# Patient Record
Sex: Female | Born: 1935 | Race: White | Hispanic: No | Smoking: Never smoker
Health system: Southern US, Community
[De-identification: ages and names within clinical notes are randomized; demographics above are authoritative.]

## PROBLEM LIST (undated history)

## (undated) DIAGNOSIS — L570 Actinic keratosis: Secondary | ICD-10-CM

## (undated) DIAGNOSIS — K219 Gastro-esophageal reflux disease without esophagitis: Secondary | ICD-10-CM

## (undated) DIAGNOSIS — N8110 Cystocele, unspecified: Secondary | ICD-10-CM

## (undated) DIAGNOSIS — M1990 Unspecified osteoarthritis, unspecified site: Secondary | ICD-10-CM

## (undated) DIAGNOSIS — I10 Essential (primary) hypertension: Secondary | ICD-10-CM

## (undated) HISTORY — PX: INCISION AND DRAINAGE / EXCISION THYROGLOSSAL CYST: SUR667

## (undated) HISTORY — PX: ABDOMINAL ADHESION SURGERY: SHX90

## (undated) HISTORY — PX: BREAST CYST ASPIRATION: SHX578

## (undated) HISTORY — PX: COLONOSCOPY: SHX174

## (undated) HISTORY — PX: HYSTEROSCOPY: SHX211

## (undated) HISTORY — PX: CYST EXCISION: SHX5701

## (undated) HISTORY — PX: UPPER GASTROINTESTINAL ENDOSCOPY: SHX188

## (undated) HISTORY — DX: Actinic keratosis: L57.0

## (undated) HISTORY — PX: CARPAL TUNNEL RELEASE: SHX101

---

## 2005-06-22 ENCOUNTER — Ambulatory Visit: Payer: Self-pay | Admitting: Unknown Physician Specialty

## 2005-12-01 ENCOUNTER — Inpatient Hospital Stay: Payer: Self-pay | Admitting: Unknown Physician Specialty

## 2005-12-10 ENCOUNTER — Ambulatory Visit: Payer: Self-pay | Admitting: Otolaryngology

## 2006-01-04 ENCOUNTER — Ambulatory Visit: Payer: Self-pay | Admitting: Unknown Physician Specialty

## 2006-07-02 ENCOUNTER — Ambulatory Visit: Payer: Self-pay | Admitting: Unknown Physician Specialty

## 2007-03-18 ENCOUNTER — Emergency Department: Payer: Self-pay | Admitting: Emergency Medicine

## 2007-04-23 ENCOUNTER — Ambulatory Visit: Payer: Self-pay | Admitting: Unknown Physician Specialty

## 2007-04-23 LAB — HM COLONOSCOPY

## 2007-04-28 ENCOUNTER — Ambulatory Visit: Payer: Self-pay | Admitting: Unknown Physician Specialty

## 2007-07-31 ENCOUNTER — Ambulatory Visit: Payer: Self-pay | Admitting: Unknown Physician Specialty

## 2008-08-24 ENCOUNTER — Ambulatory Visit: Payer: Self-pay | Admitting: Unknown Physician Specialty

## 2008-08-31 DIAGNOSIS — D229 Melanocytic nevi, unspecified: Secondary | ICD-10-CM

## 2008-08-31 HISTORY — DX: Melanocytic nevi, unspecified: D22.9

## 2009-01-11 ENCOUNTER — Ambulatory Visit: Payer: Self-pay | Admitting: Specialist

## 2009-08-25 ENCOUNTER — Ambulatory Visit: Payer: Self-pay | Admitting: Unknown Physician Specialty

## 2010-08-31 ENCOUNTER — Ambulatory Visit: Payer: Self-pay | Admitting: Unknown Physician Specialty

## 2011-03-12 LAB — LIPID PANEL
Cholesterol: 182 mg/dL (ref 0–200)
HDL: 100 mg/dL — AB (ref 35–70)
LDL CALC: 72 mg/dL
LDl/HDL Ratio: 0.7
Triglycerides: 49 mg/dL (ref 40–160)

## 2011-09-24 ENCOUNTER — Ambulatory Visit: Payer: Self-pay | Admitting: Unknown Physician Specialty

## 2012-02-13 DIAGNOSIS — H903 Sensorineural hearing loss, bilateral: Secondary | ICD-10-CM | POA: Diagnosis not present

## 2012-02-26 DIAGNOSIS — H903 Sensorineural hearing loss, bilateral: Secondary | ICD-10-CM | POA: Diagnosis not present

## 2012-03-29 ENCOUNTER — Inpatient Hospital Stay: Payer: Self-pay | Admitting: Surgery

## 2012-03-29 DIAGNOSIS — E860 Dehydration: Secondary | ICD-10-CM | POA: Diagnosis not present

## 2012-03-29 DIAGNOSIS — Z888 Allergy status to other drugs, medicaments and biological substances status: Secondary | ICD-10-CM | POA: Diagnosis not present

## 2012-03-29 DIAGNOSIS — K219 Gastro-esophageal reflux disease without esophagitis: Secondary | ICD-10-CM | POA: Diagnosis not present

## 2012-03-29 DIAGNOSIS — R109 Unspecified abdominal pain: Secondary | ICD-10-CM | POA: Diagnosis not present

## 2012-03-29 DIAGNOSIS — Z881 Allergy status to other antibiotic agents status: Secondary | ICD-10-CM | POA: Diagnosis not present

## 2012-03-29 DIAGNOSIS — K56609 Unspecified intestinal obstruction, unspecified as to partial versus complete obstruction: Secondary | ICD-10-CM | POA: Diagnosis not present

## 2012-03-29 DIAGNOSIS — R112 Nausea with vomiting, unspecified: Secondary | ICD-10-CM | POA: Diagnosis not present

## 2012-03-29 DIAGNOSIS — Z8249 Family history of ischemic heart disease and other diseases of the circulatory system: Secondary | ICD-10-CM | POA: Diagnosis not present

## 2012-03-29 DIAGNOSIS — I10 Essential (primary) hypertension: Secondary | ICD-10-CM | POA: Diagnosis not present

## 2012-03-29 LAB — URINALYSIS, COMPLETE
Bacteria: NONE SEEN
Glucose,UR: NEGATIVE mg/dL (ref 0–75)
Nitrite: NEGATIVE
Ph: 6 (ref 4.5–8.0)
Protein: NEGATIVE
RBC,UR: <1 /HPF (ref 0–5)
Specific Gravity: 1.008 (ref 1.003–1.030)
Transitional Epi: <1
WBC UR: 3 /HPF (ref 0–5)

## 2012-03-29 LAB — LIPASE, BLOOD: Lipase: 140 U/L (ref 73–393)

## 2012-03-29 LAB — COMPREHENSIVE METABOLIC PANEL
Albumin: 4.4 g/dL (ref 3.4–5.0)
Anion Gap: 11 (ref 7–16)
Bilirubin,Total: 1.9 mg/dL — ABNORMAL HIGH (ref 0.2–1.0)
Calcium, Total: 10.1 mg/dL (ref 8.5–10.1)
Chloride: 96 mmol/L — ABNORMAL LOW (ref 98–107)
Creatinine: 0.89 mg/dL (ref 0.60–1.30)
EGFR (African American): >60
EGFR (Non-African Amer.): >60
Potassium: 3.7 mmol/L (ref 3.5–5.1)
SGOT(AST): 27 U/L (ref 15–37)

## 2012-03-29 LAB — CBC
HCT: 42.4 % (ref 35.0–47.0)
MCH: 31 pg (ref 26.0–34.0)
Platelet: 243 10*3/uL (ref 150–440)
RBC: 4.74 10*6/uL (ref 3.80–5.20)

## 2012-03-29 LAB — CK TOTAL AND CKMB (NOT AT ARMC): CK, Total: 52 U/L (ref 21–215)

## 2012-03-30 LAB — BASIC METABOLIC PANEL
Anion Gap: 8 (ref 7–16)
BUN: 12 mg/dL (ref 7–18)
Co2: 27 mmol/L (ref 21–32)
EGFR (Non-African Amer.): >60
Potassium: 3.3 mmol/L — ABNORMAL LOW (ref 3.5–5.1)
Sodium: 137 mmol/L (ref 136–145)

## 2012-04-03 ENCOUNTER — Ambulatory Visit: Payer: Self-pay | Admitting: Surgery

## 2012-04-03 DIAGNOSIS — R109 Unspecified abdominal pain: Secondary | ICD-10-CM | POA: Diagnosis not present

## 2012-04-03 DIAGNOSIS — R112 Nausea with vomiting, unspecified: Secondary | ICD-10-CM | POA: Diagnosis not present

## 2012-04-07 DIAGNOSIS — R1084 Generalized abdominal pain: Secondary | ICD-10-CM | POA: Diagnosis not present

## 2012-04-10 DIAGNOSIS — K56609 Unspecified intestinal obstruction, unspecified as to partial versus complete obstruction: Secondary | ICD-10-CM | POA: Diagnosis not present

## 2012-04-10 DIAGNOSIS — M129 Arthropathy, unspecified: Secondary | ICD-10-CM | POA: Diagnosis not present

## 2012-04-10 DIAGNOSIS — I10 Essential (primary) hypertension: Secondary | ICD-10-CM | POA: Diagnosis not present

## 2012-04-10 DIAGNOSIS — K589 Irritable bowel syndrome without diarrhea: Secondary | ICD-10-CM | POA: Diagnosis not present

## 2012-05-22 DIAGNOSIS — L0201 Cutaneous abscess of face: Secondary | ICD-10-CM | POA: Diagnosis not present

## 2012-05-22 DIAGNOSIS — L03211 Cellulitis of face: Secondary | ICD-10-CM | POA: Diagnosis not present

## 2012-05-27 DIAGNOSIS — L03211 Cellulitis of face: Secondary | ICD-10-CM | POA: Diagnosis not present

## 2012-05-27 DIAGNOSIS — L0201 Cutaneous abscess of face: Secondary | ICD-10-CM | POA: Diagnosis not present

## 2012-06-09 DIAGNOSIS — J309 Allergic rhinitis, unspecified: Secondary | ICD-10-CM | POA: Diagnosis not present

## 2012-06-09 DIAGNOSIS — I10 Essential (primary) hypertension: Secondary | ICD-10-CM | POA: Diagnosis not present

## 2012-06-09 DIAGNOSIS — K589 Irritable bowel syndrome without diarrhea: Secondary | ICD-10-CM | POA: Diagnosis not present

## 2012-06-09 DIAGNOSIS — M1990 Unspecified osteoarthritis, unspecified site: Secondary | ICD-10-CM | POA: Diagnosis not present

## 2012-06-10 DIAGNOSIS — R1084 Generalized abdominal pain: Secondary | ICD-10-CM | POA: Diagnosis not present

## 2012-06-18 DIAGNOSIS — R1084 Generalized abdominal pain: Secondary | ICD-10-CM | POA: Diagnosis not present

## 2012-08-05 DIAGNOSIS — Z124 Encounter for screening for malignant neoplasm of cervix: Secondary | ICD-10-CM | POA: Diagnosis not present

## 2012-08-05 DIAGNOSIS — Z01419 Encounter for gynecological examination (general) (routine) without abnormal findings: Secondary | ICD-10-CM | POA: Diagnosis not present

## 2012-09-30 ENCOUNTER — Ambulatory Visit: Payer: Self-pay | Admitting: Family Medicine

## 2012-09-30 DIAGNOSIS — Z1231 Encounter for screening mammogram for malignant neoplasm of breast: Secondary | ICD-10-CM | POA: Diagnosis not present

## 2012-11-04 DIAGNOSIS — Z23 Encounter for immunization: Secondary | ICD-10-CM | POA: Diagnosis not present

## 2012-11-04 DIAGNOSIS — J309 Allergic rhinitis, unspecified: Secondary | ICD-10-CM | POA: Diagnosis not present

## 2012-11-04 DIAGNOSIS — K589 Irritable bowel syndrome without diarrhea: Secondary | ICD-10-CM | POA: Diagnosis not present

## 2012-11-04 DIAGNOSIS — Z0000 Encounter for general adult medical examination without abnormal findings: Secondary | ICD-10-CM | POA: Diagnosis not present

## 2012-11-04 DIAGNOSIS — M1990 Unspecified osteoarthritis, unspecified site: Secondary | ICD-10-CM | POA: Diagnosis not present

## 2012-11-19 DIAGNOSIS — H2510 Age-related nuclear cataract, unspecified eye: Secondary | ICD-10-CM | POA: Diagnosis not present

## 2012-11-28 DIAGNOSIS — K589 Irritable bowel syndrome without diarrhea: Secondary | ICD-10-CM | POA: Diagnosis not present

## 2012-11-28 DIAGNOSIS — Z23 Encounter for immunization: Secondary | ICD-10-CM | POA: Diagnosis not present

## 2012-11-28 DIAGNOSIS — R1032 Left lower quadrant pain: Secondary | ICD-10-CM | POA: Diagnosis not present

## 2013-03-17 DIAGNOSIS — L723 Sebaceous cyst: Secondary | ICD-10-CM | POA: Diagnosis not present

## 2013-03-17 DIAGNOSIS — L923 Foreign body granuloma of the skin and subcutaneous tissue: Secondary | ICD-10-CM | POA: Diagnosis not present

## 2013-04-11 IMAGING — CT CT ABD-PELV W/O CM
1 of 2 series · 15 of 32 positions shown, 19 images · non-contrast
Comparison: None

REASON FOR EXAM: (1) diffuse abdominal pain, nausea/vomiting.  History of
obstructions and cannot
COMMENTS:

PROCEDURE:     CT  - CT ABDOMEN AND PELVIS W[DATE]  [DATE]
RESULT:     Indication: Abdominal pain
TECHNIQUE: Multiple axial images from the lung bases to the symphysis pubis
were obtained without oral and without intravenous contrast.

[Series 2: 3mm soft tissue · axial · 0.68mm/px · z∈[-456,-72]mm · 15 of 140 slices shown, 19 images]
[im 6/140  soft-tissue]
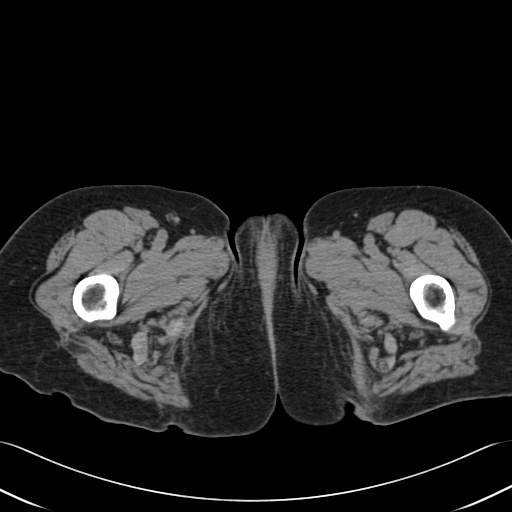
[im 6/140  bone]
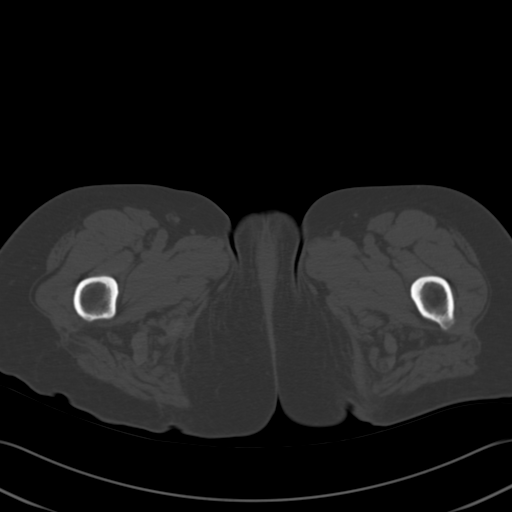
[im 18/140  soft-tissue]
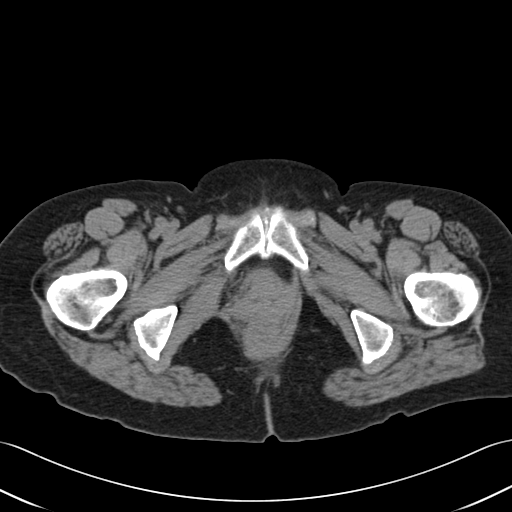
[im 29/140  soft-tissue]
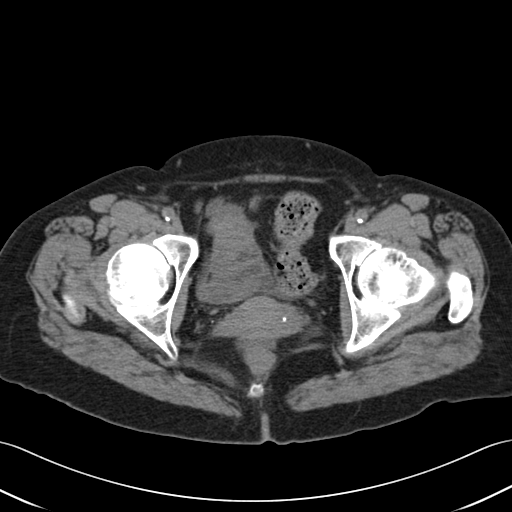
[im 41/140  soft-tissue]
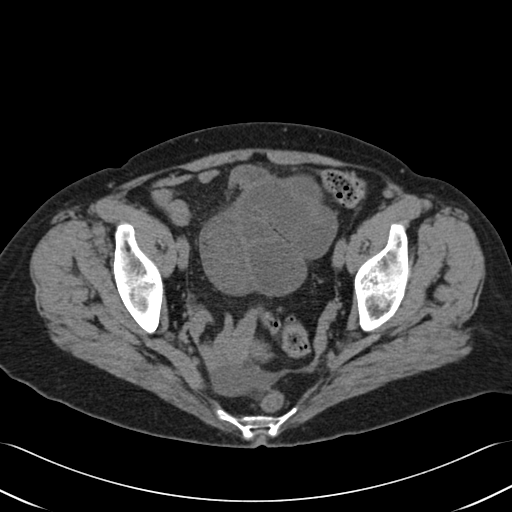
[im 47/140  soft-tissue]
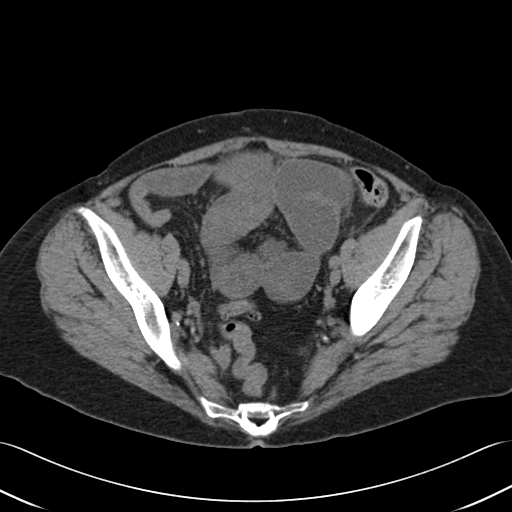
[im 58/140  soft-tissue]
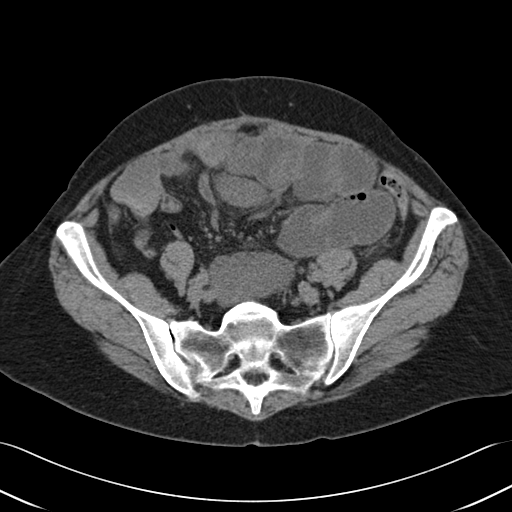
[im 70/140  soft-tissue]
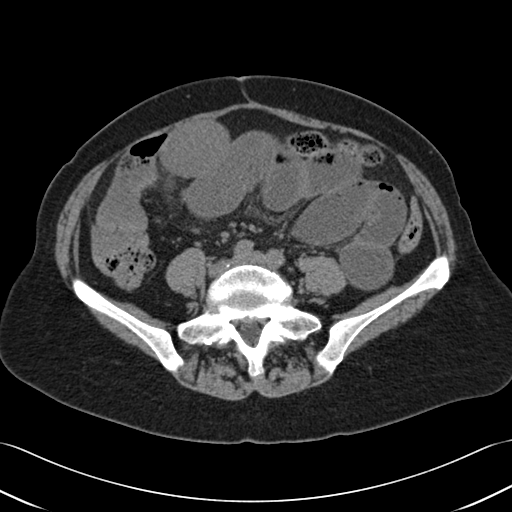
[im 82/140  soft-tissue]
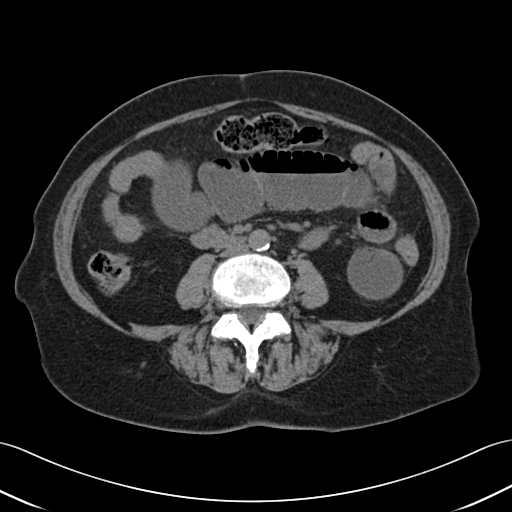
[im 93/140  soft-tissue]
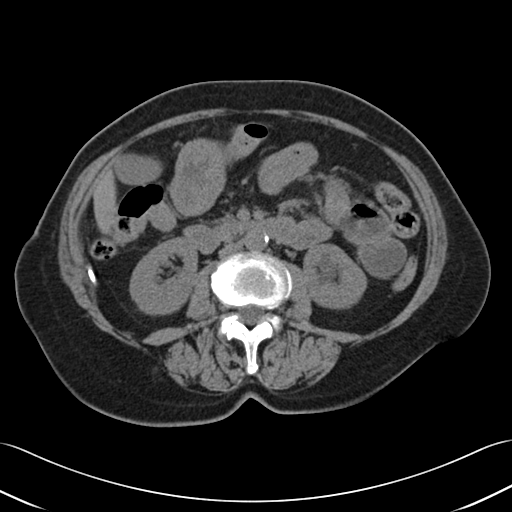
[im 93/140  bone]
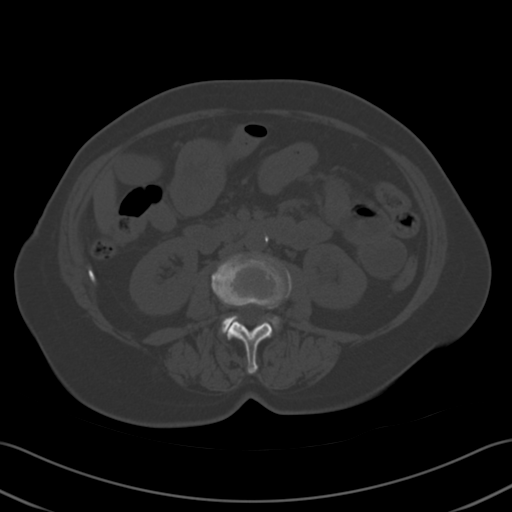
[im 99/140  soft-tissue]
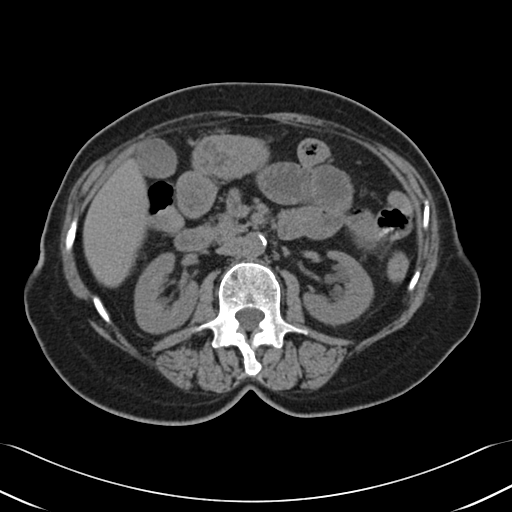
[im 111/140  soft-tissue]
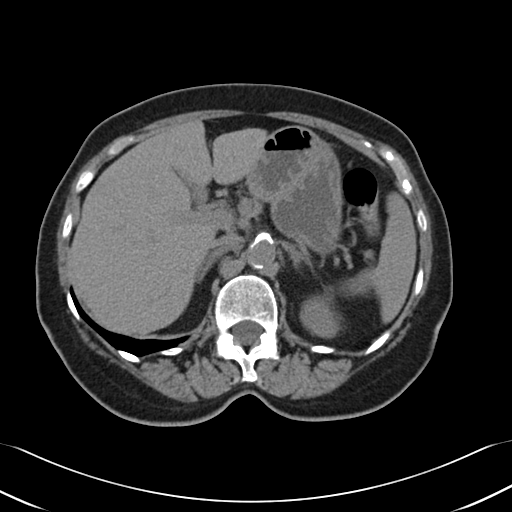
[im 116/140  lung]
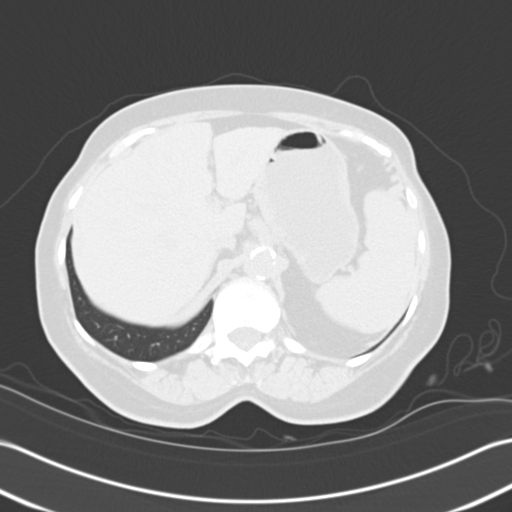
[im 122/140  soft-tissue]
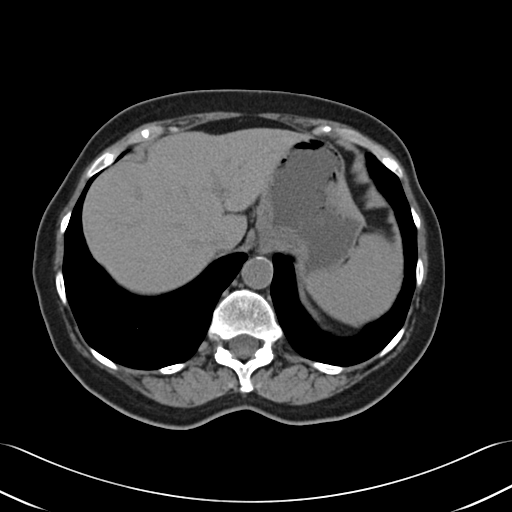
[im 122/140  lung]
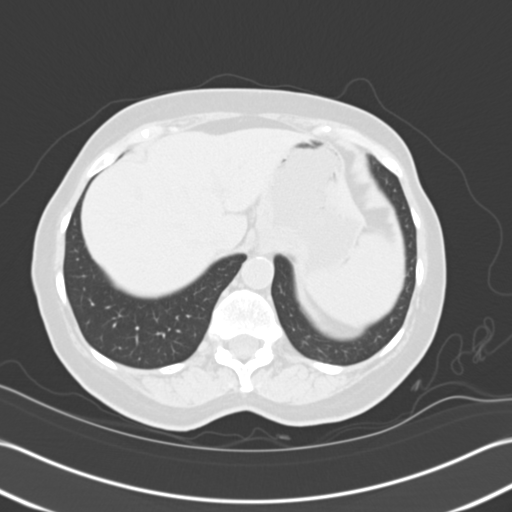
[im 128/140  lung]
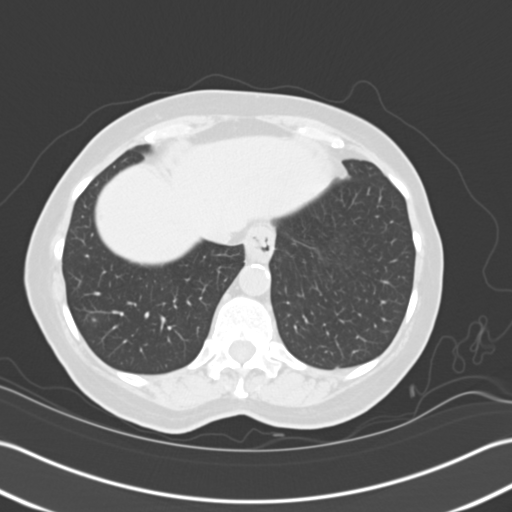
[im 134/140  soft-tissue]
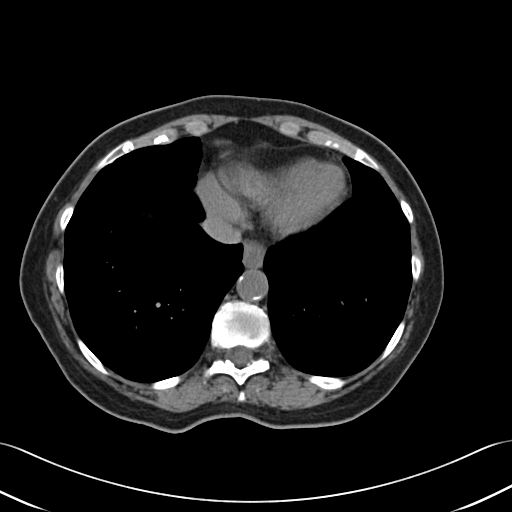
[im 134/140  lung]
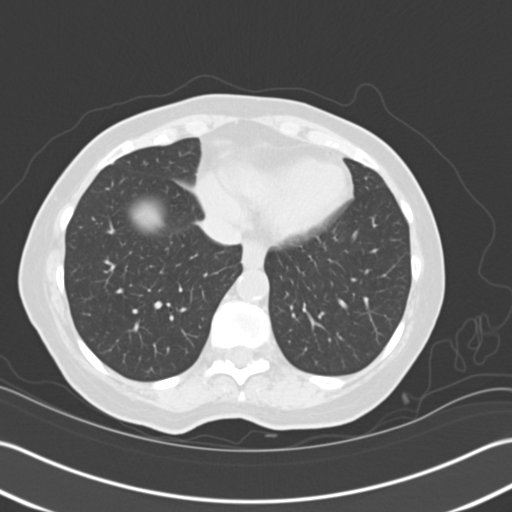

[15 of 32 positions shown; findings below may reference images not displayed]

FINDINGS: The lung bases are clear. There is no pleural or pericardial effusions.

No renal, ureteral, or bladder calculi. No obstructive uropathy. No
perinephric stranding is seen. There are left parapelvic cysts. The kidneys
are symmetric in size without evidence for exophytic mass. The bladder is
unremarkable.

The liver demonstrates no focal abnormality. The gallbladder is
unremarkable. The spleen demonstrates no focal abnormality. The adrenal
glands and pancreas are normal.

There is a small bowel dilatation measuring up to 4.2 cm with multiple
air-fluid levels and fluid filled loops of small bowel. There is no definite
transition point. The distal ileum is decompressed. There is no
pneumoperitoneum, pneumatosis, or portal venous gas. There is a small amount
of pelvic free fluid. There is no lymphadenopathy.

The abdominal aorta is normal in caliber .

The osseous structures are unremarkable.
IMPRESSION: 1. Small bowel dilatation with decompressed distal ileum most concerning for
a high-grade bowel obstruction.

[REDACTED]

## 2013-04-16 IMAGING — CR DG SMALL BOWEL
1 series · 9 of 9 positions shown · non-contrast
Comparison: none

REASON FOR EXAM: Abd Pain Nausea Vomiting
COMMENTS:

PROCEDURE:     FL  - FL SMALL BOWEL  - April 03, 2012 [DATE]
RESULT:     Indication: Nausea vomiting

[Series 1: run · 9 of 9 slices shown]
[im 1/9]
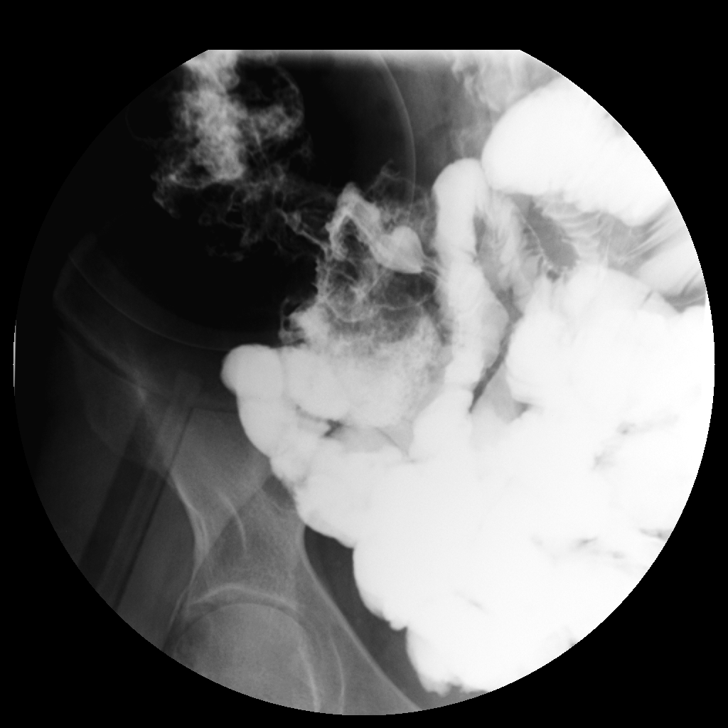
[im 2/9]
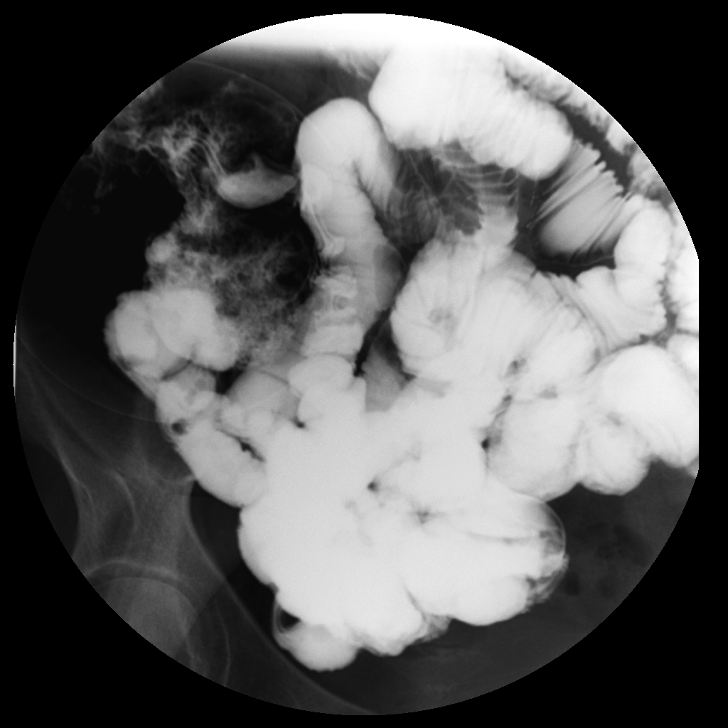
[im 3/9]
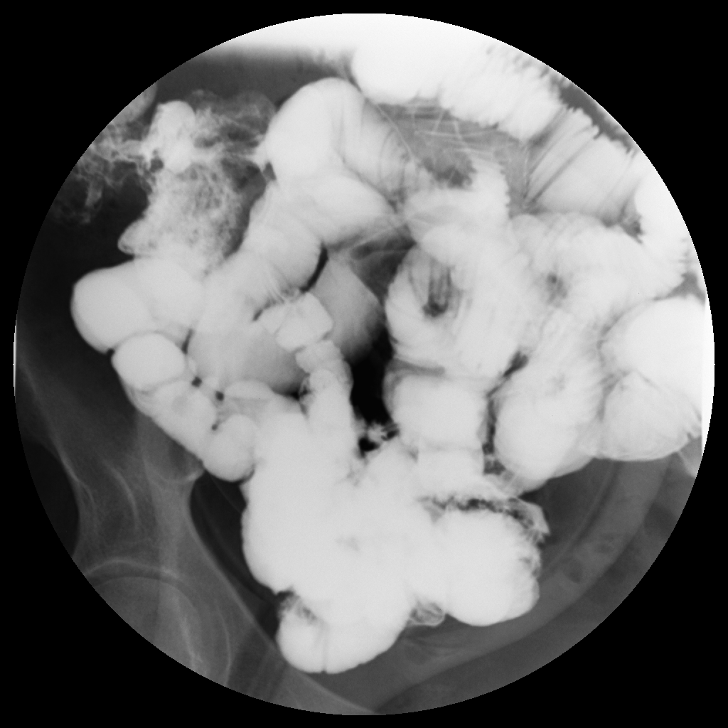
[im 4/9]
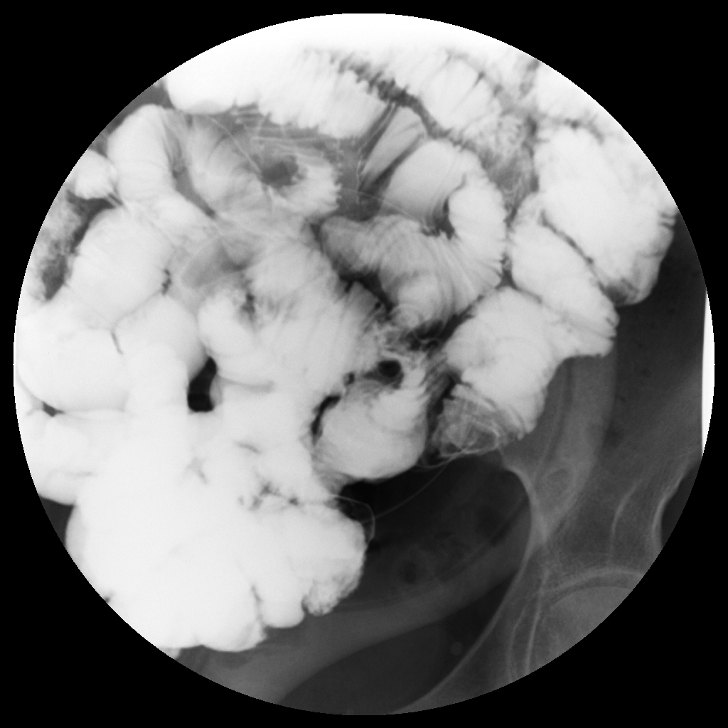
[im 5/9]
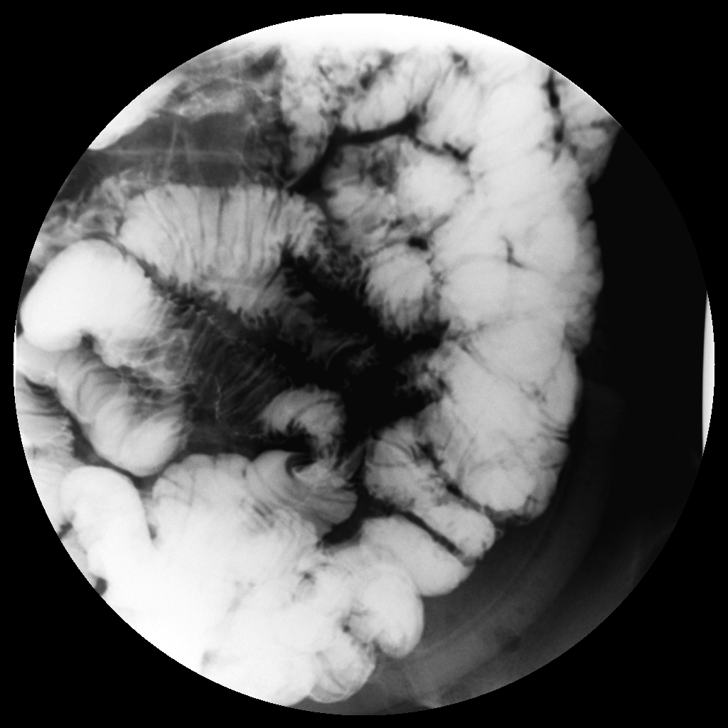
[im 6/9]
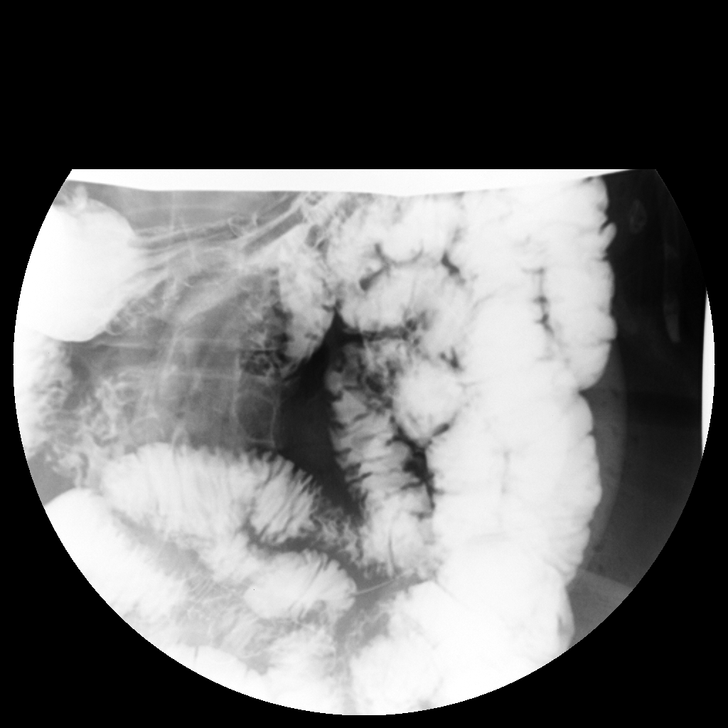
[im 7/9]
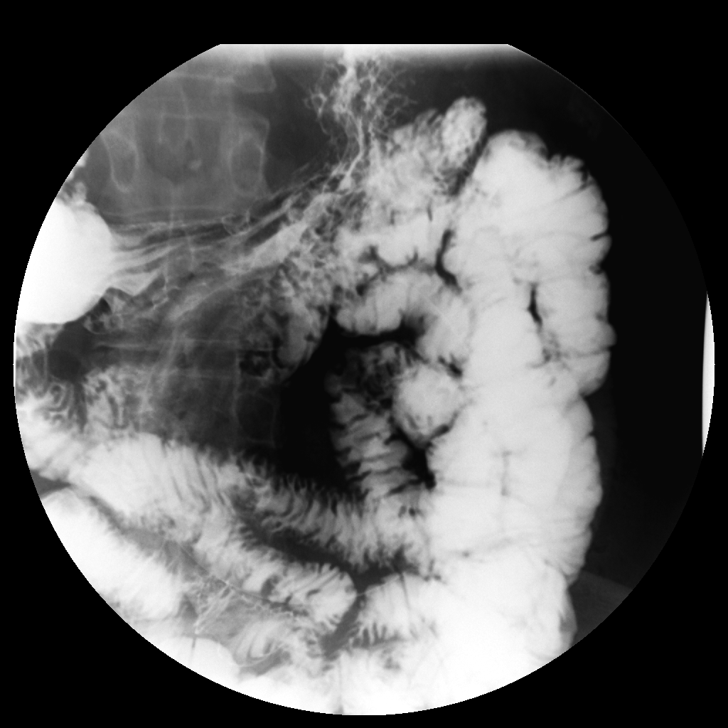
[im 8/9]
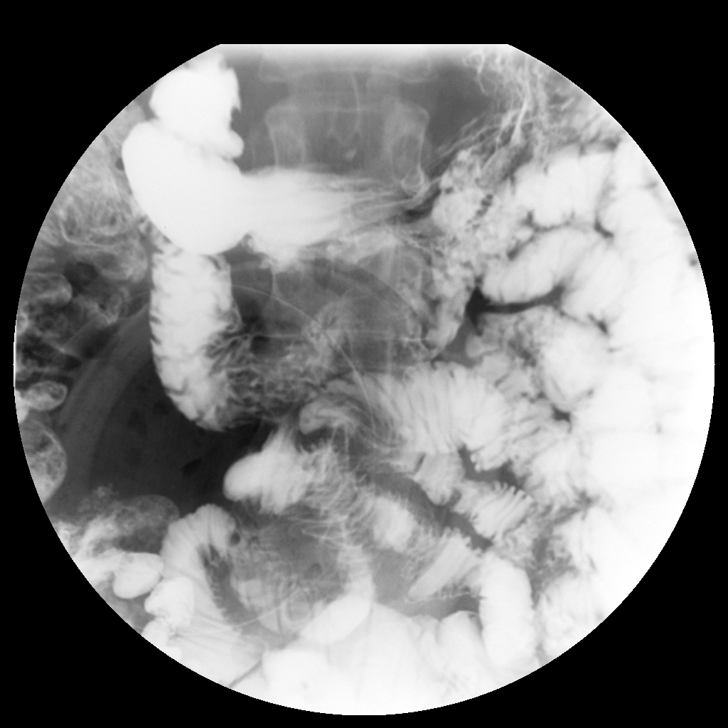
[im 9/9]
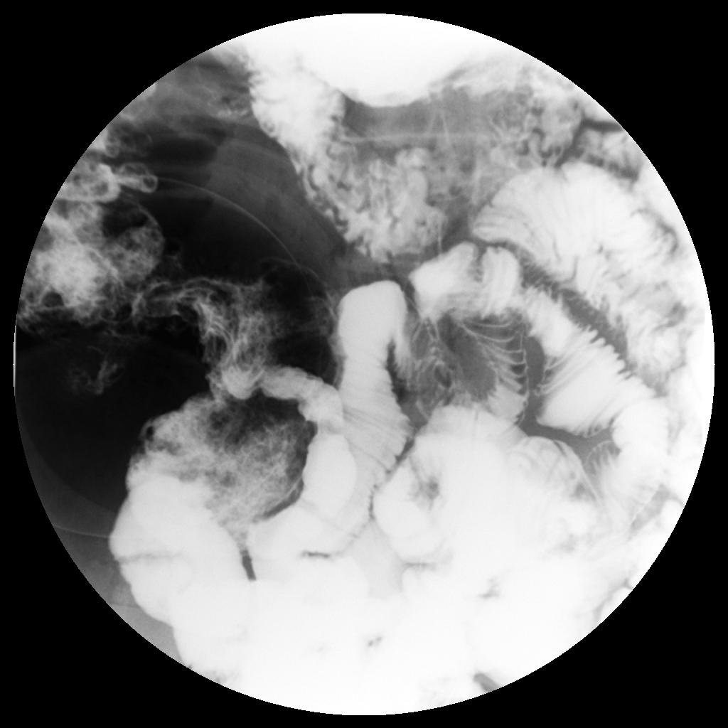

[9 of 9 positions shown; findings below may reference images not displayed]

FINDINGS: Scout frontal abdominal and pelvic radiograph demonstrates no bowel
dilatation.

Medium density barium was periodically observed under fluoroscopy to travel
from the stomach to the ascending colon (over a 90 minute time period).
There is no evidence of small bowel stricture or obstruction. No large
filling defects to suggest mass lesion. In addition, there is no evidence of
tethering or definite inflammatory changes present within the small bowel.
IMPRESSION: Normal small bowel follow-through study without evidence of tethering, mass
lesion, or obstruction within the small bowel.

[REDACTED]

## 2013-05-04 DIAGNOSIS — R51 Headache: Secondary | ICD-10-CM | POA: Diagnosis not present

## 2013-05-04 DIAGNOSIS — I10 Essential (primary) hypertension: Secondary | ICD-10-CM | POA: Diagnosis not present

## 2013-05-04 DIAGNOSIS — K589 Irritable bowel syndrome without diarrhea: Secondary | ICD-10-CM | POA: Diagnosis not present

## 2013-05-04 DIAGNOSIS — M129 Arthropathy, unspecified: Secondary | ICD-10-CM | POA: Diagnosis not present

## 2013-05-14 DIAGNOSIS — M76829 Posterior tibial tendinitis, unspecified leg: Secondary | ICD-10-CM | POA: Diagnosis not present

## 2013-07-15 DIAGNOSIS — D485 Neoplasm of uncertain behavior of skin: Secondary | ICD-10-CM | POA: Diagnosis not present

## 2013-07-15 DIAGNOSIS — L723 Sebaceous cyst: Secondary | ICD-10-CM | POA: Diagnosis not present

## 2013-08-02 ENCOUNTER — Emergency Department: Payer: Self-pay | Admitting: Emergency Medicine

## 2013-08-02 DIAGNOSIS — R112 Nausea with vomiting, unspecified: Secondary | ICD-10-CM | POA: Diagnosis not present

## 2013-08-02 DIAGNOSIS — I10 Essential (primary) hypertension: Secondary | ICD-10-CM | POA: Diagnosis not present

## 2013-08-02 DIAGNOSIS — Z79899 Other long term (current) drug therapy: Secondary | ICD-10-CM | POA: Diagnosis not present

## 2013-08-02 DIAGNOSIS — R1084 Generalized abdominal pain: Secondary | ICD-10-CM | POA: Diagnosis not present

## 2013-08-02 DIAGNOSIS — R109 Unspecified abdominal pain: Secondary | ICD-10-CM | POA: Diagnosis not present

## 2013-08-02 LAB — COMPREHENSIVE METABOLIC PANEL
Albumin: 4.6 g/dL (ref 3.4–5.0)
Anion Gap: 9 (ref 7–16)
BUN: 22 mg/dL — ABNORMAL HIGH (ref 7–18)
Calcium, Total: 10.9 mg/dL — ABNORMAL HIGH (ref 8.5–10.1)
Chloride: 100 mmol/L (ref 98–107)
Creatinine: 0.88 mg/dL (ref 0.60–1.30)
Osmolality: 279 (ref 275–301)
Potassium: 4.3 mmol/L (ref 3.5–5.1)
SGPT (ALT): 22 U/L (ref 12–78)
Total Protein: 8.7 g/dL — ABNORMAL HIGH (ref 6.4–8.2)

## 2013-08-02 LAB — CBC
HGB: 15 g/dL (ref 12.0–16.0)
MCH: 31.2 pg (ref 26.0–34.0)
MCHC: 35.5 g/dL (ref 32.0–36.0)
MCV: 88 fL (ref 80–100)
Platelet: 254 10*3/uL (ref 150–440)
RBC: 4.8 10*6/uL (ref 3.80–5.20)
WBC: 13.9 10*3/uL — ABNORMAL HIGH (ref 3.6–11.0)

## 2013-08-04 DIAGNOSIS — I789 Disease of capillaries, unspecified: Secondary | ICD-10-CM | POA: Diagnosis not present

## 2013-08-04 DIAGNOSIS — L578 Other skin changes due to chronic exposure to nonionizing radiation: Secondary | ICD-10-CM | POA: Diagnosis not present

## 2013-08-04 DIAGNOSIS — Z85828 Personal history of other malignant neoplasm of skin: Secondary | ICD-10-CM | POA: Diagnosis not present

## 2013-08-04 DIAGNOSIS — L820 Inflamed seborrheic keratosis: Secondary | ICD-10-CM | POA: Diagnosis not present

## 2013-08-04 DIAGNOSIS — D239 Other benign neoplasm of skin, unspecified: Secondary | ICD-10-CM | POA: Diagnosis not present

## 2013-08-04 DIAGNOSIS — D1800 Hemangioma unspecified site: Secondary | ICD-10-CM | POA: Diagnosis not present

## 2013-08-04 DIAGNOSIS — I679 Cerebrovascular disease, unspecified: Secondary | ICD-10-CM | POA: Diagnosis not present

## 2013-08-24 DIAGNOSIS — Z01419 Encounter for gynecological examination (general) (routine) without abnormal findings: Secondary | ICD-10-CM | POA: Diagnosis not present

## 2013-08-24 DIAGNOSIS — Z1211 Encounter for screening for malignant neoplasm of colon: Secondary | ICD-10-CM | POA: Diagnosis not present

## 2013-08-25 DIAGNOSIS — Z01419 Encounter for gynecological examination (general) (routine) without abnormal findings: Secondary | ICD-10-CM | POA: Diagnosis not present

## 2013-10-01 ENCOUNTER — Ambulatory Visit: Payer: Self-pay | Admitting: Unknown Physician Specialty

## 2013-10-01 DIAGNOSIS — Z1231 Encounter for screening mammogram for malignant neoplasm of breast: Secondary | ICD-10-CM | POA: Diagnosis not present

## 2013-10-14 DIAGNOSIS — I10 Essential (primary) hypertension: Secondary | ICD-10-CM | POA: Diagnosis not present

## 2013-10-14 DIAGNOSIS — Z23 Encounter for immunization: Secondary | ICD-10-CM | POA: Diagnosis not present

## 2013-10-14 DIAGNOSIS — R51 Headache: Secondary | ICD-10-CM | POA: Diagnosis not present

## 2013-10-14 DIAGNOSIS — K589 Irritable bowel syndrome without diarrhea: Secondary | ICD-10-CM | POA: Diagnosis not present

## 2013-10-14 DIAGNOSIS — M129 Arthropathy, unspecified: Secondary | ICD-10-CM | POA: Diagnosis not present

## 2013-11-10 DIAGNOSIS — R51 Headache: Secondary | ICD-10-CM | POA: Diagnosis not present

## 2013-11-10 DIAGNOSIS — Z0000 Encounter for general adult medical examination without abnormal findings: Secondary | ICD-10-CM | POA: Diagnosis not present

## 2013-11-10 DIAGNOSIS — Z1339 Encounter for screening examination for other mental health and behavioral disorders: Secondary | ICD-10-CM | POA: Diagnosis not present

## 2013-11-10 DIAGNOSIS — Z1331 Encounter for screening for depression: Secondary | ICD-10-CM | POA: Diagnosis not present

## 2013-11-10 DIAGNOSIS — Z23 Encounter for immunization: Secondary | ICD-10-CM | POA: Diagnosis not present

## 2013-11-10 DIAGNOSIS — K589 Irritable bowel syndrome without diarrhea: Secondary | ICD-10-CM | POA: Diagnosis not present

## 2013-11-10 DIAGNOSIS — I10 Essential (primary) hypertension: Secondary | ICD-10-CM | POA: Diagnosis not present

## 2013-11-13 DIAGNOSIS — H16219 Exposure keratoconjunctivitis, unspecified eye: Secondary | ICD-10-CM | POA: Diagnosis not present

## 2013-12-02 DIAGNOSIS — M6740 Ganglion, unspecified site: Secondary | ICD-10-CM | POA: Diagnosis not present

## 2013-12-10 DIAGNOSIS — H2510 Age-related nuclear cataract, unspecified eye: Secondary | ICD-10-CM | POA: Diagnosis not present

## 2014-05-19 DIAGNOSIS — I10 Essential (primary) hypertension: Secondary | ICD-10-CM | POA: Diagnosis not present

## 2014-05-19 DIAGNOSIS — Z23 Encounter for immunization: Secondary | ICD-10-CM | POA: Diagnosis not present

## 2014-05-19 DIAGNOSIS — K2100 Gastro-esophageal reflux disease with esophagitis, without bleeding: Secondary | ICD-10-CM | POA: Diagnosis not present

## 2014-05-19 DIAGNOSIS — Z79899 Other long term (current) drug therapy: Secondary | ICD-10-CM | POA: Diagnosis not present

## 2014-05-19 DIAGNOSIS — E041 Nontoxic single thyroid nodule: Secondary | ICD-10-CM | POA: Diagnosis not present

## 2014-07-16 DIAGNOSIS — H0019 Chalazion unspecified eye, unspecified eyelid: Secondary | ICD-10-CM | POA: Diagnosis not present

## 2014-08-05 DIAGNOSIS — D485 Neoplasm of uncertain behavior of skin: Secondary | ICD-10-CM | POA: Diagnosis not present

## 2014-08-05 DIAGNOSIS — L578 Other skin changes due to chronic exposure to nonionizing radiation: Secondary | ICD-10-CM | POA: Diagnosis not present

## 2014-08-05 DIAGNOSIS — L820 Inflamed seborrheic keratosis: Secondary | ICD-10-CM | POA: Diagnosis not present

## 2014-08-05 DIAGNOSIS — Z1283 Encounter for screening for malignant neoplasm of skin: Secondary | ICD-10-CM | POA: Diagnosis not present

## 2014-08-05 DIAGNOSIS — D239 Other benign neoplasm of skin, unspecified: Secondary | ICD-10-CM | POA: Diagnosis not present

## 2014-08-05 DIAGNOSIS — L570 Actinic keratosis: Secondary | ICD-10-CM | POA: Diagnosis not present

## 2014-08-05 DIAGNOSIS — Z85828 Personal history of other malignant neoplasm of skin: Secondary | ICD-10-CM | POA: Diagnosis not present

## 2014-09-07 DIAGNOSIS — N814 Uterovaginal prolapse, unspecified: Secondary | ICD-10-CM | POA: Diagnosis not present

## 2014-09-07 DIAGNOSIS — Z1211 Encounter for screening for malignant neoplasm of colon: Secondary | ICD-10-CM | POA: Diagnosis not present

## 2014-09-07 DIAGNOSIS — Z01419 Encounter for gynecological examination (general) (routine) without abnormal findings: Secondary | ICD-10-CM | POA: Diagnosis not present

## 2014-09-07 DIAGNOSIS — Z124 Encounter for screening for malignant neoplasm of cervix: Secondary | ICD-10-CM | POA: Diagnosis not present

## 2014-09-20 DIAGNOSIS — H6502 Acute serous otitis media, left ear: Secondary | ICD-10-CM | POA: Diagnosis not present

## 2014-09-20 DIAGNOSIS — J0140 Acute pansinusitis, unspecified: Secondary | ICD-10-CM | POA: Diagnosis not present

## 2014-09-20 DIAGNOSIS — L03211 Cellulitis of face: Secondary | ICD-10-CM | POA: Diagnosis not present

## 2014-09-20 DIAGNOSIS — R0981 Nasal congestion: Secondary | ICD-10-CM | POA: Diagnosis not present

## 2014-09-28 ENCOUNTER — Emergency Department: Payer: Self-pay | Admitting: Emergency Medicine

## 2014-09-28 DIAGNOSIS — G40909 Epilepsy, unspecified, not intractable, without status epilepticus: Secondary | ICD-10-CM | POA: Diagnosis not present

## 2014-09-28 DIAGNOSIS — Z7982 Long term (current) use of aspirin: Secondary | ICD-10-CM | POA: Diagnosis not present

## 2014-09-28 DIAGNOSIS — R569 Unspecified convulsions: Secondary | ICD-10-CM | POA: Diagnosis not present

## 2014-09-28 DIAGNOSIS — I10 Essential (primary) hypertension: Secondary | ICD-10-CM | POA: Diagnosis not present

## 2014-09-28 DIAGNOSIS — Z880 Allergy status to penicillin: Secondary | ICD-10-CM | POA: Diagnosis not present

## 2014-09-28 DIAGNOSIS — R42 Dizziness and giddiness: Secondary | ICD-10-CM | POA: Diagnosis not present

## 2014-09-29 DIAGNOSIS — R569 Unspecified convulsions: Secondary | ICD-10-CM | POA: Diagnosis not present

## 2014-09-29 LAB — URINALYSIS, COMPLETE
BILIRUBIN, UR: NEGATIVE
BLOOD: NEGATIVE
Bacteria: NONE SEEN
GLUCOSE, UR: NEGATIVE mg/dL (ref 0–75)
Ketone: NEGATIVE
Leukocyte Esterase: NEGATIVE
Nitrite: NEGATIVE
Ph: 6 (ref 4.5–8.0)
Protein: NEGATIVE
RBC, UR: NONE SEEN /HPF (ref 0–5)
SPECIFIC GRAVITY: 1.005 (ref 1.003–1.030)
Squamous Epithelial: <1

## 2014-09-29 LAB — COMPREHENSIVE METABOLIC PANEL
Albumin: 3.5 g/dL (ref 3.4–5.0)
Alkaline Phosphatase: 72 U/L
Anion Gap: 11 (ref 7–16)
BUN: 22 mg/dL — ABNORMAL HIGH (ref 7–18)
Bilirubin,Total: 1.1 mg/dL — ABNORMAL HIGH (ref 0.2–1.0)
Calcium, Total: 8.7 mg/dL (ref 8.5–10.1)
Chloride: 102 mmol/L (ref 98–107)
Co2: 26 mmol/L (ref 21–32)
Creatinine: 1.1 mg/dL (ref 0.60–1.30)
EGFR (African American): >60
EGFR (Non-African Amer.): 51 — ABNORMAL LOW
Glucose: 123 mg/dL — ABNORMAL HIGH (ref 65–99)
Osmolality: 282 (ref 275–301)
Potassium: 3.5 mmol/L (ref 3.5–5.1)
SGOT(AST): 24 U/L (ref 15–37)
SGPT (ALT): 24 U/L
Sodium: 139 mmol/L (ref 136–145)
Total Protein: 6.9 g/dL (ref 6.4–8.2)

## 2014-09-29 LAB — CBC
HCT: 40 % (ref 35.0–47.0)
HGB: 13.2 g/dL (ref 12.0–16.0)
MCH: 30.1 pg (ref 26.0–34.0)
MCHC: 33.1 g/dL (ref 32.0–36.0)
MCV: 91 fL (ref 80–100)
Platelet: 227 10*3/uL (ref 150–440)
RBC: 4.39 10*6/uL (ref 3.80–5.20)
RDW: 13 % (ref 11.5–14.5)
WBC: 9.9 10*3/uL (ref 3.6–11.0)

## 2014-09-29 LAB — TROPONIN I: Troponin-I: <0.02 ng/mL

## 2014-09-29 LAB — ETHANOL: ETHANOL LVL: 37 mg/dL

## 2014-10-01 DIAGNOSIS — I10 Essential (primary) hypertension: Secondary | ICD-10-CM | POA: Diagnosis not present

## 2014-10-01 DIAGNOSIS — R55 Syncope and collapse: Secondary | ICD-10-CM | POA: Diagnosis not present

## 2014-10-01 DIAGNOSIS — H1131 Conjunctival hemorrhage, right eye: Secondary | ICD-10-CM | POA: Diagnosis not present

## 2014-10-01 DIAGNOSIS — Z658 Other specified problems related to psychosocial circumstances: Secondary | ICD-10-CM | POA: Diagnosis not present

## 2014-10-01 DIAGNOSIS — Z7189 Other specified counseling: Secondary | ICD-10-CM | POA: Diagnosis not present

## 2014-10-04 DIAGNOSIS — H903 Sensorineural hearing loss, bilateral: Secondary | ICD-10-CM | POA: Diagnosis not present

## 2014-10-04 DIAGNOSIS — H6500 Acute serous otitis media, unspecified ear: Secondary | ICD-10-CM | POA: Diagnosis not present

## 2014-10-04 DIAGNOSIS — H6980 Other specified disorders of Eustachian tube, unspecified ear: Secondary | ICD-10-CM | POA: Diagnosis not present

## 2014-10-06 ENCOUNTER — Ambulatory Visit: Payer: Self-pay | Admitting: Unknown Physician Specialty

## 2014-10-06 DIAGNOSIS — Z1231 Encounter for screening mammogram for malignant neoplasm of breast: Secondary | ICD-10-CM | POA: Diagnosis not present

## 2014-10-12 DIAGNOSIS — N814 Uterovaginal prolapse, unspecified: Secondary | ICD-10-CM | POA: Diagnosis not present

## 2014-10-12 DIAGNOSIS — N8111 Cystocele, midline: Secondary | ICD-10-CM | POA: Diagnosis not present

## 2014-10-29 DIAGNOSIS — R569 Unspecified convulsions: Secondary | ICD-10-CM | POA: Diagnosis not present

## 2014-10-29 DIAGNOSIS — K210 Gastro-esophageal reflux disease with esophagitis: Secondary | ICD-10-CM | POA: Diagnosis not present

## 2014-10-29 DIAGNOSIS — K589 Irritable bowel syndrome without diarrhea: Secondary | ICD-10-CM | POA: Diagnosis not present

## 2014-11-16 DIAGNOSIS — Z23 Encounter for immunization: Secondary | ICD-10-CM | POA: Diagnosis not present

## 2014-11-16 DIAGNOSIS — R569 Unspecified convulsions: Secondary | ICD-10-CM | POA: Diagnosis not present

## 2014-11-16 DIAGNOSIS — Z0000 Encounter for general adult medical examination without abnormal findings: Secondary | ICD-10-CM | POA: Diagnosis not present

## 2014-11-16 DIAGNOSIS — K210 Gastro-esophageal reflux disease with esophagitis: Secondary | ICD-10-CM | POA: Diagnosis not present

## 2015-02-17 LAB — HEPATIC FUNCTION PANEL
ALK PHOS: 71 U/L (ref 25–125)
ALT: 12 U/L (ref 7–35)
AST: 17 U/L (ref 13–35)

## 2015-02-17 LAB — CBC AND DIFFERENTIAL
HCT: 38 % (ref 36–46)
HEMOGLOBIN: 13 g/dL (ref 12.0–16.0)
Neutrophils Absolute: 2 /uL
PLATELETS: 198 10*3/uL (ref 150–399)
WBC: 4.7 10*3/mL

## 2015-02-17 LAB — TSH: TSH: 5.24 u[IU]/mL (ref 0.41–5.90)

## 2015-02-17 LAB — BASIC METABOLIC PANEL
BUN: 23 mg/dL — AB (ref 4–21)
CREATININE: 0.8 mg/dL (ref 0.5–1.1)
Glucose: 90 mg/dL
POTASSIUM: 4.1 mmol/L (ref 3.4–5.3)
Sodium: 138 mmol/L (ref 137–147)

## 2015-03-02 ENCOUNTER — Ambulatory Visit: Payer: Self-pay | Admitting: Family Medicine

## 2015-03-27 NOTE — H&P (Signed)
PATIENT NAME:  Bianca Malone, Bianca Malone MR#:  [id number] DATE OF BIRTH:  [birth year]  DATE OF ADMISSION:  03/29/2012  HISTORY OF PRESENT ILLNESS: This 80 year old female came into the emergency room with the chief complaint of crampy abdominal pain. She points to the periumbilical area as the site of her pain. She reports it began yesterday at approximately 9:00 a.m. and was crampy and intermittent during the course of the day and the night, also has had multiple episodes of nausea and vomiting. Initially it appeared that she vomited up all of what she had for breakfast yesterday morning and subsequently has continued to vomit up smaller amounts. She has not seen any bile and also has not seen any blood. She came into the emergency room where she was initially evaluated by the emergency room staff and was studied with CT scanning which demonstrated what appeared to be a small bowel obstruction. Also she had a nasogastric tube inserted which has had minimal amount of drainage.   On further questioning, she reports she does have regular bowel movements. She has no problems with constipation or diarrhea or rectal bleeding. She has had a bowel movement since the onset of her pain and has passed small amounts of gas. She reports no associated chills or fever, no radiation of her pain, and no back pain. She has had no specific weakness or dizziness, but somewhat fatigued.  PAST MEDICAL HISTORY:  1. Hypertension for which she is on treatment. 2. History of chronic gastroesophageal reflux for which she is on treatment.  3. She reports no history of heart disease, lung disease, hepatitis, or diabetes. 4. She reports she does have chronic intermittent episodes of periumbilical abdominal pain which has been going on for a number of years and does take phenobarbital from time to time for these episodes of pain.   PAST SURGICAL HISTORY:  1. Colonoscopy in the past and was told she had a very tortuous colon.  2. Laparotomy due to  small bowel obstruction approximately 10 years ago and had lysis of adhesions. There was no resection. 3. Some type of surgery in her neck which she relates to her thyroid gland, all her symptoms resolved with surgery.    MEDICATIONS:  1. Cozaar 100 mg daily.  2. Prilosec 20 mg daily.  3. Phenobarbital unknown dose, takes p.r.n. 4. MiraLax daily.   DRUG ALLERGIES: Amoxicillin and Daypro.   FAMILY HISTORY: Positive for heart disease, peripheral vascular disease, and dementia.   SOCIAL HISTORY: She is accompanied by her husband. She does not smoke and does occasionally drink some wine.   REVIEW OF SYSTEMS: She reports she did have a cold just a few weeks ago, just some sinus and nasal congestion and minimal cough and that resolved. She reports no recent acute illness. No visual or auditory problems. She reports no difficulty swallowing. She does occasionally have heartburn. No chest pain. No dyspnea on exertion. She reports no urinary symptoms. No vaginal discharge. No ankle edema. No recent sores or boils. Review of systems is otherwise negative.   PHYSICAL EXAMINATION:   VITAL SIGNS: Temperature 96, pulse 90, blood pressure 146/58, respirations 20, and oxygen saturation 98%.   GENERAL: She is awake, alert, and oriented, resting on the ER stretcher. Has a nasogastric tube in place which is on intermittent suction and has just some clear, slightly greenish fluid effluxing.   SKIN: Warm and dry without rash.   HEENT: Pupils are equal and reactive to light. Extraocular movements are intact. Sclerae clear.  Palpebral conjunctivae normal red color. Pharynx clear.   NECK: No palpable mass.   LUNGS: Lungs sounds were clear. No respiratory distress.   HEART: Regular rhythm, S1 and S2 without murmur.   ABDOMEN: Minimal evidence of distention. Bowel sounds are present. There is minimal degree of periumbilical tenderness. There is no palpable mass. No hepatomegaly. No palpable hernia.    RECTAL: Rectal exam demonstrates good sphincter tone. Can palpate the cervix which is nontender. No palpable rectal mass.   EXTREMITIES: No dependent edema.   NEUROLOGIC: Awake, alert, and oriented, and moving all extremities.   LABS/STUDIES: I reviewed her CT images which do demonstrate distention of the proximal and mid aspect of the small bowel and also appears that the terminal ileum is normal caliber.   BUN 20, creatinine 0.89, sodium slightly low at 133, and total bilirubin 1.9. CK and troponin are normal. White blood count 12,000 and hemoglobin 14.7.   IMPRESSION: Small bowel obstruction.   PLAN: I recommend admission to the hospital, IV fluids, continue her nasogastric suction and give her some intravenous pantoprazole, some morphine as needed, nausea medicine if needed, subcutaneous minidose heparin below-knee, and TED stockings.   We are going to give some time for observation, get a flat and upright x-ray of the abdomen tomorrow, and give her a soapsuds enema today. So we will follow and see if this obstruction resolves. I did discuss she may potentially need laparotomy to correct small bowel obstruction.  ____________________________ Lenna Sciara. Rochel Brome, MD [email or web address] D: 03/29/2012 10:27:57 ET     T: 03/29/2012 10:46:44 ET       JOB#: [id number] cc: Loreli Dollar, MD, <Dictator> Richard L. Rosanna Randy, MD Loreli Dollar MD ELECTRONICALLY SIGNED 04/01/2012 20:58

## 2015-03-27 NOTE — Discharge Summary (Signed)
PATIENT NAME:  Bianca Malone, DULL MR#:  [id number] DATE OF BIRTH:  [birth year]  DATE OF ADMISSION:  03/29/2012 DATE OF DISCHARGE:  04/01/2012  HISTORY OF PRESENT ILLNESS: This 80 year old female was admitted emergently with chief complaint of a crampy abdominal pain. She had multiple episodes of nausea and vomiting. She was initially evaluated by  Emergency Room staff and had CT scanning which demonstrated a small bowel obstruction, nasogastric tube was inserted and had minimal drainage. She was referred for surgical consultation.   PAST MEDICAL HISTORY:  Hypertension, gastroesophageal reflux and she has had previous bowel obstruction some 10 years ago and laparotomy and lysis of adhesions, has continued to have some intermittent episodes of periumbilical abdominal pain going on for a number of years, occasionally would take phenobarbital for the episodes of pain.  Other details of her past medical history and physical findings are found on the typed history abdomen physical.   She was admitted emergently, placed on IV fluids. She made good progress. By the next day she felt some better passing some gas, having minimal abdominal discomfort, walking in the hall. She had minimal NG drainage, her follow-up x-ray had a nonspecific bowel gas pattern. Her nasogastric tube was clamped and gave additional time for observation which she tolerated clamping of the NG tube satisfactorily, was later placed on a clear liquid diet, and advanced to full liquid diet, tolerated this satisfactorily.   FINAL DIAGNOSIS:  Partial small bowel obstruction.  Discharge instructions were given.        PLAN:  Continue to advance diet as tolerated, follow up in the office, will at some point consider small bowel x-ray series for further evaluation   ____________________________ J. Rochel Brome, MD [email or web address] D: 04/14/2012 08:58:00 ET T: 04/15/2012 12:10:11 ET JOB#: [id number]  cc: Loreli Dollar, MD, <Dictator> Loreli Dollar  MD ELECTRONICALLY SIGNED 04/15/2012 13:02

## 2015-05-28 ENCOUNTER — Other Ambulatory Visit: Payer: Self-pay | Admitting: Family Medicine

## 2015-05-30 ENCOUNTER — Other Ambulatory Visit: Payer: Self-pay | Admitting: Family Medicine

## 2015-05-30 NOTE — Telephone Encounter (Signed)
This is Dr. Gilbert's patient. Please review/ thank you-aa 

## 2015-05-30 NOTE — Telephone Encounter (Signed)
Printed, please fax or call in to pharmacy. Thank you.   

## 2015-06-14 ENCOUNTER — Other Ambulatory Visit: Payer: Self-pay | Admitting: Family Medicine

## 2015-06-17 ENCOUNTER — Telehealth: Payer: Self-pay | Admitting: Family Medicine

## 2015-06-17 NOTE — Telephone Encounter (Signed)
Pt called saying pharmacy told her that the manufacturer for DONNATAL 16.2 MG tablet is no longer making this drug.  She wants to know what you  Are going to prescribe for her for her stomach pains.  Call back at [phone] or [phone].

## 2015-06-17 NOTE — Telephone Encounter (Signed)
Bentyl will be fine. If that does not help with defer and refer back to Dr. Vira Agar.

## 2015-06-17 NOTE — Telephone Encounter (Signed)
Would Bentyl be a substitute? Bianca Malone

## 2015-06-20 MED ORDER — DICYCLOMINE HCL 20 MG PO TABS: 20.0000 mg | ORAL_TABLET | Freq: Four times a day (QID) | ORAL | Status: DC | PRN

## 2015-08-19 DIAGNOSIS — K589 Irritable bowel syndrome without diarrhea: Secondary | ICD-10-CM | POA: Insufficient documentation

## 2015-08-19 DIAGNOSIS — M1990 Unspecified osteoarthritis, unspecified site: Secondary | ICD-10-CM | POA: Insufficient documentation

## 2015-08-19 DIAGNOSIS — K56609 Unspecified intestinal obstruction, unspecified as to partial versus complete obstruction: Secondary | ICD-10-CM | POA: Insufficient documentation

## 2015-08-19 DIAGNOSIS — J309 Allergic rhinitis, unspecified: Secondary | ICD-10-CM | POA: Insufficient documentation

## 2015-08-19 DIAGNOSIS — I059 Rheumatic mitral valve disease, unspecified: Secondary | ICD-10-CM | POA: Insufficient documentation

## 2015-08-19 DIAGNOSIS — N6019 Diffuse cystic mastopathy of unspecified breast: Secondary | ICD-10-CM | POA: Insufficient documentation

## 2015-08-19 DIAGNOSIS — K219 Gastro-esophageal reflux disease without esophagitis: Secondary | ICD-10-CM | POA: Insufficient documentation

## 2015-08-19 DIAGNOSIS — E041 Nontoxic single thyroid nodule: Secondary | ICD-10-CM | POA: Insufficient documentation

## 2015-08-19 DIAGNOSIS — I10 Essential (primary) hypertension: Secondary | ICD-10-CM | POA: Insufficient documentation

## 2015-08-19 DIAGNOSIS — F419 Anxiety disorder, unspecified: Secondary | ICD-10-CM | POA: Insufficient documentation

## 2015-08-19 DIAGNOSIS — N951 Menopausal and female climacteric states: Secondary | ICD-10-CM | POA: Insufficient documentation

## 2015-08-22 ENCOUNTER — Ambulatory Visit (INDEPENDENT_AMBULATORY_CARE_PROVIDER_SITE_OTHER): Payer: Medicare Other | Admitting: Family Medicine

## 2015-08-22 ENCOUNTER — Encounter: Payer: Self-pay | Admitting: Family Medicine

## 2015-08-22 VITALS — BP 152/60 | HR 52 | Temp 97.7°F | Resp 16 | Wt 133.0 lb

## 2015-08-22 DIAGNOSIS — I10 Essential (primary) hypertension: Secondary | ICD-10-CM | POA: Diagnosis not present

## 2015-08-22 DIAGNOSIS — F419 Anxiety disorder, unspecified: Secondary | ICD-10-CM | POA: Diagnosis not present

## 2015-08-22 DIAGNOSIS — K589 Irritable bowel syndrome without diarrhea: Secondary | ICD-10-CM

## 2015-08-22 DIAGNOSIS — K598 Other specified functional intestinal disorders: Secondary | ICD-10-CM | POA: Diagnosis not present

## 2015-08-22 DIAGNOSIS — Z23 Encounter for immunization: Secondary | ICD-10-CM

## 2015-08-22 NOTE — Progress Notes (Signed)
Patient ID: Bianca Malone, female   DOB: Jan 10, 1935, 80 y.o.   MRN: [id number]    Subjective:  HPI  Hypertension, follow-up:  BP Readings from Last 3 Encounters:  08/22/15 152/60  02/15/15 124/58    She was last seen for hypertension 6 months ago.  BP at that visit was 124/58. Management since that visit includes none. She reports good compliance with treatment. She is not having side effects.  She is exercising about 3-4 times a week. She is adherent to low salt diet.   Outside blood pressures are 120-130's/50-60's. She is experiencing none.   Wt Readings from Last 3 Encounters:  08/22/15 133 lb (60.328 kg)  02/15/15 128 lb (58.06 kg)   ------------------------------------------------------------------------     Prior to Admission medications   Medication Sig Start Date End Date Taking? Authorizing Provider  aspirin 81 MG chewable tablet Chew by mouth.   Yes Historical Provider, MD  CALCIUM CARBONATE-VITAMIN D PO Take by mouth.   Yes Historical Provider, MD  dicyclomine (BENTYL) 20 MG tablet Take 1 tablet (20 mg total) by mouth every 6 (six) hours as needed for spasms. 06/20/15  Yes Richard Maceo Pro., MD  losartan (COZAAR) 100 MG tablet TAKE 1 TABLET BY MOUTH DAILY 05/28/15  Yes Jerrol Banana., MD  omeprazole (PRILOSEC) 20 MG capsule Take by mouth. 11/29/14  Yes Historical Provider, MD  POLYETHYLENE GLYCOL 3350 PO Take by mouth.   Yes Historical Provider, MD  promethazine (PHENERGAN) 25 MG tablet TAKE 1 TABLET BY MOUTH EVERY 4 HOURS AS NEEDED 06/14/15  Yes Richard Maceo Pro., MD    Patient Active Problem List   Diagnosis Date Noted  . Allergic rhinitis 08/19/2015  . Anxiety 08/19/2015  . Intestinal obstruction 08/19/2015  . Arthritis 08/19/2015  . Osteoarthrosis 08/19/2015  . Gastro-esophageal reflux disease without esophagitis 08/19/2015  . 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 BP (high blood pressure) 08/19/2015  . Mitral valve  disorder 08/19/2015  . Post menopausal syndrome 08/19/2015  . Thyroid nodule 08/19/2015    History reviewed. No pertinent past medical history.  Social History   Social History  . Marital Status: Married    Spouse Name: N/A  . Number of Children: N/A  . Years of Education: N/A   Occupational History  . Not on file.   Social History Main Topics  . Smoking status: Never Smoker   . Smokeless tobacco: Not on file  . Alcohol Use: Yes     Comment: about 3 times a week.  . Drug Use: No  . Sexual Activity: Yes    Birth Control/ Protection: Condom   Other Topics Concern  . Not on file   Social History Narrative    Allergies  Allergen Reactions  . Loratadine   . Amoxicillin Hives and Rash  . Daypro  [Oxaprozin] Rash  . Sulfa Antibiotics Rash    presuptive reaction    Review of Systems  Constitutional: Negative.   HENT: Negative.   Eyes: Negative.   Respiratory: Negative.   Cardiovascular: Negative.   Gastrointestinal: Negative.   Genitourinary: Negative.   Musculoskeletal: Negative.   Skin: Negative.   Neurological: Negative.   Endo/Heme/Allergies: Negative.   Psychiatric/Behavioral: Negative.     Immunization History  Administered Date(s) Administered  . Pneumococcal Conjugate-13 11/16/2014  . Pneumococcal Polysaccharide-23 03/13/2011  . Td 03/30/2004   Objective:  BP 152/60 mmHg  Pulse 52  Temp(Src) 97.7 F (36.5 C) (Oral)  Resp 16  Wt 133 lb (60.328 kg)  Physical Exam  Constitutional: She is oriented to person, place, and time and well-developed, well-nourished, and in no distress.  HENT:  Head: Normocephalic and atraumatic.  Right Ear: External ear normal.  Left Ear: External ear normal.  Nose: Nose normal.  Eyes: Conjunctivae are normal.  Neck: Neck supple.  Cardiovascular: Normal rate, regular rhythm and normal heart sounds.   Pulmonary/Chest: Effort normal and breath sounds normal.  Abdominal: Soft.  Neurological: She is alert and  oriented to person, place, and time.  Skin: Skin is warm.  Psychiatric: Mood, memory, affect and judgment normal.    Lab Results  Component Value Date   WBC 4.7 02/17/2015   HGB 13.0 02/17/2015   HCT 38 02/17/2015   PLT 198 02/17/2015   GLUCOSE 123* 09/29/2014   CHOL 182 03/12/2011   TRIG 49 03/12/2011   HDL 100* 03/12/2011   LDLCALC 72 03/12/2011   TSH 5.24 02/17/2015    CMP     Component Value Date/Time   NA 138 02/17/2015   NA 139 09/29/2014 0015   K 4.1 02/17/2015   K 3.5 09/29/2014 0015   CL 102 09/29/2014 0015   CO2 26 09/29/2014 0015   GLUCOSE 123* 09/29/2014 0015   BUN 23* 02/17/2015   BUN 22* 09/29/2014 0015   CREATININE 0.8 02/17/2015   CREATININE 1.10 09/29/2014 0015   CALCIUM 8.7 09/29/2014 0015   PROT 6.9 09/29/2014 0015   ALBUMIN 3.5 09/29/2014 0015   AST 17 02/17/2015   AST 24 09/29/2014 0015   ALT 12 02/17/2015   ALT 24 09/29/2014 0015   ALKPHOS 71 02/17/2015   ALKPHOS 72 09/29/2014 0015   BILITOT 1.1* 09/29/2014 0015   GFRNONAA 51* 09/29/2014 0015   GFRNONAA >60 08/02/2013 0935   GFRAA >60 09/29/2014 0015   GFRAA >60 08/02/2013 0935    Assessment and Plan :  1. Essential hypertension Controlled. "White coat" HTN 2. Anxiety   3. Adaptive colitis   4. Need for influenza vaccination  - Flu vaccine HIGH DOSE PF 5.GERD Try Zantac 150mg  BID  D/C  Omeprazole.Pt wishes to get off of PPI.  Miguel Aschoff MD [facility] 08/22/2015 10:16 AM

## 2015-08-24 MED ORDER — RANITIDINE HCL 150 MG PO TABS: 150.0000 mg | ORAL_TABLET | Freq: Two times a day (BID) | ORAL | Status: DC

## 2015-09-26 ENCOUNTER — Telehealth: Payer: Self-pay | Admitting: Family Medicine

## 2015-09-26 MED ORDER — PANTOPRAZOLE SODIUM 40 MG PO TBEC: 40.0000 mg | DELAYED_RELEASE_TABLET | Freq: Every day | ORAL | Status: DC

## 2015-09-26 NOTE — Telephone Encounter (Signed)
Patient states that she stopped Omeprazole in September due to concerns with possible memory issues and this medication, Zantac was started and but did not control her heartburn so she tried her husband's Pantoprazole 40 mg for 2 weeks now and her symptoms have resolved. Can she get a RX for this? Please review. Dr. Marlan Palau patient-aa

## 2015-09-26 NOTE — Telephone Encounter (Signed)
PT would like to speak to a nurse b/c she has stopped taking ranitidine (ZANTAC) 150 MG tablet because she thinks it is what kept her up at night when she first started taking it. Pt stated that she started taking her husband's Pantoprazole 40 mg and it has helped. Pt just wanted to discuss this with a nurse. Thanks TNP

## 2015-09-26 NOTE — Telephone Encounter (Signed)
Ok to send in Pantoprazole 40 mg, 30 days and 5 refills.   Thanks.

## 2015-09-26 NOTE — Telephone Encounter (Signed)
Husband advised-aa 

## 2015-11-07 ENCOUNTER — Other Ambulatory Visit: Payer: Self-pay | Admitting: Unknown Physician Specialty

## 2015-11-07 DIAGNOSIS — Z1231 Encounter for screening mammogram for malignant neoplasm of breast: Secondary | ICD-10-CM

## 2015-11-11 ENCOUNTER — Ambulatory Visit
Admission: RE | Admit: 2015-11-11 | Discharge: 2015-11-11 | Disposition: A | Discharge status: Home / Self Care | Payer: Medicare Other | Source: Ambulatory Visit | Attending: Unknown Physician Specialty | Admitting: Unknown Physician Specialty

## 2015-11-11 ENCOUNTER — Other Ambulatory Visit: Payer: Self-pay | Admitting: Unknown Physician Specialty

## 2015-11-11 DIAGNOSIS — Z1231 Encounter for screening mammogram for malignant neoplasm of breast: Secondary | ICD-10-CM

## 2015-11-23 ENCOUNTER — Ambulatory Visit (INDEPENDENT_AMBULATORY_CARE_PROVIDER_SITE_OTHER): Payer: Medicare Other | Admitting: Family Medicine

## 2015-11-23 VITALS — BP 156/68 | HR 66 | Temp 97.7°F | Resp 16 | Ht 65.0 in | Wt 134.0 lb

## 2015-11-23 DIAGNOSIS — Z0000 Encounter for general adult medical examination without abnormal findings: Secondary | ICD-10-CM

## 2015-11-23 NOTE — Progress Notes (Signed)
Patient ID: Bianca Malone, female   DOB: 10-Nov-1935, 80 y.o.   MRN: [id number]  Visit Date: 11/23/2015  Today's Provider: Wilhemena Durie, MD   Chief Complaint  Patient presents with  . Annual Exam   Subjective:   Bianca Malone is a 80 y.o. female who presents today for her Subsequent Annual Wellness Visit. She feels well. She reports exercising as a form of walking almost daily. She reports she is sleeping well. Pt says her BP is better when at home. Immunization History  Administered Date(s) Administered  . Influenza, High Dose Seasonal PF 08/22/2015  . Pneumococcal Conjugate-13 11/16/2014  . Pneumococcal Polysaccharide-23 03/13/2011  . Td 03/30/2004   LAST: Colonoscopy 04/23/07 hemorrhoids, chronic duodenitis, repeat in May 2018.  EKG 02/09/10  BMD 03/02/15 some osteopenia  Mammogram 11/2015 order per gyn  Pap smear 09/13/15 per patient per gyn  Review of Systems  Constitutional: Negative.   HENT: Positive for hearing loss and tinnitus.   Eyes: Negative.   Respiratory: Negative.   Cardiovascular: Negative.   Gastrointestinal: Positive for constipation.  Endocrine: Negative.   Genitourinary: Positive for urgency.  Musculoskeletal: Positive for neck stiffness.  Skin: Negative.   Allergic/Immunologic: Negative.   Neurological: Negative.   Hematological: Negative.   Psychiatric/Behavioral: Negative.     Patient Active Problem List   Diagnosis Date Noted  . Allergic rhinitis 08/19/2015  . Anxiety 08/19/2015  . Intestinal obstruction ([place]) 08/19/2015  . Arthritis 08/19/2015  . Osteoarthrosis 08/19/2015  . Gastro-esophageal reflux disease without esophagitis 08/19/2015  . 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 BP (high blood pressure) 08/19/2015  . Mitral valve disorder 08/19/2015  . Post menopausal syndrome 08/19/2015  . Thyroid nodule 08/19/2015    Social History   Social History  . Marital Status: Married    Spouse Name: N/A  . Number  of Children: N/A  . Years of Education: N/A   Occupational History  . Not on file.   Social History Main Topics  . Smoking status: Never Smoker   . Smokeless tobacco: Not on file  . Alcohol Use: Yes     Comment: about 3 times a week.  . Drug Use: No  . Sexual Activity: Yes    Birth Control/ Protection: Condom   Other Topics Concern  . Not on file   Social History Narrative    Past Surgical History  Procedure Laterality Date  . Carpal tunnel release    . Cyst excision      thyroglossal duct cyst surgery  . Abdominal adhesion surgery      small bowel lysis of adhesions  . Hysteroscopy    . Breast cyst aspiration Left     Her family history includes Alzheimer's disease in her mother; Breast cancer in her paternal aunt; CAD in her father; Heart disease in her brother and brother; Hypertension in her father and mother; Kidney failure in her mother; Prostate cancer in her paternal uncle.    Outpatient Prescriptions Prior to Visit  Medication Sig Dispense Refill  . aspirin 81 MG chewable tablet Chew by mouth.    Marland Kitchen CALCIUM CARBONATE-VITAMIN D PO Take by mouth.    . dicyclomine (BENTYL) 20 MG tablet Take 1 tablet (20 mg total) by mouth every 6 (six) hours as needed for spasms. 120 tablet 5  . losartan (COZAAR) 100 MG tablet TAKE 1 TABLET BY MOUTH DAILY 30 tablet 11  . pantoprazole (PROTONIX) 40 MG tablet Take 1 tablet (40 mg total)  by mouth daily. 30 tablet 5  . promethazine (PHENERGAN) 25 MG tablet TAKE 1 TABLET BY MOUTH EVERY 4 HOURS AS NEEDED 55 tablet 0  . amLODipine (NORVASC) 5 MG tablet Take by mouth.    Marland Kitchen POLYETHYLENE GLYCOL 3350 PO Take by mouth.     No facility-administered medications prior to visit.    Allergies  Allergen Reactions  . Loratadine   . Amoxicillin Hives and Rash  . Daypro  [Oxaprozin] Rash  . Sulfa Antibiotics Rash    presuptive reaction    Patient Care Team: Jerrol Banana., MD as PCP - General (Family Medicine)  Objective:    Vitals:  Filed Vitals:   11/23/15 0955  BP: 156/68  Pulse: 66  Temp: 97.7 F (36.5 C)  Resp: 16  Height: 5\' 5"  (1.651 m)  Weight: 134 lb (60.782 kg)    Physical Exam  Constitutional: She is oriented to person, place, and time. She appears well-developed and well-nourished.  HENT:  Head: Normocephalic and atraumatic.  Right Ear: External ear normal.  Left Ear: External ear normal.  Nose: Nose normal.  Eyes: Conjunctivae are normal.  Neck: Neck supple.  Cardiovascular: Normal rate, regular rhythm and normal heart sounds.   Pulmonary/Chest: Effort normal and breath sounds normal.  Abdominal: Soft.  Neurological: She is alert and oriented to person, place, and time.  Skin: Skin is warm and dry.  Psychiatric: She has a normal mood and affect. Her behavior is normal. Judgment and thought content normal.    Activities of Daily Living In your present state of health, do you have any difficulty performing the following activities: 08/22/2015  Hearing? N  Vision? N  Difficulty concentrating or making decisions? N  Walking or climbing stairs? N  Dressing or bathing? N  Doing errands, shopping? N    Fall Risk Assessment Fall Risk  08/22/2015  Falls in the past year? No     Depression Screen PHQ 2/9 Scores 08/22/2015  PHQ - 2 Score 0    Cognitive Testing - 6-CIT    Year: 0 4 points  Month: 0 3 points  Memorize "Pia Mau, [address], [place]"  Time (within 1 hour:) 0 3 points  Count backwards from 20: 0 2 4 points  Name months of year: 0 2 4 points  Repeat Address: 0 2 4 6 8 10  points   Total Score: 3/28  Interpretation : Normal (0-7) Abnormal (8-28)    Assessment & Plan:     Annual Wellness Visit  Reviewed patient's Family Medical History Reviewed and updated list of patient's medical providers Assessment of cognitive impairment was done Assessed patient's functional ability Established a written schedule for health screening [place] Completed and Reviewed     Miguel Aschoff MD [facility] 11/23/2015 9:56 AM  ------------------------------------------------------------------------------------------------------------

## 2016-01-02 ENCOUNTER — Encounter: Payer: Self-pay | Admitting: Family Medicine

## 2016-01-02 ENCOUNTER — Ambulatory Visit: Payer: Self-pay | Admitting: Family Medicine

## 2016-01-02 ENCOUNTER — Ambulatory Visit (INDEPENDENT_AMBULATORY_CARE_PROVIDER_SITE_OTHER): Payer: Medicare Other | Admitting: Family Medicine

## 2016-01-02 VITALS — BP 158/60 | HR 64 | Temp 97.5°F | Resp 16 | Wt 136.0 lb

## 2016-01-02 DIAGNOSIS — M719 Bursopathy, unspecified: Secondary | ICD-10-CM | POA: Diagnosis not present

## 2016-01-02 DIAGNOSIS — T148 Other injury of unspecified body region: Secondary | ICD-10-CM

## 2016-01-02 DIAGNOSIS — T148XXA Other injury of unspecified body region, initial encounter: Secondary | ICD-10-CM

## 2016-01-02 MED ORDER — IBUPROFEN 600 MG PO TABS: 600.0000 mg | ORAL_TABLET | Freq: Four times a day (QID) | ORAL | Status: DC | PRN

## 2016-01-02 NOTE — Progress Notes (Signed)
Patient ID: Bianca Malone, female   DOB: Apr 01, 1935, 81 y.o.   MRN: [id number]    Subjective:  HPI Pt reports that she has been having leg pain for a little over 2 weeks. It is located in her left leg.She reports that it starts at her hip area and runs down the front of her leg and sometimes down to her foot. Also when she lefts the opposite leg she feels it pull in the same area that she is here about. She reports that it does not bother her as much when she is walking but when she goes to sit or get up out of a chair.   Prior to Admission medications   Medication Sig Start Date End Date Taking? Authorizing Provider  aspirin 81 MG chewable tablet Chew by mouth.   Yes Historical Provider, MD  CALCIUM CARBONATE-VITAMIN D PO Take by mouth.   Yes Historical Provider, MD  dicyclomine (BENTYL) 20 MG tablet Take 1 tablet (20 mg total) by mouth every 6 (six) hours as needed for spasms. 06/20/15  Yes Richard Maceo Pro., MD  losartan (COZAAR) 100 MG tablet TAKE 1 TABLET BY MOUTH DAILY 05/28/15  Yes Jerrol Banana., MD  pantoprazole (PROTONIX) 40 MG tablet Take 1 tablet (40 mg total) by mouth daily. 09/26/15  Yes Richard Maceo Pro., MD  promethazine (PHENERGAN) 25 MG tablet TAKE 1 TABLET BY MOUTH EVERY 4 HOURS AS NEEDED 06/14/15  Yes Jerrol Banana., MD    Patient Active Problem List   Diagnosis Date Noted  . Allergic rhinitis 08/19/2015  . Anxiety 08/19/2015  . Intestinal obstruction ([place]) 08/19/2015  . Arthritis 08/19/2015  . Osteoarthrosis 08/19/2015  . Gastro-esophageal reflux disease without esophagitis 08/19/2015  . 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 BP (high blood pressure) 08/19/2015  . Mitral valve disorder 08/19/2015  . Post menopausal syndrome 08/19/2015  . Thyroid nodule 08/19/2015    History reviewed. No pertinent past medical history.  Social History   Social History  . Marital Status: Married    Spouse Name: N/A  . Number of  Children: N/A  . Years of Education: N/A   Occupational History  . Not on file.   Social History Main Topics  . Smoking status: Never Smoker   . Smokeless tobacco: Not on file  . Alcohol Use: Yes     Comment: about 3 times a week.  . Drug Use: No  . Sexual Activity: Yes    Birth Control/ Protection: Condom   Other Topics Concern  . Not on file   Social History Narrative    Allergies  Allergen Reactions  . Loratadine   . Amoxicillin Hives and Rash  . Daypro  [Oxaprozin] Rash  . Sulfa Antibiotics Rash    presuptive reaction    Review of Systems  Constitutional: Negative.   HENT: Negative.   Eyes: Negative.   Respiratory: Negative.   Cardiovascular: Negative.   Gastrointestinal: Negative.   Genitourinary: Negative.   Musculoskeletal: Positive for myalgias and joint pain.  Skin: Negative.   Neurological: Negative.   Endo/Heme/Allergies: Negative.   Psychiatric/Behavioral: Negative.     Immunization History  Administered Date(s) Administered  . Influenza, High Dose Seasonal PF 08/22/2015  . Pneumococcal Conjugate-13 11/16/2014  . Pneumococcal Polysaccharide-23 03/13/2011  . Td 03/30/2004   Objective:  BP 158/60 mmHg  Pulse 64  Temp(Src) 97.5 F (36.4 C) (Oral)  Resp 16  Wt 136 lb (61.689 kg)  Physical  Exam  Constitutional: She is oriented to person, place, and time and well-developed, well-nourished, and in no distress.  Eyes: Conjunctivae and EOM are normal. Pupils are equal, round, and reactive to light.  Neck: Normal range of motion. Neck supple.  Cardiovascular: Normal rate, regular rhythm, normal heart sounds and intact distal pulses.   Pulmonary/Chest: Effort normal and breath sounds normal.  Musculoskeletal: Normal range of motion.  Neurological: She is alert and oriented to person, place, and time. She has normal reflexes. Gait normal. GCS score is 15.  Skin: Skin is warm and dry.  Psychiatric: Mood, memory, affect and judgment normal.    Lab  Results  Component Value Date   WBC 4.7 02/17/2015   HGB 13.0 02/17/2015   HCT 38 02/17/2015   PLT 198 02/17/2015   GLUCOSE 123* 09/29/2014   CHOL 182 03/12/2011   TRIG 49 03/12/2011   HDL 100* 03/12/2011   LDLCALC 72 03/12/2011   TSH 5.24 02/17/2015    CMP     Component Value Date/Time   NA 138 02/17/2015   NA 139 09/29/2014 0015   K 4.1 02/17/2015   K 3.5 09/29/2014 0015   CL 102 09/29/2014 0015   CO2 26 09/29/2014 0015   GLUCOSE 123* 09/29/2014 0015   BUN 23* 02/17/2015   BUN 22* 09/29/2014 0015   CREATININE 0.8 02/17/2015   CREATININE 1.10 09/29/2014 0015   CALCIUM 8.7 09/29/2014 0015   PROT 6.9 09/29/2014 0015   ALBUMIN 3.5 09/29/2014 0015   AST 17 02/17/2015   AST 24 09/29/2014 0015   ALT 12 02/17/2015   ALT 24 09/29/2014 0015   ALKPHOS 71 02/17/2015   ALKPHOS 72 09/29/2014 0015   BILITOT 1.1* 09/29/2014 0015   GFRNONAA 51* 09/29/2014 0015   GFRNONAA >60 08/02/2013 0935   GFRAA >60 09/29/2014 0015   GFRAA >60 08/02/2013 0935    Assessment and Plan :  1. Bursitis If not improved, pt will call back for ortho referral. - ibuprofen (ADVIL,MOTRIN) 600 MG tablet; Take 1 tablet (600 mg total) by mouth every 6 (six) hours as needed.  Dispense: 90 tablet; Refill: 2  2. Muscle strain Should Benefit from rest and heat.   Patient was seen and examined by Dr. Miguel Aschoff, and noted scribed by Webb Laws, [place] MD [facility] 01/02/2016 2:29 PM

## 2016-01-04 ENCOUNTER — Telehealth: Payer: Self-pay

## 2016-01-04 ENCOUNTER — Ambulatory Visit: Payer: Medicare Other | Admitting: Family Medicine

## 2016-01-04 MED ORDER — MELOXICAM 7.5 MG PO TABS: 7.5000 mg | ORAL_TABLET | Freq: Every day | ORAL | Status: DC

## 2016-01-04 NOTE — Telephone Encounter (Signed)
Patient reports that the pharmacy told her that she can not take Ibuprofen 600mg  because they have on file that she is allergic to it. She reports that she didn't remember being allergic, and she has taken OTC Advil with no issues. She reports that it sometimes makes her stomach upset, but that's it. She reports that she is still having pain, but would like something different called into the pharmacy. I told the patient that per our records, we do not have a Ibuprofen allergy listed. She is still requesting that medication be changed. Patient uses [facility] on [address] Thanks!

## 2016-01-04 NOTE — Telephone Encounter (Signed)
Pt informed. Med sent to pharmacy.  

## 2016-01-04 NOTE — Telephone Encounter (Signed)
Meloxicam 7.5 mg daily prn,#30,1rf

## 2016-01-23 ENCOUNTER — Telehealth: Payer: Self-pay | Admitting: Family Medicine

## 2016-01-23 ENCOUNTER — Other Ambulatory Visit: Payer: Self-pay

## 2016-01-23 DIAGNOSIS — M719 Bursopathy, unspecified: Secondary | ICD-10-CM

## 2016-01-23 DIAGNOSIS — M25559 Pain in unspecified hip: Secondary | ICD-10-CM

## 2016-01-23 NOTE — Telephone Encounter (Signed)
Please refer to ortho. Patient is requesting that she knows which Dr you will schedule her with before you set the appt up. Thanks!

## 2016-01-23 NOTE — Telephone Encounter (Signed)
Bianca Malone, The patient requests that you call and let her know who you are sending her to before you make the appointment. [employer]

## 2016-01-23 NOTE — Telephone Encounter (Signed)
Please advise 

## 2016-01-23 NOTE — Telephone Encounter (Signed)
Refer to Ortho?

## 2016-01-23 NOTE — Telephone Encounter (Signed)
Pain in left hip and leg.  She took the meloxicam until the 14th and advil since.  She said the pain is still really bad.   She thinks she needs an xray or mri.    Call back is [phone]  Thanks Con Memos

## 2016-01-25 ENCOUNTER — Telehealth: Payer: Self-pay | Admitting: Family Medicine

## 2016-01-25 NOTE — Telephone Encounter (Signed)
Please review-aa 

## 2016-01-25 NOTE — Telephone Encounter (Signed)
Until orhto appt,

## 2016-01-25 NOTE — Telephone Encounter (Signed)
Pt would like to know if she can resume taking meloxicam (MOBIC) 7.5 MG tablet again. Pt stated that she stopped taking the medication on 01/17/16 as directed but she would like to take something for the pain b/c she doesn't see ortho until 02/01/16. Please advise. Thanks TNP

## 2016-01-25 NOTE — Telephone Encounter (Signed)
Patient advised as directed below. 

## 2016-02-01 ENCOUNTER — Ambulatory Visit
Admission: RE | Admit: 2016-02-01 | Discharge: 2016-02-01 | Disposition: A | Discharge status: Home / Self Care | Payer: Medicare Other | Source: Ambulatory Visit | Attending: Orthopedic Surgery | Admitting: Orthopedic Surgery

## 2016-02-01 ENCOUNTER — Other Ambulatory Visit: Payer: Self-pay | Admitting: Orthopedic Surgery

## 2016-02-01 DIAGNOSIS — M25552 Pain in left hip: Secondary | ICD-10-CM | POA: Diagnosis present

## 2016-02-01 DIAGNOSIS — M4806 Spinal stenosis, lumbar region: Secondary | ICD-10-CM | POA: Insufficient documentation

## 2016-02-01 DIAGNOSIS — M545 Low back pain: Secondary | ICD-10-CM

## 2016-02-01 DIAGNOSIS — M1612 Unilateral primary osteoarthritis, left hip: Secondary | ICD-10-CM | POA: Diagnosis not present

## 2016-02-02 ENCOUNTER — Telehealth: Payer: Self-pay | Admitting: Family Medicine

## 2016-02-02 NOTE — Telephone Encounter (Signed)
Pt's husband would like a nurse to return his call about the ortho office that we referred pt to, put pt on Prednisone 5 mg. Pt is directed to take 6 a day and they are wanting to know if it is safe to take all 6 at once. Husband stated they tried to contact the ortho's office but was advised no nurses were on duty today. Thanks TNP

## 2016-02-02 NOTE — Telephone Encounter (Signed)
Patient advised  ED 

## 2016-03-08 ENCOUNTER — Ambulatory Visit (INDEPENDENT_AMBULATORY_CARE_PROVIDER_SITE_OTHER): Payer: Medicare Other | Admitting: Physician Assistant

## 2016-03-08 ENCOUNTER — Encounter: Payer: Self-pay | Admitting: Physician Assistant

## 2016-03-08 VITALS — BP 150/60 | HR 86 | Temp 98.2°F | Resp 16 | Wt 134.2 lb

## 2016-03-08 DIAGNOSIS — R05 Cough: Secondary | ICD-10-CM | POA: Diagnosis not present

## 2016-03-08 DIAGNOSIS — J069 Acute upper respiratory infection, unspecified: Secondary | ICD-10-CM

## 2016-03-08 DIAGNOSIS — R059 Cough, unspecified: Secondary | ICD-10-CM

## 2016-03-08 MED ORDER — AZITHROMYCIN 250 MG PO TABS: ORAL_TABLET | ORAL | Status: DC

## 2016-03-08 MED ORDER — HYDROCODONE-HOMATROPINE 5-1.5 MG/5ML PO SYRP: 5.0000 mL | ORAL_SOLUTION | Freq: Three times a day (TID) | ORAL | Status: DC | PRN

## 2016-03-08 NOTE — Progress Notes (Signed)
Patient: Bianca Malone Female    DOB: Apr 02, 1935   81 y.o.   MRN: [id number] Visit Date: 03/08/2016  Today's Provider: Mar Daring, PA-C   Chief Complaint  Patient presents with  . URI   Subjective:    URI  This is a new problem. The current episode started 1 to 4 weeks ago (For a week in 1/2). The problem has been gradually worsening. There has been no fever. Associated symptoms include congestion, coughing (coughing up yellow phlegm), headaches, rhinorrhea, sinus pain and a sore throat (from the drainage). Pertinent negatives include no abdominal pain, chest pain, ear pain, nausea, plugged ear sensation, sneezing, vomiting or wheezing. She has tried decongestant and increased fluids (cold symptoms tablet.) for the symptoms. The treatment provided no relief.       Allergies  Allergen Reactions  . Loratadine   . Amoxicillin Hives and Rash  . Daypro  [Oxaprozin] Rash  . Sulfa Antibiotics Rash    presuptive reaction   Previous Medications   ASPIRIN 81 MG CHEWABLE TABLET    Chew by mouth.   CALCIUM CARBONATE-VITAMIN D PO    Take by mouth. Reported on 03/08/2016   DICYCLOMINE (BENTYL) 20 MG TABLET    Take 1 tablet (20 mg total) by mouth every 6 (six) hours as needed for spasms.   IBUPROFEN (ADVIL,MOTRIN) 600 MG TABLET    Take 1 tablet (600 mg total) by mouth every 6 (six) hours as needed.   LOSARTAN (COZAAR) 100 MG TABLET    TAKE 1 TABLET BY MOUTH DAILY   MELOXICAM (MOBIC) 7.5 MG TABLET    Take 1 tablet (7.5 mg total) by mouth daily.   PANTOPRAZOLE (PROTONIX) 40 MG TABLET    Take 1 tablet (40 mg total) by mouth daily.   PROMETHAZINE (PHENERGAN) 25 MG TABLET    TAKE 1 TABLET BY MOUTH EVERY 4 HOURS AS NEEDED    Review of Systems  Constitutional: Negative for fever and chills.  HENT: Positive for congestion, postnasal drip, rhinorrhea, sinus pressure, sore throat (from the drainage) and voice change. Negative for ear pain and sneezing.   Respiratory: Positive for cough  (coughing up yellow phlegm). Negative for chest tightness, shortness of breath and wheezing.   Cardiovascular: Negative for chest pain, palpitations and leg swelling.  Gastrointestinal: Negative for nausea, vomiting and abdominal pain.  Neurological: Positive for headaches. Negative for dizziness.    Social History  Substance Use Topics  . Smoking status: Never Smoker   . Smokeless tobacco: Not on file  . Alcohol Use: Yes     Comment: about 3 times a week.   Objective:   BP 150/60 mmHg  Pulse 86  Temp(Src) 98.2 F (36.8 C) (Oral)  Resp 16  Wt 134 lb 3.2 oz (60.873 kg)  SpO2 99%  Physical Exam  Constitutional: She appears well-developed and well-nourished. No distress.  HENT:  Head: Normocephalic and atraumatic.  Right Ear: Hearing, tympanic membrane, external ear and ear canal normal.  Left Ear: Hearing, tympanic membrane, external ear and ear canal normal.  Nose: Mucosal edema and rhinorrhea present. Right sinus exhibits no maxillary sinus tenderness and no frontal sinus tenderness. Left sinus exhibits no maxillary sinus tenderness and no frontal sinus tenderness.  Mouth/Throat: Uvula is midline, oropharynx is clear and moist and mucous membranes are normal. No oropharyngeal exudate, posterior oropharyngeal edema or posterior oropharyngeal erythema.  Eyes: Conjunctivae are normal. Pupils are equal, round, and reactive to light. Right eye exhibits  no discharge. Left eye exhibits no discharge. No scleral icterus.  Neck: Normal range of motion. Neck supple. No tracheal deviation present. No thyromegaly present.  Cardiovascular: Normal rate, regular rhythm and normal heart sounds.  Exam reveals no gallop and no friction rub.   No murmur heard. Pulmonary/Chest: Effort normal and breath sounds normal. No stridor. No respiratory distress. She has no wheezes. She has no rales.  Lymphadenopathy:    She has cervical adenopathy (more submandibular).  Skin: Skin is warm and dry. She is not  diaphoretic.  Vitals reviewed.       Assessment & Plan:     1. Upper respiratory infection Worsening symptoms that have not responded to OTC medications. Will give z-pak as below. She may continue Mucinex DM for congestion. She may take IBU for fevers and body aches. She needs to stay well hydrated and get plenty of rest. She is also having increased nighttime cough that is affecting her sleep. Will give hycodan cough syrup as below. She is to call if symptoms fail to improve or worsen. - azithromycin (ZITHROMAX) 250 MG tablet; Take 2 tablets PO on day one, and one tablet PO daily thereafter until completed.  Dispense: 6 tablet; Refill: 0  2. Cough See above medical treatment plan. - HYDROcodone-homatropine (HYCODAN) 5-1.5 MG/5ML syrup; Take 5 mLs by mouth every 8 (eight) hours as needed.  Dispense: 120 mL; Refill: 0       Mar Daring, PA-C  [facility]

## 2016-03-08 NOTE — Patient Instructions (Signed)
Upper Respiratory Infection, Adult Most upper respiratory infections (URIs) are a viral infection of the air passages leading to the lungs. A URI affects the nose, throat, and upper air passages. The most common type of URI is nasopharyngitis and is typically referred to as "the common cold." URIs run their course and usually go away on their own. Most of the time, a URI does not require medical attention, but sometimes a bacterial infection in the upper airways can follow a viral infection. This is called a secondary infection. Sinus and middle ear infections are common types of secondary upper respiratory infections. Bacterial pneumonia can also complicate a URI. A URI can worsen asthma and chronic obstructive pulmonary disease (COPD). Sometimes, these complications can require emergency medical care and may be life threatening.  CAUSES Almost all URIs are caused by viruses. A virus is a type of germ and can spread from one person to another.  RISKS FACTORS You may be at risk for a URI if:   You smoke.   You have chronic heart or lung disease.  You have a weakened defense (immune) system.   You are very young or very old.   You have nasal allergies or asthma.  You work in crowded or poorly ventilated areas.  You work in health care facilities or schools. SIGNS AND SYMPTOMS  Symptoms typically develop 2-3 days after you come in contact with a cold virus. Most viral URIs last 7-10 days. However, viral URIs from the influenza virus (flu virus) can last 14-18 days and are typically more severe. Symptoms may include:   Runny or stuffy (congested) nose.   Sneezing.   Cough.   Sore throat.   Headache.   Fatigue.   Fever.   Loss of appetite.   Pain in your forehead, behind your eyes, and over your cheekbones (sinus pain).  Muscle aches.  DIAGNOSIS  Your health care provider may diagnose a URI by:  Physical exam.  Tests to check that your symptoms are not due to  another condition such as:  Strep throat.  Sinusitis.  Pneumonia.  Asthma. TREATMENT  A URI goes away on its own with time. It cannot be cured with medicines, but medicines may be prescribed or recommended to relieve symptoms. Medicines may help:  Reduce your fever.  Reduce your cough.  Relieve nasal congestion. HOME CARE INSTRUCTIONS   Take medicines only as directed by your health care provider.   Gargle warm saltwater or take cough drops to comfort your throat as directed by your health care provider.  Use a warm mist humidifier or inhale steam from a shower to increase air moisture. This may make it easier to breathe.  Drink enough fluid to keep your urine clear or pale yellow.   Eat soups and other clear broths and maintain good nutrition.   Rest as needed.   Return to work when your temperature has returned to normal or as your health care provider advises. You may need to stay home longer to avoid infecting others. You can also use a face mask and careful hand washing to prevent spread of the virus.  Increase the usage of your inhaler if you have asthma.   Do not use any tobacco products, including cigarettes, chewing tobacco, or electronic cigarettes. If you need help quitting, ask your health care provider. PREVENTION  The best way to protect yourself from getting a cold is to practice good hygiene.   Avoid oral or hand contact with people with cold   symptoms.   Wash your hands often if contact occurs.  There is no clear evidence that vitamin C, vitamin E, echinacea, or exercise reduces the chance of developing a cold. However, it is always recommended to get plenty of rest, exercise, and practice good nutrition.  SEEK MEDICAL CARE IF:   You are getting worse rather than better.   Your symptoms are not controlled by medicine.   You have chills.  You have worsening shortness of breath.  You have brown or red mucus.  You have yellow or brown nasal  discharge.  You have pain in your face, especially when you bend forward.  You have a fever.  You have swollen neck glands.  You have pain while swallowing.  You have white areas in the back of your throat. SEEK IMMEDIATE MEDICAL CARE IF:   You have severe or persistent:  Headache.  Ear pain.  Sinus pain.  Chest pain.  You have chronic lung disease and any of the following:  Wheezing.  Prolonged cough.  Coughing up blood.  A change in your usual mucus.  You have a stiff neck.  You have changes in your:  Vision.  Hearing.  Thinking.  Mood. MAKE SURE YOU:   Understand these instructions.  Will watch your condition.  Will get help right away if you are not doing well or get worse.   This information is not intended to replace advice given to you by your health care provider. Make sure you discuss any questions you have with your health care provider.   Document Released: 05/15/2001 Document Revised: 04/05/2015 Document Reviewed: 02/24/2014 Elsevier Interactive Patient Education 2016 Elsevier Inc.  

## 2016-03-23 ENCOUNTER — Other Ambulatory Visit: Payer: Self-pay | Admitting: Family Medicine

## 2016-05-21 ENCOUNTER — Ambulatory Visit (INDEPENDENT_AMBULATORY_CARE_PROVIDER_SITE_OTHER): Payer: Medicare Other | Admitting: Family Medicine

## 2016-05-21 ENCOUNTER — Encounter: Payer: Self-pay | Admitting: Family Medicine

## 2016-05-21 VITALS — BP 126/52 | HR 82 | Resp 16 | Wt 130.0 lb

## 2016-05-21 DIAGNOSIS — I10 Essential (primary) hypertension: Secondary | ICD-10-CM

## 2016-05-21 DIAGNOSIS — N8110 Cystocele, unspecified: Secondary | ICD-10-CM

## 2016-05-21 DIAGNOSIS — R5383 Other fatigue: Secondary | ICD-10-CM

## 2016-05-21 DIAGNOSIS — Z1211 Encounter for screening for malignant neoplasm of colon: Secondary | ICD-10-CM

## 2016-05-21 DIAGNOSIS — K219 Gastro-esophageal reflux disease without esophagitis: Secondary | ICD-10-CM | POA: Diagnosis not present

## 2016-05-21 DIAGNOSIS — R739 Hyperglycemia, unspecified: Secondary | ICD-10-CM | POA: Diagnosis not present

## 2016-05-21 NOTE — Progress Notes (Signed)
Patient ID: Bianca Malone, female   DOB: 1935-08-15, 81 y.o.   MRN: [id number]    Subjective:  HPI  Patient is here for 6 months follow up.  Hypertension: patient checks her b/p and readings have been around 134/50-60s or lower. No cardiac symptoms BP Readings from Last 3 Encounters:  05/21/16 126/52  03/08/16 150/60  01/02/16 158/60   GERD: symptoms controlled since switching to Pantoprazole.  Last labs 02/17/15 Patient concerned as one younger brother now has myelodysplastic syndrome and is looking at a bone marrow transplant and  1 younger brother has a GI bleed, presumably secondary to hemorrhoids. Patient is very agitated today. This is not unusual. Prior to Admission medications   Medication Sig Start Date End Date Taking? Authorizing Provider  aspirin 81 MG chewable tablet Chew by mouth.    Historical Provider, MD  azithromycin (ZITHROMAX) 250 MG tablet Take 2 tablets PO on day one, and one tablet PO daily thereafter until completed. 03/08/16   Mar Daring, PA-C  CALCIUM CARBONATE-VITAMIN D PO Take by mouth. Reported on 03/08/2016    Historical Provider, MD  HYDROcodone-homatropine (HYCODAN) 5-1.5 MG/5ML syrup Take 5 mLs by mouth every 8 (eight) hours as needed. 03/08/16   Mar Daring, PA-C  ibuprofen (ADVIL,MOTRIN) 600 MG tablet Take 1 tablet (600 mg total) by mouth every 6 (six) hours as needed. 01/02/16   Jerrol Banana., MD  losartan (COZAAR) 100 MG tablet TAKE 1 TABLET BY MOUTH DAILY 05/28/15   Jerrol Banana., MD  pantoprazole (PROTONIX) 40 MG tablet TAKE 1 TABLET (40 MG TOTAL) BY MOUTH DAILY. 03/23/16   Richard Maceo Pro., MD    Patient Active Problem List   Diagnosis Date Noted  . Allergic rhinitis 08/19/2015  . Anxiety 08/19/2015  . Intestinal obstruction ([place]) 08/19/2015  . Arthritis 08/19/2015  . Osteoarthrosis 08/19/2015  . Gastro-esophageal reflux disease without esophagitis 08/19/2015  . 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 Adaptive colitis  08/19/2015  . BP (high blood pressure) 08/19/2015  . Mitral valve disorder 08/19/2015  . Post menopausal syndrome 08/19/2015  . Thyroid nodule 08/19/2015    No past medical history on file.  Social History   Social History  . Marital Status: Married    Spouse Name: N/A  . Number of Children: N/A  . Years of Education: N/A   Occupational History  . Not on file.   Social History Main Topics  . Smoking status: Never Smoker   . Smokeless tobacco: Never Used  . Alcohol Use: Yes     Comment: about 3 times a week.  . Drug Use: No  . Sexual Activity: Yes    Birth Control/ Protection: Condom   Other Topics Concern  . Not on file   Social History Narrative    Allergies  Allergen Reactions  . Loratadine   . Amoxicillin Hives and Rash  . Daypro  [Oxaprozin] Rash  . Sulfa Antibiotics Rash    presuptive reaction    Review of Systems  Constitutional: Positive for malaise/fatigue.  Eyes: Negative.   Respiratory: Negative.   Cardiovascular: Negative.   Gastrointestinal: Negative.   Genitourinary: Positive for urgency (has prolapse bladder).  Musculoskeletal: Positive for back pain and joint pain.  Neurological: Negative.   Endo/Heme/Allergies: Negative.   Psychiatric/Behavioral: The patient is nervous/anxious.     Immunization History  Administered Date(s) Administered  . Influenza, High Dose Seasonal PF 08/22/2015  . Pneumococcal Conjugate-13 11/16/2014  . Pneumococcal Polysaccharide-23 03/13/2011  . Td  03/30/2004   Objective:  BP 126/52 mmHg  Pulse 82  Resp 16  Wt 130 lb (58.968 kg)  Physical Exam  Constitutional: She is oriented to person, place, and time and well-developed, well-nourished, and in no distress.  HENT:  Head: Normocephalic and atraumatic.  Right Ear: External ear normal.  Left Ear: External ear normal.  Nose: Nose normal.  Cardiovascular: Normal rate, regular rhythm, normal heart sounds and intact distal pulses.   No murmur  heard. Pulmonary/Chest: Effort normal and breath sounds normal. No respiratory distress. She has no wheezes.  Abdominal: There is tenderness (slight tenderness from spasm she had 3 days ago).  Musculoskeletal: She exhibits no edema or tenderness.  Neurological: She is alert and oriented to person, place, and time. Gait normal.  Skin: Skin is warm and dry.  Psychiatric: Mood, memory, affect and judgment normal.    Lab Results  Component Value Date   WBC 4.7 02/17/2015   HGB 13.0 02/17/2015   HCT 38 02/17/2015   PLT 198 02/17/2015   GLUCOSE 123* 09/29/2014   CHOL 182 03/12/2011   TRIG 49 03/12/2011   HDL 100* 03/12/2011   LDLCALC 72 03/12/2011   TSH 5.24 02/17/2015    CMP     Component Value Date/Time   NA 138 02/17/2015   NA 139 09/29/2014 0015   K 4.1 02/17/2015   K 3.5 09/29/2014 0015   CL 102 09/29/2014 0015   CO2 26 09/29/2014 0015   GLUCOSE 123* 09/29/2014 0015   BUN 23* 02/17/2015   BUN 22* 09/29/2014 0015   CREATININE 0.8 02/17/2015   CREATININE 1.10 09/29/2014 0015   CALCIUM 8.7 09/29/2014 0015   PROT 6.9 09/29/2014 0015   ALBUMIN 3.5 09/29/2014 0015   AST 17 02/17/2015   AST 24 09/29/2014 0015   ALT 12 02/17/2015   ALT 24 09/29/2014 0015   ALKPHOS 71 02/17/2015   ALKPHOS 72 09/29/2014 0015   BILITOT 1.1* 09/29/2014 0015   GFRNONAA 51* 09/29/2014 0015   GFRNONAA >60 08/02/2013 0935   GFRAA >60 09/29/2014 0015   GFRAA >60 08/02/2013 0935    Assessment and Plan :  1. Essential hypertension Stable. Continue current medication. Check labs. 2. Gastro-esophageal reflux disease without esophagitis Stable on medication. Refer back to GI. Dr. Vira Agar May consider doing colonoscopy a year early with possible EGD. Patient is very agitated and anxious about the possibility of any GI problem or any health issue whatsoever. 3. Female bladder prolapse Following Dr. Jacqlyn Larsen  4. Hyperglycemia History of elevated sugar, will check labs.  5. Other fatigue Check  labs. Patient is worried for her brothers-1 has myelodysplastic syndrome and 1 brother with anemia and possible GI bleed. Will go ahead and get colonoscopy ordered. I have done the exam and reviewed the above chart and it is accurate to the best of my knowledge.  Patient was seen and examined by Dr. Eulas Post and note was scribed by Theressa Millard, RMA.    Miguel Aschoff MD [facility] 05/21/2016 10:13 AM

## 2016-05-22 LAB — COMPREHENSIVE METABOLIC PANEL
A/G RATIO: 2.1 (ref 1.2–2.2)
ALBUMIN: 4.8 g/dL (ref 3.5–4.8)
ALT: 13 IU/L (ref 0–32)
AST: 21 IU/L (ref 0–40)
Alkaline Phosphatase: 67 IU/L (ref 39–117)
BILIRUBIN TOTAL: 1.3 mg/dL — AB (ref 0.0–1.2)
BUN / CREAT RATIO: 24 (ref 12–28)
BUN: 20 mg/dL (ref 8–27)
CALCIUM: 10.1 mg/dL (ref 8.7–10.3)
CHLORIDE: 97 mmol/L (ref 96–106)
CO2: 24 mmol/L (ref 18–29)
Creatinine, Ser: 0.83 mg/dL (ref 0.57–1.00)
GFR, EST AFRICAN AMERICAN: 78 mL/min/{1.73_m2} (ref >59)
GFR, EST NON AFRICAN AMERICAN: 67 mL/min/{1.73_m2} (ref >59)
Globulin, Total: 2.3 g/dL (ref 1.5–4.5)
Glucose: 85 mg/dL (ref 65–99)
POTASSIUM: 4.3 mmol/L (ref 3.5–5.2)
Sodium: 138 mmol/L (ref 134–144)
TOTAL PROTEIN: 7.1 g/dL (ref 6.0–8.5)

## 2016-05-22 LAB — CBC WITH DIFFERENTIAL/PLATELET
BASOS: 1 %
Basophils Absolute: 0 10*3/uL (ref 0.0–0.2)
EOS (ABSOLUTE): 0.2 10*3/uL (ref 0.0–0.4)
Eos: 3 %
Hematocrit: 37.2 % (ref 34.0–46.6)
Hemoglobin: 12.9 g/dL (ref 11.1–15.9)
Immature Grans (Abs): 0 10*3/uL (ref 0.0–0.1)
Immature Granulocytes: 0 %
LYMPHS ABS: 1.3 10*3/uL (ref 0.7–3.1)
Lymphs: 19 %
MCH: 30.5 pg (ref 26.6–33.0)
MCHC: 34.7 g/dL (ref 31.5–35.7)
MCV: 88 fL (ref 79–97)
MONOS ABS: 0.6 10*3/uL (ref 0.1–0.9)
Monocytes: 8 %
NEUTROS ABS: 4.8 10*3/uL (ref 1.4–7.0)
Neutrophils: 69 %
PLATELETS: 213 10*3/uL (ref 150–379)
RBC: 4.23 x10E6/uL (ref 3.77–5.28)
RDW: 13 % (ref 12.3–15.4)
WBC: 6.8 10*3/uL (ref 3.4–10.8)

## 2016-05-22 LAB — TSH: TSH: 2.25 u[IU]/mL (ref 0.450–4.500)

## 2016-05-22 LAB — HEMOGLOBIN A1C
Est. average glucose Bld gHb Est-mCnc: 100 mg/dL
Hgb A1c MFr Bld: 5.1 % (ref 4.8–5.6)

## 2016-05-26 ENCOUNTER — Other Ambulatory Visit: Payer: Self-pay | Admitting: Family Medicine

## 2016-09-29 ENCOUNTER — Ambulatory Visit (INDEPENDENT_AMBULATORY_CARE_PROVIDER_SITE_OTHER): Payer: Medicare Other

## 2016-09-29 DIAGNOSIS — Z23 Encounter for immunization: Secondary | ICD-10-CM

## 2016-10-18 ENCOUNTER — Other Ambulatory Visit: Payer: Self-pay | Admitting: Obstetrics and Gynecology

## 2016-10-18 DIAGNOSIS — Z1231 Encounter for screening mammogram for malignant neoplasm of breast: Secondary | ICD-10-CM

## 2016-11-12 ENCOUNTER — Ambulatory Visit (INDEPENDENT_AMBULATORY_CARE_PROVIDER_SITE_OTHER): Payer: Medicare Other | Admitting: Family Medicine

## 2016-11-12 VITALS — BP 154/62 | HR 80 | Resp 16 | Wt 134.0 lb

## 2016-11-12 DIAGNOSIS — I10 Essential (primary) hypertension: Secondary | ICD-10-CM | POA: Diagnosis not present

## 2016-11-12 DIAGNOSIS — K219 Gastro-esophageal reflux disease without esophagitis: Secondary | ICD-10-CM

## 2016-11-12 DIAGNOSIS — N8110 Cystocele, unspecified: Secondary | ICD-10-CM | POA: Diagnosis not present

## 2016-11-12 NOTE — Progress Notes (Signed)
Bianca Malone  MRN: [id number] DOB: May 02, 1935  Subjective:  HPI  Patient is here for 6  Months. Patient has not been checking her b/p. No cardiac symptoms. Some shortness of breath at times like going up the steps at the top of the steps-not severe and does not gasp for air. She has a lot she needs to get done for christmas and on her mind, feels a little anxious with all of these things but usually she does not have anxiety issues, BP Readings from Last 3 Encounters:  11/12/16 (!) 174/76  05/21/16 (!) 126/52  03/08/16 (!) 150/60   She takes Protonix every day. Very rarely will have heartburn with taking this medication daily if she eats something very spicy and will then take tums to help the symptom. Patient Active Problem List   Diagnosis Date Noted  . Allergic rhinitis 08/19/2015  . Anxiety 08/19/2015  . Intestinal obstruction 08/19/2015  . Arthritis 08/19/2015  . Osteoarthrosis 08/19/2015  . Gastro-esophageal reflux disease without esophagitis 08/19/2015  . 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 BP (high blood pressure) 08/19/2015  . Mitral valve disorder 08/19/2015  . Post menopausal syndrome 08/19/2015  . Thyroid nodule 08/19/2015    No past medical history on file.  Social History   Social History  . Marital status: Married    Spouse name: N/A  . Number of children: N/A  . Years of education: N/A   Occupational History  . Not on file.   Social History Main Topics  . Smoking status: Never Smoker  . Smokeless tobacco: Never Used  . Alcohol use Yes     Comment: about 3 times a week.  . Drug use: No  . Sexual activity: Yes    Birth control/ protection: Condom   Other Topics Concern  . Not on file   Social History Narrative  . No narrative on file    Outpatient Encounter Prescriptions as of 11/12/2016  Medication Sig Note  . aspirin 81 MG chewable tablet Chew by mouth. 08/19/2015: Received from: [facility]  .  CALCIUM CARBONATE-VITAMIN D PO Take by mouth. Reported on 03/08/2016 08/19/2015: Received from: [facility]  . dicyclomine (BENTYL) 20 MG tablet Take 20 mg by mouth every 6 (six) hours as needed for spasms.   Marland Kitchen ibuprofen (ADVIL,MOTRIN) 600 MG tablet Take 1 tablet (600 mg total) by mouth every 6 (six) hours as needed.   Marland Kitchen losartan (COZAAR) 100 MG tablet TAKE 1 TABLET BY MOUTH DAILY   . pantoprazole (PROTONIX) 40 MG tablet TAKE 1 TABLET (40 MG TOTAL) BY MOUTH DAILY.   Marland Kitchen promethazine (PHENERGAN) 25 MG tablet Take 25 mg by mouth every 6 (six) hours as needed for nausea or vomiting.    No facility-administered encounter medications on file as of 11/12/2016.     Allergies  Allergen Reactions  . Loratadine   . Amoxicillin Hives and Rash  . Daypro  [Oxaprozin] Rash  . Sulfa Antibiotics Rash    presuptive reaction    Review of Systems  Constitutional: Negative.   Eyes: Negative.   Respiratory: Negative.   Cardiovascular: Negative.   Gastrointestinal: Negative.   Musculoskeletal: Positive for back pain and joint pain.  Skin: Negative.   Neurological: Negative.   Endo/Heme/Allergies: Negative.   Psychiatric/Behavioral: Negative.     Objective:  BP (!) 174/76   Pulse 80   Resp 16   Wt 134 lb (60.8 kg)   BMI 22.30 kg/m  Physical Exam  Constitutional: She is oriented to person, place, and time and well-developed, well-nourished, and in no distress.  HENT:  Head: Normocephalic and atraumatic.  Right Ear: External ear normal.  Left Ear: External ear normal.  Nose: Nose normal.  Eyes: Conjunctivae are normal. Pupils are equal, round, and reactive to light.  Neck: Normal range of motion. Neck supple.  Cardiovascular: Normal rate, regular rhythm, normal heart sounds and intact distal pulses.   No murmur heard. Pulmonary/Chest: Effort normal and breath sounds normal. No respiratory distress. She has no wheezes.  Abdominal: Soft. She exhibits no distension. There is no  tenderness.  Musculoskeletal: She exhibits no edema or tenderness.  Neurological: She is alert and oriented to person, place, and time. Gait normal.  Skin: Skin is warm and dry.  Psychiatric: Mood, memory, affect and judgment normal.    Assessment and Plan :  1. Essential hypertension Elevated today. Will follow for now.  Pt convinced her BP is ok. She may be right. 2. Gastro-esophageal reflux disease without esophagitis Stable on medication regimen. Discussed with patient trying to wean off Protonix. Re check on the next visit.  3. Female bladder prolapse 4.Health Maintenance Patient is due for Tdap, patient will check with insurance on the cost and will let us know. HPI, Exam and A&P transcribed under direction and in the presence of Miguel Aschoff, MD. I have done the exam and reviewed the chart and it is accurate to the best of my knowledge. Development worker, community has been used and  any errors in dictation or transcription are unintentional. Miguel Aschoff M.D. [facility]

## 2016-11-12 NOTE — Patient Instructions (Signed)
Please check with insurance on the cost of Tdap for you and you can let us know if you would like to get this done any time. If insurance does not cover this immunization and you do not want to pay out of pocket we can give you a Tetanus (Td) immunization alone as long as you have a cut or abrasion.

## 2016-11-19 ENCOUNTER — Ambulatory Visit: Payer: Medicare Other | Admitting: Family Medicine

## 2016-11-22 ENCOUNTER — Ambulatory Visit: Payer: Medicare Other

## 2016-11-22 ENCOUNTER — Ambulatory Visit: Payer: Medicare Other | Admitting: Family Medicine

## 2016-11-23 ENCOUNTER — Ambulatory Visit: Payer: Medicare Other

## 2016-11-29 ENCOUNTER — Ambulatory Visit (INDEPENDENT_AMBULATORY_CARE_PROVIDER_SITE_OTHER): Payer: Medicare Other

## 2016-11-29 ENCOUNTER — Ambulatory Visit
Admission: RE | Admit: 2016-11-29 | Discharge: 2016-11-29 | Disposition: A | Discharge status: Home / Self Care | Payer: Medicare Other | Source: Ambulatory Visit | Attending: Obstetrics and Gynecology | Admitting: Obstetrics and Gynecology

## 2016-11-29 VITALS — BP 158/49 | HR 72 | Temp 97.8°F | Ht 65.0 in | Wt 136.1 lb

## 2016-11-29 DIAGNOSIS — Z0000 Encounter for general adult medical examination without abnormal findings: Secondary | ICD-10-CM | POA: Diagnosis not present

## 2016-11-29 DIAGNOSIS — Z1231 Encounter for screening mammogram for malignant neoplasm of breast: Secondary | ICD-10-CM | POA: Diagnosis present

## 2016-11-29 NOTE — Progress Notes (Signed)
Subjective:   Bianca Malone is a 81 y.o. female who presents for Medicare Annual (Subsequent) preventive examination.  Review of Systems:  N/A  Cardiac Risk Factors include: advanced age (>6men, >66 women);hypertension     Objective:     Vitals: BP (!) 158/49 (BP Location: Right Arm)   Pulse 72   Temp 97.8 F (36.6 C) (Oral)   Ht 5\' 5"  (1.651 m)   Wt 136 lb 2 oz (61.7 kg)   BMI 22.65 kg/m   Body mass index is 22.65 kg/m.   Tobacco History  Smoking Status  . Never Smoker  Smokeless Tobacco  . Never Used     Counseling given: Not Answered   History reviewed. No pertinent past medical history. Past Surgical History:  Procedure Laterality Date  . ABDOMINAL ADHESION SURGERY     small bowel lysis of adhesions  . BREAST CYST ASPIRATION Left   . CARPAL TUNNEL RELEASE    . CYST EXCISION     thyroglossal duct cyst surgery  . HYSTEROSCOPY     Family History  Problem Relation Age of Onset  . Alzheimer's disease Mother   . Hypertension Mother   . Kidney failure Mother   . Hypertension Father   . CAD Father   . Heart disease Brother   . Anemia Brother   . Heart disease Brother   . Myelodysplastic syndrome Brother   . Breast cancer Paternal Aunt   . Prostate cancer Paternal Uncle    History  Sexual Activity  . Sexual activity: Yes  . Birth control/ protection: Condom    Outpatient Encounter Prescriptions as of 11/29/2016  Medication Sig  . aspirin 81 MG chewable tablet Chew by mouth.   Marland Kitchen CALCIUM CARBONATE-VITAMIN D PO Take by mouth. Reported on 03/08/2016  . dicyclomine (BENTYL) 20 MG tablet Take 20 mg by mouth every 6 (six) hours as needed for spasms.  Marland Kitchen ibuprofen (ADVIL,MOTRIN) 600 MG tablet Take 1 tablet (600 mg total) by mouth every 6 (six) hours as needed.  Marland Kitchen losartan (COZAAR) 100 MG tablet TAKE 1 TABLET BY MOUTH DAILY  . pantoprazole (PROTONIX) 40 MG tablet TAKE 1 TABLET (40 MG TOTAL) BY MOUTH DAILY.  Marland Kitchen promethazine (PHENERGAN) 25 MG tablet Take 25 mg  by mouth every 6 (six) hours as needed for nausea or vomiting.   No facility-administered encounter medications on file as of 11/29/2016.     Activities of Daily Living In your present state of health, do you have any difficulty performing the following activities: 11/29/2016 05/21/2016  Hearing? Y N  Vision? N N  Difficulty concentrating or making decisions? N N  Walking or climbing stairs? N N  Dressing or bathing? N N  Doing errands, shopping? N N  Preparing Food and eating ? N -  Using the Toilet? N -  In the past six months, have you accidently leaked urine? N -  Do you have problems with loss of bowel control? N -  Managing your Medications? N -  Managing your Finances? N -  Housekeeping or managing your Housekeeping? N -  Some recent data might be hidden    Patient Care Team: Jerrol Banana., MD as PCP - General (Family Medicine) Benjaman Kindler, MD as Consulting Physician (Obstetrics and Gynecology) Birder Robson, MD as Referring Physician (Ophthalmology) Murrell Redden, MD as Consulting Physician (Urology) Beverly Gust, MD as Consulting Physician (Otolaryngology)    Assessment:     Exercise Activities and Dietary recommendations Current Exercise  Habits: Home exercise routine, Type of exercise: walking (bicycle), Time (Minutes): > 60, Frequency (Times/Week): 3, Weekly Exercise (Minutes/Week): 0  Goals    . Increase water intake          Starting 11/29/16, I will increase my water intake to 5-6 glasses a day.      Fall Risk Fall Risk  11/29/2016 05/21/2016 08/22/2015  Falls in the past year? No No No   Depression Screen PHQ 2/9 Scores 11/29/2016 05/21/2016 08/22/2015  PHQ - 2 Score 0 0 0     Cognitive Function     6CIT Screen 11/29/2016  What Year? 0 points  What month? 0 points  What time? 0 points  Count back from 20 0 points  Months in reverse 0 points  Repeat phrase 2 points  Total Score 2    Immunization History  Administered Date(s)  Administered  . Influenza Split 11/04/2012  . Influenza, High Dose Seasonal PF 11/16/2014, 08/22/2015, 09/29/2016  . Influenza,inj,Quad PF,36+ Mos 10/14/2013  . Pneumococcal Conjugate-13 11/16/2014  . Pneumococcal Polysaccharide-23 03/13/2011  . Td 03/30/2004   Screening Tests Health Maintenance  Topic Date Due  . ZOSTAVAX  12/03/2016 (Originally 12/04/1996)  . TETANUS/TDAP  12/03/2016 (Originally 03/30/2014)  . INFLUENZA VACCINE  Completed  . DEXA SCAN  Completed  . PNA vac Low Risk Adult  Completed      Plan:  I have personally reviewed and addressed the Medicare Annual Wellness questionnaire and have noted the following in the patient's chart:  A. Medical and social history B. Use of alcohol, tobacco or illicit drugs  C. Current medications and supplements D. Functional ability and status E.  Nutritional status F.  Physical activity G. Advance directives H. List of other physicians I.  Hospitalizations, surgeries, and ER visits in previous 12 months J.  [place] such as hearing and vision if needed, cognitive and depression L. Referrals and appointments - none  In addition, I have reviewed and discussed with patient certain preventive protocols, quality metrics, and best practice recommendations. A written personalized care plan for preventive services as well as general preventive health recommendations were provided to patient.  See attached scanned questionnaire for additional information.   Signed,  Fabio Neighbors, LPN Nurse Health Advisor   MD Recommendations: Follow up on the tetanus and zostavax vaccine. Pt declined today, but is going tocheck with insurance about coverage.  I have reviewed the health advisors note, was  available for consultation and I agree with documentation and plan. Miguel Aschoff MD [facility]

## 2016-11-29 NOTE — Patient Instructions (Signed)
Bianca Malone , Thank you for taking time to come for your Medicare Wellness Visit. I appreciate your ongoing commitment to your health goals. Please review the following plan we discussed and let me know if I can assist you in the future.   These are the goals we discussed: Goals    . Increase water intake          Starting 11/29/16, I will increase my water intake to 5-6 glasses a day.       This is a list of the screening recommended for you and due dates:  Health Maintenance  Topic Date Due  . Shingles Vaccine  12/04/1996  . Tetanus Vaccine  03/30/2014  . Flu Shot  Completed  . DEXA scan (bone density measurement)  Completed  . Pneumonia vaccines  Completed   Preventive Care for Adults  A healthy lifestyle and preventive care can promote health and wellness. Preventive health guidelines for adults include the following key practices.  . A routine yearly physical is a good way to check with your health care provider about your health and preventive screening. It is a chance to share any concerns and updates on your health and to receive a thorough exam.  . Visit your dentist for a routine exam and preventive care every 6 months. Brush your teeth twice a day and floss once a day. Good oral hygiene prevents tooth decay and gum disease.  . The frequency of eye exams is based on your age, health, family medical history, use  of contact lenses, and other factors. Follow your health care provider's ecommendations for frequency of eye exams.  . Eat a healthy diet. Foods like vegetables, fruits, whole grains, low-fat dairy products, and lean protein foods contain the nutrients you need without too many calories. Decrease your intake of foods high in solid fats, added sugars, and salt. Eat the right amount of calories for you. Get information about a proper diet from your health care provider, if necessary.  . Regular physical exercise is one of the most important things you can do for your  health. Most adults should get at least 150 minutes of moderate-intensity exercise (any activity that increases your heart rate and causes you to sweat) each week. In addition, most adults need muscle-strengthening exercises on 2 or more days a week.  Silver Sneakers may be a benefit available to you. To determine eligibility, you may visit the website: www.silversneakers.com or contact program at 571-885-3814 Mon-Fri between 8AM-8PM.   . Maintain a healthy weight. The body mass index (BMI) is a screening tool to identify possible weight problems. It provides an estimate of body fat based on height and weight. Your health care provider can find your BMI and can help you achieve or maintain a healthy weight.   For adults 20 years and older: ? A BMI below 18.5 is considered underweight. ? A BMI of 18.5 to 24.9 is normal. ? A BMI of 25 to 29.9 is considered overweight. ? A BMI of 30 and above is considered obese.   . Maintain normal blood lipids and cholesterol levels by exercising and minimizing your intake of saturated fat. Eat a balanced diet with plenty of fruit and vegetables. Blood tests for lipids and cholesterol should begin at age 43 and be repeated every 5 years. If your lipid or cholesterol levels are high, you are over 50, or you are at high risk for heart disease, you may need your cholesterol levels checked more frequently. Ongoing  high lipid and cholesterol levels should be treated with medicines if diet and exercise are not working.  . If you smoke, find out from your health care provider how to quit. If you do not use tobacco, please do not start.  . If you choose to drink alcohol, please do not consume more than 2 drinks per day. One drink is considered to be 12 ounces (355 mL) of beer, 5 ounces (148 mL) of wine, or 1.5 ounces (44 mL) of liquor.  . If you are 82-45 years old, ask your health care provider if you should take aspirin to prevent strokes.  . Use sunscreen. Apply  sunscreen liberally and repeatedly throughout the day. You should seek shade when your shadow is shorter than you. Protect yourself by wearing long sleeves, pants, a wide-brimmed hat, and sunglasses year round, whenever you are outdoors.  . Once a month, do a whole body skin exam, using a mirror to look at the skin on your back. Tell your health care provider of new moles, moles that have irregular borders, moles that are larger than a pencil eraser, or moles that have changed in shape or color.

## 2016-12-18 ENCOUNTER — Encounter: Payer: Self-pay | Admitting: Family Medicine

## 2016-12-18 ENCOUNTER — Ambulatory Visit (INDEPENDENT_AMBULATORY_CARE_PROVIDER_SITE_OTHER): Payer: Medicare Other | Admitting: Family Medicine

## 2016-12-18 ENCOUNTER — Telehealth: Payer: Self-pay

## 2016-12-18 VITALS — BP 158/66 | HR 80 | Temp 97.5°F | Resp 16 | Wt 132.2 lb

## 2016-12-18 DIAGNOSIS — B349 Viral infection, unspecified: Secondary | ICD-10-CM | POA: Diagnosis not present

## 2016-12-18 MED ORDER — HYDROCODONE-HOMATROPINE 5-1.5 MG/5ML PO SYRP: 5.0000 mL | ORAL_SOLUTION | Freq: Three times a day (TID) | ORAL | 0 refills | Status: DC | PRN

## 2016-12-18 MED ORDER — OSELTAMIVIR PHOSPHATE 75 MG PO CAPS: 75.0000 mg | ORAL_CAPSULE | Freq: Two times a day (BID) | ORAL | 0 refills | Status: DC

## 2016-12-18 NOTE — Patient Instructions (Addendum)

## 2016-12-18 NOTE — Progress Notes (Signed)
Patient: Bianca Malone Female    DOB: 10/23/1935   82 y.o.   MRN: [id number] Visit Date: 12/18/2016  Today's Provider: Vernie Murders, PA   Chief Complaint  Patient presents with  . URI   Subjective:    URI   This is a new problem. The current episode started yesterday. The problem has been unchanged. Maximum temperature: Some chills with temperature almost 100. Associated symptoms include congestion, coughing, headaches, nausea, rhinorrhea and a sore throat. Pertinent negatives include no diarrhea or vomiting. Associated symptoms comments: Hoarseness, chills, fever, sorethroat and headache made it difficult for her to sleep last night.. She has tried NSAIDs for the symptoms. The treatment provided mild relief.   Patient Active Problem List   Diagnosis Date Noted  . Allergic rhinitis 08/19/2015  . Anxiety 08/19/2015  . Intestinal obstruction 08/19/2015  . Arthritis 08/19/2015  . Osteoarthrosis 08/19/2015  . Gastro-esophageal reflux disease without esophagitis 08/19/2015  . 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 BP (high blood pressure) 08/19/2015  . Mitral valve disorder 08/19/2015  . Post menopausal syndrome 08/19/2015  . Thyroid nodule 08/19/2015   Past Surgical History:  Procedure Laterality Date  . ABDOMINAL ADHESION SURGERY     small bowel lysis of adhesions  . BREAST CYST ASPIRATION Left   . CARPAL TUNNEL RELEASE    . CYST EXCISION     thyroglossal duct cyst surgery  . HYSTEROSCOPY     Family History  Problem Relation Age of Onset  . Alzheimer's disease Mother   . Hypertension Mother   . Kidney failure Mother   . Hypertension Father   . CAD Father   . Heart disease Brother   . Anemia Brother   . Heart disease Brother   . Myelodysplastic syndrome Brother   . Breast cancer Paternal Aunt   . Prostate cancer Paternal Uncle    Allergies  Allergen Reactions  . Loratadine   . Amoxicillin Hives and Rash  . Daypro  [Oxaprozin] Rash  .  Sulfa Antibiotics Rash    presuptive reaction     Previous Medications   ASPIRIN 81 MG CHEWABLE TABLET    Chew by mouth.    CALCIUM CARBONATE-VITAMIN D PO    Take by mouth. Reported on 03/08/2016   DICYCLOMINE (BENTYL) 20 MG TABLET    Take 20 mg by mouth every 6 (six) hours as needed for spasms.   IBUPROFEN (ADVIL,MOTRIN) 600 MG TABLET    Take 1 tablet (600 mg total) by mouth every 6 (six) hours as needed.   LOSARTAN (COZAAR) 100 MG TABLET    TAKE 1 TABLET BY MOUTH DAILY   PANTOPRAZOLE (PROTONIX) 40 MG TABLET    TAKE 1 TABLET (40 MG TOTAL) BY MOUTH DAILY.   PROMETHAZINE (PHENERGAN) 25 MG TABLET    Take 25 mg by mouth every 6 (six) hours as needed for nausea or vomiting.    Review of Systems  Constitutional: Negative.   HENT: Positive for congestion, rhinorrhea and sore throat.   Respiratory: Positive for cough.   Cardiovascular: Negative.   Gastrointestinal: Positive for nausea. Negative for diarrhea and vomiting.  Neurological: Positive for headaches.    Social History  Substance Use Topics  . Smoking status: Never Smoker  . Smokeless tobacco: Never Used  . Alcohol use 1.2 - 1.8 oz/week    2 - 3 Glasses of wine per week   Objective:   BP (!) 158/66 (BP Location: Right Arm, Patient Position: Sitting, Cuff Size:  Normal)   Pulse 80   Temp 97.5 F (36.4 C) (Oral)   Resp 16   Wt 132 lb 3.2 oz (60 kg)   SpO2 98%   BMI 22.00 kg/m   Physical Exam  Constitutional: She is oriented to person, place, and time. She appears well-developed and well-nourished. No distress.  HENT:  Head: Normocephalic and atraumatic.  Right Ear: Hearing normal.  Left Ear: Hearing normal.  Nose: Nose normal.  Slight cobblestoning of posterior pharynx without exudates. Voice hoarse.  Eyes: Conjunctivae and lids are normal. Right eye exhibits no discharge. Left eye exhibits no discharge. No scleral icterus.  Neck: Neck supple.  Cardiovascular: Normal rate and regular rhythm.   Pulmonary/Chest: Effort  normal. No respiratory distress.  Abdominal: Soft. Bowel sounds are normal.  Musculoskeletal: Normal range of motion.  Neurological: She is alert and oriented to person, place, and time.  Skin: Skin is intact. No lesion and no rash noted.  Psychiatric: She has a normal mood and affect. Her speech is normal and behavior is normal. Thought content normal.      Assessment & Plan:     1. Viral syndrome Onset yesterday with fever, cough, sore throat, hoarseness and headache. Suspect influenza. Will treat with Tamiflu and Hycodan. Increase fluid intake and Advil prn fever. Home to rest. Recheck prn. - oseltamivir (TAMIFLU) 75 MG capsule; Take 1 capsule (75 mg total) by mouth 2 (two) times daily.  Dispense: 10 capsule; Refill: 0 - HYDROcodone-homatropine (HYCODAN) 5-1.5 MG/5ML syrup; Take 5 mLs by mouth every 8 (eight) hours as needed for cough.  Dispense: 120 mL; Refill: 0

## 2016-12-18 NOTE — Telephone Encounter (Signed)
Patient's husband called saying that for the last week, patient has had cough and congestion along with a sore throat. He also mentions that she has had a headache that has not been relieved with aspirin. It was recommended fby Dr. Rosanna Randy that the patient be seen today. Appt scheduled with Simona Huh later this morning.

## 2016-12-19 ENCOUNTER — Ambulatory Visit: Payer: Medicare Other | Admitting: Family Medicine

## 2017-04-01 ENCOUNTER — Other Ambulatory Visit: Payer: Self-pay | Admitting: Family Medicine

## 2017-05-13 ENCOUNTER — Encounter: Payer: Self-pay | Admitting: Family Medicine

## 2017-05-13 ENCOUNTER — Ambulatory Visit (INDEPENDENT_AMBULATORY_CARE_PROVIDER_SITE_OTHER): Payer: Medicare Other | Admitting: Family Medicine

## 2017-05-13 VITALS — BP 152/60 | HR 60 | Temp 97.9°F | Resp 16 | Wt 132.0 lb

## 2017-05-13 DIAGNOSIS — M5430 Sciatica, unspecified side: Secondary | ICD-10-CM | POA: Diagnosis not present

## 2017-05-13 DIAGNOSIS — M549 Dorsalgia, unspecified: Secondary | ICD-10-CM

## 2017-05-13 DIAGNOSIS — I10 Essential (primary) hypertension: Secondary | ICD-10-CM

## 2017-05-13 DIAGNOSIS — K219 Gastro-esophageal reflux disease without esophagitis: Secondary | ICD-10-CM

## 2017-05-13 DIAGNOSIS — R079 Chest pain, unspecified: Secondary | ICD-10-CM | POA: Insufficient documentation

## 2017-05-13 MED ORDER — AMLODIPINE BESYLATE 2.5 MG PO TABS: 2.5000 mg | ORAL_TABLET | Freq: Every day | ORAL | 1 refills | Status: DC

## 2017-05-13 MED ORDER — LOSARTAN POTASSIUM 100 MG PO TABS: 100.0000 mg | ORAL_TABLET | Freq: Every day | ORAL | 1 refills | Status: DC

## 2017-05-13 NOTE — Progress Notes (Signed)
Patient: Bianca Malone Female    DOB: 09-Jul-1935   82 y.o.   MRN: [id number] Visit Date: 05/13/2017  Today's Provider: Wilhemena Durie, MD   Chief Complaint  Patient presents with  . Gastroesophageal Reflux  . Hypertension   Subjective:    Gastroesophageal Reflux  She complains of abdominal pain (Had a "Spasm" a little over a week ago.  She says it has resolved since.) and heartburn. She reports no chest pain, no coughing, no hoarse voice or no nausea. This is a chronic (Pt tried to come off Protonix, but had to restart secondary to the symptoms returning. ) problem. The problem has been gradually improving. She has tried a PPI for the symptoms. The treatment provided significant (Pt reports doing well now that she is back on Protonix.  Very seldom she has to take a Tums at night. ) relief.  Hypertension  This is a chronic problem. The problem is unchanged. Condition status: Blood pressures at home run around 120-150's/50-60's. Pertinent negatives include no chest pain, headaches, neck pain or palpitations. There are no compliance problems.    BP Readings from Last 3 Encounters:  05/13/17 (!) 152/60  12/18/16 (!) 158/66  11/29/16 (!) 158/49      Allergies  Allergen Reactions  . Loratadine   . Amoxicillin Hives and Rash  . Daypro  [Oxaprozin] Rash  . Sulfa Antibiotics Rash    presuptive reaction     Current Outpatient Prescriptions:  .  aspirin 81 MG chewable tablet, Chew by mouth. , Disp: , Rfl:  .  CALCIUM CARBONATE-VITAMIN D PO, Take by mouth. Reported on 03/08/2016, Disp: , Rfl:  .  dicyclomine (BENTYL) 20 MG tablet, Take 20 mg by mouth every 6 (six) hours as needed for spasms., Disp: , Rfl:  .  ibuprofen (ADVIL,MOTRIN) 600 MG tablet, Take 1 tablet (600 mg total) by mouth every 6 (six) hours as needed., Disp: 90 tablet, Rfl: 2 .  losartan (COZAAR) 100 MG tablet, TAKE 1 TABLET BY MOUTH DAILY, Disp: 30 tablet, Rfl: 11 .  pantoprazole (PROTONIX) 40 MG tablet, TAKE 1  TABLET (40 MG TOTAL) BY MOUTH DAILY., Disp: 90 tablet, Rfl: 3 .  promethazine (PHENERGAN) 25 MG tablet, Take 25 mg by mouth every 6 (six) hours as needed for nausea or vomiting., Disp: , Rfl:   Review of Systems  Constitutional: Negative.   HENT: Negative for hoarse voice.   Respiratory: Negative.  Negative for cough.   Cardiovascular: Negative for chest pain, palpitations and leg swelling.  Gastrointestinal: Positive for abdominal pain (Had a "Spasm" a little over a week ago.  She says it has resolved since.) and heartburn. Negative for abdominal distention, anal bleeding, blood in stool, constipation, diarrhea, nausea, rectal pain and vomiting.  Musculoskeletal: Positive for back pain and myalgias. Negative for arthralgias, gait problem, joint swelling, neck pain and neck stiffness.       Pt reports having leg pain from sciatica.    Neurological: Negative for dizziness, light-headedness and headaches.    Social History  Substance Use Topics  . Smoking status: Never Smoker  . Smokeless tobacco: Never Used  . Alcohol use 1.2 - 1.8 oz/week    2 - 3 Glasses of wine per week   Objective:   BP (!) 152/60 (BP Location: Right Arm, Patient Position: Sitting, Cuff Size: Normal)   Pulse 60   Temp 97.9 F (36.6 C) (Oral)   Resp 16   Wt 132 lb (59.9  kg)   BMI 21.97 kg/m  Vitals:   05/13/17 1008  BP: (!) 152/60  Pulse: 60  Resp: 16  Temp: 97.9 F (36.6 C)  TempSrc: Oral  Weight: 132 lb (59.9 kg)     Physical Exam  Constitutional: She is oriented to person, place, and time. She appears well-developed and well-nourished.  HENT:  Head: Normocephalic and atraumatic.  Right Ear: External ear normal.  Left Ear: External ear normal.  Nose: Nose normal.  Eyes: Conjunctivae are normal. No scleral icterus.  Neck: Normal range of motion. Neck supple. Carotid bruit is not present. No thyromegaly present.  Cardiovascular: Normal rate, regular rhythm, normal heart sounds and intact distal  pulses.   Pulmonary/Chest: Effort normal and breath sounds normal.  Abdominal: Soft. Bowel sounds are normal.  Musculoskeletal: Normal range of motion. She exhibits no edema.  Neurological: She is alert and oriented to person, place, and time. She has normal reflexes.  Skin: Skin is warm and dry.  Psychiatric: She has a normal mood and affect. Her behavior is normal. Thought content normal.        Assessment & Plan:     1. Essential hypertension Blood pressure has been elevated, will add Amlodipine 2.5mg  today.  Recheck in one month.   - amLODipine (NORVASC) 2.5 MG tablet; Take 1 tablet (2.5 mg total) by mouth daily.  Dispense: 90 tablet; Refill: 1 - losartan (COZAAR) 100 MG tablet; Take 1 tablet (100 mg total) by mouth daily.  Dispense: 90 tablet; Refill: 1  2. Gastro-esophageal reflux disease without esophagitis Stable since restarting Protonix.  Pt will follow up with Dr Allen Norris to see if an Endoscopy is warranted.  Pt already has a Colonoscopy scheduled for August 2018.   3. Back pain with sciatica Will monitor for now. Pt is still doing her home exercises given to her by PT.  Follow up on one month and consider a referral to Physiatry at that time.   Patient seen and examined by Miguel Aschoff, MD, and note scribed by Ashley Royalty, CMA     I have done the exam and reviewed the above chart and it is accurate to the best of my knowledge. Development worker, community has been used in this note in any air is in the dictation or transcription are unintentional.   Wilhemena Durie, MD  [place]

## 2017-06-10 ENCOUNTER — Ambulatory Visit (INDEPENDENT_AMBULATORY_CARE_PROVIDER_SITE_OTHER): Payer: Medicare Other | Admitting: Family Medicine

## 2017-06-10 VITALS — BP 152/60 | HR 82 | Resp 14 | Wt 132.0 lb

## 2017-06-10 DIAGNOSIS — F419 Anxiety disorder, unspecified: Secondary | ICD-10-CM | POA: Diagnosis not present

## 2017-06-10 DIAGNOSIS — I10 Essential (primary) hypertension: Secondary | ICD-10-CM

## 2017-06-10 DIAGNOSIS — K219 Gastro-esophageal reflux disease without esophagitis: Secondary | ICD-10-CM | POA: Diagnosis not present

## 2017-06-10 MED ORDER — METOPROLOL SUCCINATE ER 25 MG PO TB24: 25.0000 mg | ORAL_TABLET | Freq: Every day | ORAL | 3 refills | Status: DC

## 2017-06-10 NOTE — Patient Instructions (Signed)
DASH Eating Plan DASH stands for "Dietary Approaches to Stop Hypertension." The DASH eating plan is a healthy eating plan that has been shown to reduce high blood pressure (hypertension). It may also reduce your risk for type 2 diabetes, heart disease, and stroke. The DASH eating plan may also help with weight loss. What are tips for following this plan? General guidelines  Avoid eating more than 2,300 mg (milligrams) of salt (sodium) a day. If you have hypertension, you may need to reduce your sodium intake to 1,500 mg a day.  Limit alcohol intake to no more than 1 drink a day for nonpregnant women and 2 drinks a day for men. One drink equals 12 oz of beer, 5 oz of wine, or 1 oz of hard liquor.  Work with your health care provider to maintain a healthy body weight or to lose weight. Ask what an ideal weight is for you.  Get at least 30 minutes of exercise that causes your heart to beat faster (aerobic exercise) most days of the week. Activities may include walking, swimming, or biking.  Work with your health care provider or diet and nutrition specialist (dietitian) to adjust your eating plan to your individual calorie needs. Reading food labels  Check food labels for the amount of sodium per serving. Choose foods with less than 5 percent of the Daily Value of sodium. Generally, foods with less than 300 mg of sodium per serving fit into this eating plan.  To find whole grains, look for the word "whole" as the first word in the ingredient list. Shopping  Buy products labeled as "low-sodium" or "no salt added."  Buy fresh foods. Avoid canned foods and premade or frozen meals. Cooking  Avoid adding salt when cooking. Use salt-free seasonings or herbs instead of table salt or sea salt. Check with your health care provider or pharmacist before using salt substitutes.  Do not fry foods. Cook foods using healthy methods such as baking, boiling, grilling, and broiling instead.  Cook with  heart-healthy oils, such as olive, canola, soybean, or sunflower oil. Meal planning   Eat a balanced diet that includes: ? 5 or more servings of fruits and vegetables each day. At each meal, try to fill half of your plate with fruits and vegetables. ? Up to 6-8 servings of whole grains each day. ? Less than 6 oz of lean meat, poultry, or fish each day. A 3-oz serving of meat is about the same size as a deck of cards. One egg equals 1 oz. ? 2 servings of low-fat dairy each day. ? A serving of nuts, seeds, or beans 5 times each week. ? Heart-healthy fats. Healthy fats called Omega-3 fatty acids are found in foods such as flaxseeds and coldwater fish, like sardines, salmon, and mackerel.  Limit how much you eat of the following: ? Canned or prepackaged foods. ? Food that is high in trans fat, such as fried foods. ? Food that is high in saturated fat, such as fatty meat. ? Sweets, desserts, sugary drinks, and other foods with added sugar. ? Full-fat dairy products.  Do not salt foods before eating.  Try to eat at least 2 vegetarian meals each week.  Eat more home-cooked food and less restaurant, buffet, and fast food.  When eating at a restaurant, ask that your food be prepared with less salt or no salt, if possible. What foods are recommended? The items listed may not be a complete list. Talk with your dietitian about what   dietary choices are best for you. Grains Whole-grain or whole-wheat bread. Whole-grain or whole-wheat pasta. Brown rice. Oatmeal. Quinoa. Bulgur. Whole-grain and low-sodium cereals. Pita bread. Low-fat, low-sodium crackers. Whole-wheat flour tortillas. Vegetables Fresh or frozen vegetables (raw, steamed, roasted, or grilled). Low-sodium or reduced-sodium tomato and vegetable juice. Low-sodium or reduced-sodium tomato sauce and tomato paste. Low-sodium or reduced-sodium canned vegetables. Fruits All fresh, dried, or frozen fruit. Canned fruit in natural juice (without  added sugar). Meat and other protein foods Skinless chicken or turkey. Ground chicken or turkey. Pork with fat trimmed off. Fish and seafood. Egg whites. Dried beans, peas, or lentils. Unsalted nuts, nut butters, and seeds. Unsalted canned beans. Lean cuts of beef with fat trimmed off. Low-sodium, lean deli meat. Dairy Low-fat (1%) or fat-free (skim) milk. Fat-free, low-fat, or reduced-fat cheeses. Nonfat, low-sodium ricotta or cottage cheese. Low-fat or nonfat yogurt. Low-fat, low-sodium cheese. Fats and oils Soft margarine without trans fats. Vegetable oil. Low-fat, reduced-fat, or light mayonnaise and salad dressings (reduced-sodium). Canola, safflower, olive, soybean, and sunflower oils. Avocado. Seasoning and other foods Herbs. Spices. Seasoning mixes without salt. Unsalted popcorn and pretzels. Fat-free sweets. What foods are not recommended? The items listed may not be a complete list. Talk with your dietitian about what dietary choices are best for you. Grains Baked goods made with fat, such as croissants, muffins, or some breads. Dry pasta or rice meal packs. Vegetables Creamed or fried vegetables. Vegetables in a cheese sauce. Regular canned vegetables (not low-sodium or reduced-sodium). Regular canned tomato sauce and paste (not low-sodium or reduced-sodium). Regular tomato and vegetable juice (not low-sodium or reduced-sodium). Pickles. Olives. Fruits Canned fruit in a light or heavy syrup. Fried fruit. Fruit in cream or butter sauce. Meat and other protein foods Fatty cuts of meat. Ribs. Fried meat. Bacon. Sausage. Bologna and other processed lunch meats. Salami. Fatback. Hotdogs. Bratwurst. Salted nuts and seeds. Canned beans with added salt. Canned or smoked fish. Whole eggs or egg yolks. Chicken or turkey with skin. Dairy Whole or 2% milk, cream, and half-and-half. Whole or full-fat cream cheese. Whole-fat or sweetened yogurt. Full-fat cheese. Nondairy creamers. Whipped toppings.  Processed cheese and cheese spreads. Fats and oils Butter. Stick margarine. Lard. Shortening. Ghee. Bacon fat. Tropical oils, such as coconut, palm kernel, or palm oil. Seasoning and other foods Salted popcorn and pretzels. Onion salt, garlic salt, seasoned salt, table salt, and sea salt. Worcestershire sauce. Tartar sauce. Barbecue sauce. Teriyaki sauce. Soy sauce, including reduced-sodium. Steak sauce. Canned and packaged gravies. Fish sauce. Oyster sauce. Cocktail sauce. Horseradish that you find on the shelf. Ketchup. Mustard. Meat flavorings and tenderizers. Bouillon cubes. Hot sauce and Tabasco sauce. Premade or packaged marinades. Premade or packaged taco seasonings. Relishes. Regular salad dressings. Where to find more information:  National Heart, Lung, and Blood Institute: www.nhlbi.nih.gov  American Heart Association: www.heart.org Summary  The DASH eating plan is a healthy eating plan that has been shown to reduce high blood pressure (hypertension). It may also reduce your risk for type 2 diabetes, heart disease, and stroke.  With the DASH eating plan, you should limit salt (sodium) intake to 2,300 mg a day. If you have hypertension, you may need to reduce your sodium intake to 1,500 mg a day.  When on the DASH eating plan, aim to eat more fresh fruits and vegetables, whole grains, lean proteins, low-fat dairy, and heart-healthy fats.  Work with your health care provider or diet and nutrition specialist (dietitian) to adjust your eating plan to your individual   calorie needs. This information is not intended to replace advice given to you by your health care provider. Make sure you discuss any questions you have with your health care provider. Document Released: 11/08/2011 Document Revised: 11/12/2016 Document Reviewed: 11/12/2016 Elsevier Interactive Patient Education  2017 Elsevier Inc.  

## 2017-06-10 NOTE — Progress Notes (Signed)
Bianca Malone  MRN: [id number] DOB: 1935/04/20  Subjective:  HPI  Patient is here for 1 month follow up on b/p. At last visit started patient on Amlodipine 2.5 mg, patient has been taking this medication and has not noticed much of a difference in her b/p readings. Readings have been 130s-140s/50s-60s, a couple of times reading was 150-152. She has noticed swelling in her feet and hands since been on this medication. BP Readings from Last 3 Encounters:  06/10/17 (!) 152/60  05/13/17 (!) 152/60  12/18/16 (!) 158/66    Patient Active Problem List   Diagnosis Date Noted  . Back pain with sciatica 05/13/2017  . Allergic rhinitis 08/19/2015  . Anxiety 08/19/2015  . Intestinal obstruction ([place]) 08/19/2015  . Arthritis 08/19/2015  . Osteoarthrosis 08/19/2015  . Gastro-esophageal reflux disease without esophagitis 08/19/2015  . 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 BP (high blood pressure) 08/19/2015  . Mitral valve disorder 08/19/2015  . Post menopausal syndrome 08/19/2015  . Thyroid nodule 08/19/2015    No past medical history on file.  Social History   Social History  . Marital status: Married    Spouse name: N/A  . Number of children: N/A  . Years of education: N/A   Occupational History  . Not on file.   Social History Main Topics  . Smoking status: Never Smoker  . Smokeless tobacco: Never Used  . Alcohol use 1.2 - 1.8 oz/week    2 - 3 Glasses of wine per week  . Drug use: No  . Sexual activity: Yes    Birth control/ protection: Condom   Other Topics Concern  . Not on file   Social History Narrative  . No narrative on file    Outpatient Encounter Prescriptions as of 06/10/2017  Medication Sig Note  . amLODipine (NORVASC) 2.5 MG tablet Take 1 tablet (2.5 mg total) by mouth daily.   Marland Kitchen aspirin 81 MG chewable tablet Chew by mouth.  08/19/2015: Received from: [facility]  . CALCIUM CARBONATE-VITAMIN D PO Take by mouth.  Reported on 03/08/2016 08/19/2015: Received from: [facility]  . dicyclomine (BENTYL) 20 MG tablet Take 20 mg by mouth every 6 (six) hours as needed for spasms.   Marland Kitchen ibuprofen (ADVIL,MOTRIN) 600 MG tablet Take 1 tablet (600 mg total) by mouth every 6 (six) hours as needed.   Marland Kitchen losartan (COZAAR) 100 MG tablet Take 1 tablet (100 mg total) by mouth daily.   . pantoprazole (PROTONIX) 40 MG tablet TAKE 1 TABLET (40 MG TOTAL) BY MOUTH DAILY.   Marland Kitchen promethazine (PHENERGAN) 25 MG tablet Take 25 mg by mouth every 6 (six) hours as needed for nausea or vomiting.    No facility-administered encounter medications on file as of 06/10/2017.     Allergies  Allergen Reactions  . Loratadine   . Amoxicillin Hives and Rash  . Daypro  [Oxaprozin] Rash  . Sulfa Antibiotics Rash    presuptive reaction    Review of Systems  Constitutional: Negative.   Respiratory: Negative.   Cardiovascular: Negative.   Musculoskeletal: Positive for back pain.       Hands and feet swelling    Objective:  BP (!) 152/60   Pulse 82   Resp 14   Wt 132 lb (59.9 kg)   BMI 21.97 kg/m   Physical Exam  Constitutional: She is oriented to person, place, and time and well-developed, well-nourished, and in no distress.  HENT:  Head: Normocephalic  and atraumatic.  Eyes: Conjunctivae are normal. Pupils are equal, round, and reactive to light.  Neck: Normal range of motion. Neck supple.  Cardiovascular: Normal rate, regular rhythm, normal heart sounds and intact distal pulses.  Exam reveals no gallop.   No murmur heard. Pulmonary/Chest: Effort normal and breath sounds normal. No respiratory distress. She has no wheezes.  Neurological: She is alert and oriented to person, place, and time.   Assessment and Plan :  1. Essential hypertension Not better. Stop Amlodipine. Try Metoprolol, second choice HCTZ but patient is allergic to sulfa. Re check in 1 month. Continue Losartan.  2. Gastro-esophageal reflux disease  without esophagitis Stable 3.GAD  HPI, Exam and A&P transcribed by Theressa Millard, RMA under direction and in the presence of Miguel Aschoff, MD. I have done the exam and reviewed the chart and it is accurate to the best of my knowledge. Development worker, community has been used and  any errors in dictation or transcription are unintentional. Miguel Aschoff M.D. [facility]

## 2017-06-12 ENCOUNTER — Telehealth: Payer: Self-pay | Admitting: Family Medicine

## 2017-06-12 NOTE — Telephone Encounter (Signed)
Patient advised. Patient states she is feeling better now. Patient states her b/p earlier today was 157/62 and heart rate 49. She is feeling better. I asked patient to push fluids and to call back tomorrow is heart rate below 60 still. Any other suggestions? She is not having chest pain or pressure, sounded ok on the phone. She said she may take Antivert medication she has for dizziness because that is still present a little.

## 2017-06-12 NOTE — Telephone Encounter (Signed)
We added Metoprolol on her last visit and stopped Amlodipine-aa

## 2017-06-12 NOTE — Telephone Encounter (Signed)
Pt states she seen Dr Rosanna Randy on Monday and he changed her blood pressure medication.  Pt states last night when she went to bed she started feeling dizzy.  Pt is feeling a little better this morning but still having some dizziness when moving around or lying down.  Pt is also having nausea.  Please advise.  WP#[phone]/[id number]

## 2017-06-12 NOTE — Telephone Encounter (Signed)
Push fluids and monitor for now.If persists will need to be checked.

## 2017-06-13 NOTE — Telephone Encounter (Signed)
Spoke with patient and advised her to stop Metoprolol. Patient mentioned that she was not taking Losartan. Advised patient that she was not suppose to have stopped Losartan and the other medication was just an addition. Patient was under the impression from June visit that she was suppose to have stopped Losartan. Advised patient that I guess it was a miscommunication and I apologized. Patient understood to take Losartan daily and to push fluids and to follow up in 1 month as scheduled.-aa

## 2017-06-13 NOTE — Telephone Encounter (Signed)
Pt called back this morning because her heart rate is still in the 40's. It was 46 and her BP is 154/57. This was at 8:00. She has not taken any of her BP meds this morning. She is still feeling better with the dizziness but says she still just does not feel herself. Please advise.   [phone] cell phone number

## 2017-06-13 NOTE — Telephone Encounter (Signed)
Stop metoprolol completely.Push fluids.If worried she may need to be seen

## 2017-07-15 ENCOUNTER — Ambulatory Visit (INDEPENDENT_AMBULATORY_CARE_PROVIDER_SITE_OTHER): Payer: Medicare Other | Admitting: Family Medicine

## 2017-07-15 VITALS — BP 128/62 | HR 64 | Temp 97.5°F | Resp 16 | Wt 133.0 lb

## 2017-07-15 DIAGNOSIS — I10 Essential (primary) hypertension: Secondary | ICD-10-CM

## 2017-07-15 DIAGNOSIS — G629 Polyneuropathy, unspecified: Secondary | ICD-10-CM | POA: Diagnosis not present

## 2017-07-15 DIAGNOSIS — M5430 Sciatica, unspecified side: Secondary | ICD-10-CM

## 2017-07-15 DIAGNOSIS — M549 Dorsalgia, unspecified: Secondary | ICD-10-CM | POA: Diagnosis not present

## 2017-07-15 NOTE — Progress Notes (Signed)
Bianca Malone  MRN: [id number] DOB: 01-22-1935  Subjective:  HPI   The patient is an 82 year old female who presents for follow up of her hypertension.  She was last seen on 06/10/17.  At that time her Amlodipine was causing swelling and the patient was put Metoprolol.  She was on that for only a few days and her pressure was going too low.  She was instructed to go back on the Losartan only. The patient brings in her reading for the last month.  Her readings have improved over the course of the month and her readings that last couple of weeks have ranged mostly in the 120s over 50s-60s.    Patient Active Problem List   Diagnosis Date Noted  . Back pain with sciatica 05/13/2017  . Allergic rhinitis 08/19/2015  . Anxiety 08/19/2015  . Intestinal obstruction ([place]) 08/19/2015  . Arthritis 08/19/2015  . Osteoarthrosis 08/19/2015  . Gastro-esophageal reflux disease without esophagitis 08/19/2015  . 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 BP (high blood pressure) 08/19/2015  . Mitral valve disorder 08/19/2015  . Post menopausal syndrome 08/19/2015  . Thyroid nodule 08/19/2015    No past medical history on file.  Social History   Social History  . Marital status: Married    Spouse name: N/A  . Number of children: N/A  . Years of education: N/A   Occupational History  . Not on file.   Social History Main Topics  . Smoking status: Never Smoker  . Smokeless tobacco: Never Used  . Alcohol use 1.2 - 1.8 oz/week    2 - 3 Glasses of wine per week  . Drug use: No  . Sexual activity: Yes    Birth control/ protection: Condom   Other Topics Concern  . Not on file   Social History Narrative  . No narrative on file    Outpatient Encounter Prescriptions as of 07/15/2017  Medication Sig Note  . aspirin 81 MG chewable tablet Chew by mouth.  08/19/2015: Received from: [facility]  . CALCIUM CARBONATE-VITAMIN D PO Take by mouth. Reported on  03/08/2016 08/19/2015: Received from: [facility]  . dicyclomine (BENTYL) 20 MG tablet Take 20 mg by mouth every 6 (six) hours as needed for spasms.   Marland Kitchen ibuprofen (ADVIL,MOTRIN) 600 MG tablet Take 1 tablet (600 mg total) by mouth every 6 (six) hours as needed.   Marland Kitchen losartan (COZAAR) 100 MG tablet Take 1 tablet (100 mg total) by mouth daily.   . pantoprazole (PROTONIX) 40 MG tablet TAKE 1 TABLET (40 MG TOTAL) BY MOUTH DAILY.   Marland Kitchen promethazine (PHENERGAN) 25 MG tablet Take 25 mg by mouth every 6 (six) hours as needed for nausea or vomiting.    No facility-administered encounter medications on file as of 07/15/2017.     Allergies  Allergen Reactions  . Loratadine   . Amoxicillin Hives and Rash  . Daypro  [Oxaprozin] Rash  . Sulfa Antibiotics Rash    presuptive reaction    Review of Systems  Constitutional: Negative for fever and malaise/fatigue.  Eyes: Negative.   Respiratory: Positive for shortness of breath (during her walks going up a hill). Negative for cough and wheezing.   Cardiovascular: Negative for chest pain, palpitations, orthopnea, claudication and leg swelling.  Gastrointestinal: Negative.   Skin: Negative.   Neurological: Negative for dizziness, weakness and headaches.  Endo/Heme/Allergies: Negative.   Psychiatric/Behavioral: Negative.     Objective:  BP 128/62 (BP  Location: Right Arm, Patient Position: Sitting, Cuff Size: Normal)   Pulse 64   Temp (!) 97.5 F (36.4 C) (Oral)   Resp 16   Wt 133 lb (60.3 kg)   BMI 22.13 kg/m   Physical Exam  Constitutional: She is oriented to person, place, and time and well-developed, well-nourished, and in no distress.  HENT:  Head: Normocephalic and atraumatic.  Eyes: Conjunctivae are normal. No scleral icterus.  Neck: No thyromegaly present.  Cardiovascular: Normal rate, regular rhythm and normal heart sounds.   Pulmonary/Chest: Effort normal and breath sounds normal.  Abdominal: Soft.  Neurological: She is  alert and oriented to person, place, and time. Gait normal. GCS score is 15.  Skin: Skin is warm and dry.  Psychiatric: Mood, memory, affect and judgment normal.    Assessment and Plan :   HTN Sciatica Neuropathy  I have done the exam and reviewed the chart and it is accurate to the best of my knowledge. Development worker, community has been used and  any errors in dictation or transcription are unintentional. Miguel Aschoff M.D. [facility]

## 2017-07-18 LAB — COMPREHENSIVE METABOLIC PANEL
A/G RATIO: 2 (ref 1.2–2.2)
ALBUMIN: 4.5 g/dL (ref 3.5–4.7)
ALT: 11 IU/L (ref 0–32)
AST: 24 IU/L (ref 0–40)
Alkaline Phosphatase: 69 IU/L (ref 39–117)
BUN / CREAT RATIO: 18 (ref 12–28)
BUN: 15 mg/dL (ref 8–27)
Bilirubin Total: 1.2 mg/dL (ref 0.0–1.2)
CALCIUM: 10.3 mg/dL (ref 8.7–10.3)
CO2: 25 mmol/L (ref 20–29)
CREATININE: 0.85 mg/dL (ref 0.57–1.00)
Chloride: 99 mmol/L (ref 96–106)
GFR calc Af Amer: 75 mL/min/{1.73_m2} (ref >59)
GFR, EST NON AFRICAN AMERICAN: 65 mL/min/{1.73_m2} (ref >59)
GLOBULIN, TOTAL: 2.3 g/dL (ref 1.5–4.5)
Glucose: 111 mg/dL — ABNORMAL HIGH (ref 65–99)
Potassium: 4.3 mmol/L (ref 3.5–5.2)
SODIUM: 138 mmol/L (ref 134–144)
Total Protein: 6.8 g/dL (ref 6.0–8.5)

## 2017-07-18 LAB — CBC WITH DIFFERENTIAL/PLATELET
BASOS: 1 %
Basophils Absolute: 0 10*3/uL (ref 0.0–0.2)
EOS (ABSOLUTE): 0.2 10*3/uL (ref 0.0–0.4)
EOS: 3 %
HEMATOCRIT: 38.1 % (ref 34.0–46.6)
HEMOGLOBIN: 12.7 g/dL (ref 11.1–15.9)
IMMATURE GRANULOCYTES: 0 %
Immature Grans (Abs): 0 10*3/uL (ref 0.0–0.1)
Lymphocytes Absolute: 1.4 10*3/uL (ref 0.7–3.1)
Lymphs: 25 %
MCH: 30.3 pg (ref 26.6–33.0)
MCHC: 33.3 g/dL (ref 31.5–35.7)
MCV: 91 fL (ref 79–97)
MONOCYTES: 7 %
MONOS ABS: 0.4 10*3/uL (ref 0.1–0.9)
Neutrophils Absolute: 3.4 10*3/uL (ref 1.4–7.0)
Neutrophils: 64 %
Platelets: 215 10*3/uL (ref 150–379)
RBC: 4.19 x10E6/uL (ref 3.77–5.28)
RDW: 13 % (ref 12.3–15.4)
WBC: 5.4 10*3/uL (ref 3.4–10.8)

## 2017-07-18 LAB — TSH: TSH: 2.19 u[IU]/mL (ref 0.450–4.500)

## 2017-07-22 ENCOUNTER — Encounter
Admission: RE | Disposition: A | Discharge status: Home / Self Care | Payer: Self-pay | Source: Ambulatory Visit | Attending: Unknown Physician Specialty

## 2017-07-22 ENCOUNTER — Ambulatory Visit
Admission: RE | Admit: 2017-07-22 | Discharge: 2017-07-22 | Disposition: A | Discharge status: Home / Self Care | Payer: Medicare Other | Source: Ambulatory Visit | Attending: Unknown Physician Specialty | Admitting: Unknown Physician Specialty

## 2017-07-22 ENCOUNTER — Ambulatory Visit: Payer: Medicare Other | Admitting: Anesthesiology

## 2017-07-22 ENCOUNTER — Encounter: Payer: Self-pay | Admitting: Anesthesiology

## 2017-07-22 DIAGNOSIS — Z1211 Encounter for screening for malignant neoplasm of colon: Secondary | ICD-10-CM | POA: Insufficient documentation

## 2017-07-22 DIAGNOSIS — K5730 Diverticulosis of large intestine without perforation or abscess without bleeding: Secondary | ICD-10-CM | POA: Insufficient documentation

## 2017-07-22 DIAGNOSIS — K648 Other hemorrhoids: Secondary | ICD-10-CM | POA: Insufficient documentation

## 2017-07-22 DIAGNOSIS — K317 Polyp of stomach and duodenum: Secondary | ICD-10-CM | POA: Diagnosis not present

## 2017-07-22 DIAGNOSIS — K219 Gastro-esophageal reflux disease without esophagitis: Secondary | ICD-10-CM | POA: Diagnosis not present

## 2017-07-22 DIAGNOSIS — Z79899 Other long term (current) drug therapy: Secondary | ICD-10-CM | POA: Diagnosis not present

## 2017-07-22 DIAGNOSIS — I10 Essential (primary) hypertension: Secondary | ICD-10-CM | POA: Insufficient documentation

## 2017-07-22 DIAGNOSIS — K449 Diaphragmatic hernia without obstruction or gangrene: Secondary | ICD-10-CM | POA: Diagnosis not present

## 2017-07-22 DIAGNOSIS — Z8249 Family history of ischemic heart disease and other diseases of the circulatory system: Secondary | ICD-10-CM | POA: Insufficient documentation

## 2017-07-22 HISTORY — PX: COLONOSCOPY WITH PROPOFOL: SHX5780

## 2017-07-22 HISTORY — DX: Unspecified osteoarthritis, unspecified site: M19.90

## 2017-07-22 HISTORY — PX: ESOPHAGOGASTRODUODENOSCOPY (EGD) WITH PROPOFOL: SHX5813

## 2017-07-22 HISTORY — DX: Gastro-esophageal reflux disease without esophagitis: K21.9

## 2017-07-22 HISTORY — DX: Essential (primary) hypertension: I10

## 2017-07-22 HISTORY — DX: Cystocele, unspecified: N81.10

## 2017-07-22 SURGERY — COLONOSCOPY WITH PROPOFOL
Anesthesia: General

## 2017-07-22 MED ORDER — PROPOFOL 500 MG/50ML IV EMUL
INTRAVENOUS | Status: DC | PRN
  Administered 2017-07-22: 140 ug/kg/min via INTRAVENOUS

## 2017-07-22 MED ORDER — PROPOFOL 500 MG/50ML IV EMUL
INTRAVENOUS | Status: AC
  Filled 2017-07-22: qty 50

## 2017-07-22 MED ORDER — SODIUM CHLORIDE 0.9 % IV SOLN
INTRAVENOUS | Status: DC
  Administered 2017-07-22: 13:00:00 via INTRAVENOUS

## 2017-07-22 MED ORDER — SODIUM CHLORIDE 0.9 % IV SOLN: INTRAVENOUS | Status: DC

## 2017-07-22 MED ORDER — PROPOFOL 10 MG/ML IV BOLUS
INTRAVENOUS | Status: DC | PRN
  Administered 2017-07-22: 100 mg via INTRAVENOUS

## 2017-07-22 NOTE — Anesthesia Preprocedure Evaluation (Addendum)
Anesthesia Evaluation  Patient identified by MRN, date of birth, ID band Patient awake    Reviewed: Allergy & Precautions, H&P , NPO status , Patient's Chart, lab work & pertinent test results, reviewed documented beta blocker date and time   History of Anesthesia Complications Negative for: history of anesthetic complications  Airway Mallampati: II  TM Distance: >3 FB Neck ROM: full    Dental  (+) Dental Advidsory Given   Pulmonary neg pulmonary ROS,           Cardiovascular Exercise Tolerance: Good hypertension, (-) angina(-) CAD, (-) Past MI, (-) Cardiac Stents and (-) CABG (-) dysrhythmias (-) Valvular Problems/Murmurs     Neuro/Psych negative neurological ROS  negative psych ROS   GI/Hepatic Neg liver ROS, GERD  ,  Endo/Other  negative endocrine ROS  Renal/GU negative Renal ROS  negative genitourinary   Musculoskeletal   Abdominal   Peds  Hematology negative hematology ROS (+)   Anesthesia Other Findings Past Medical History: No date: Arthritis No date: GERD (gastroesophageal reflux disease) No date: Hypertension No date: Vaginal prolapse   Reproductive/Obstetrics negative OB ROS                             Anesthesia Physical Anesthesia Plan  ASA: II  Anesthesia Plan: General   Post-op Pain Management:    Induction: Intravenous  PONV Risk Score and Plan: 3 and Propofol infusion  Airway Management Planned: Natural Airway and Nasal Cannula  Additional Equipment:   Intra-op Plan:   Post-operative Plan:   Informed Consent: I have reviewed the patients History and Physical, chart, labs and discussed the procedure including the risks, benefits and alternatives for the proposed anesthesia with the patient or authorized representative who has indicated his/her understanding and acceptance.   Dental Advisory Given  Plan Discussed with: Anesthesiologist, CRNA and  Surgeon  Anesthesia Plan Comments:        Anesthesia Quick Evaluation

## 2017-07-22 NOTE — Op Note (Signed)
[facility] Gastroenterology Patient Name: Bianca Malone Procedure Date: 07/22/2017 1:18 PM MRN: [id number] Account #: [id number] Date of Birth: [birth year] Admit Type: Outpatient Age: 82 Room: [facility] ENDO ROOM 3 Gender: Female Note Status: Finalized Procedure:            Colonoscopy Indications:          Screening for colorectal malignant neoplasm Providers:            Manya Silvas, MD Referring MD:         Janine Ores. Rosanna Randy, MD (Referring MD) Medicines:            Propofol per Anesthesia Complications:        No immediate complications. Procedure:            Pre-Anesthesia Assessment:                       - After reviewing the risks and benefits, the patient                        was deemed in satisfactory condition to undergo the                        procedure.                       After obtaining informed consent, the colonoscope was                        passed under direct vision. Throughout the procedure,                        the patient's blood pressure, pulse, and oxygen                        saturations were monitored continuously. The                        Colonoscope was introduced through the anus and                        advanced to the the cecum, identified by appendiceal                        orifice and ileocecal valve. The colonoscopy was                        somewhat difficult due to a tortuous colon. Successful                        completion of the procedure was aided by applying                        abdominal pressure. The patient tolerated the procedure                        well. The quality of the bowel preparation was                        excellent. Findings:      Many small-mouthed diverticula were found in the sigmoid colon.      Internal  hemorrhoids were found. The hemorrhoids were small and Grade I       (internal hemorrhoids that do not prolapse).      The exam was otherwise without abnormality. Impression:            - Diverticulosis in the sigmoid colon.                       - Internal hemorrhoids.                       - The examination was otherwise normal.                       - No specimens collected. Recommendation:       - The findings and recommendations were discussed with                        the patient's family. Manya Silvas, MD 07/22/2017 1:59:11 PM This report has been signed electronically. Number of Addenda: 0 Note Initiated On: 07/22/2017 1:18 PM Scope Withdrawal Time: 0 hours 6 minutes 3 seconds  Total Procedure Duration: 0 hours 17 minutes 50 seconds       [facility]

## 2017-07-22 NOTE — Transfer of Care (Signed)
Immediate Anesthesia Transfer of Care Note  Patient: Bianca Malone  Procedure(s) Performed: Procedure(s): COLONOSCOPY WITH PROPOFOL (N/A) ESOPHAGOGASTRODUODENOSCOPY (EGD) WITH PROPOFOL (N/A)  Patient Location: PACU and Endoscopy Unit  Anesthesia Type:General  Level of Consciousness: awake, alert  and oriented  Airway & Oxygen Therapy: Patient Spontanous Breathing and Patient connected to nasal cannula oxygen  Post-op Assessment: Report given to RN and Post -op Vital signs reviewed and stable  Post vital signs: Reviewed and stable  Last Vitals:  Vitals:   07/22/17 1230 07/22/17 1401  BP: (!) 197/62 (!) 124/48  Pulse: 71 68  Resp: 17 16  Temp: (!) 36.1 C   SpO2: 100% 99%    Last Pain:  Vitals:   07/22/17 1230  TempSrc: Tympanic         Complications: No apparent anesthesia complications

## 2017-07-22 NOTE — H&P (Signed)
Primary Care Physician:  Jerrol Banana., MD Primary Gastroenterologist:  Dr. Vira Agar  Pre-Procedure History & Physical: HPI:  Bianca Malone is a 82 y.o. female is here for an endoscopy and colonoscopy.   Past Medical History:  Diagnosis Date  . Arthritis   . GERD (gastroesophageal reflux disease)   . Hypertension   . Vaginal prolapse     Past Surgical History:  Procedure Laterality Date  . ABDOMINAL ADHESION SURGERY     small bowel lysis of adhesions  . BREAST CYST ASPIRATION Left   . CARPAL TUNNEL RELEASE    . COLONOSCOPY    . CYST EXCISION     thyroglossal duct cyst surgery  . HYSTEROSCOPY    . INCISION AND DRAINAGE / EXCISION THYROGLOSSAL CYST    . UPPER GASTROINTESTINAL ENDOSCOPY      Prior to Admission medications   Medication Sig Start Date End Date Taking? Authorizing Provider  polyethylene glycol (MIRALAX / GLYCOLAX) packet Take 17 g by mouth daily as needed.   Yes [provider]  aspirin 81 MG chewable tablet Chew by mouth.     [provider]  CALCIUM CARBONATE-VITAMIN D PO Take by mouth. Reported on 03/08/2016    [provider]  dicyclomine (BENTYL) 20 MG tablet Take 20 mg by mouth every 6 (six) hours as needed for spasms.    [provider]  ibuprofen (ADVIL,MOTRIN) 600 MG tablet Take 1 tablet (600 mg total) by mouth every 6 (six) hours as needed. 01/02/16   Jerrol Banana., MD  losartan (COZAAR) 100 MG tablet Take 1 tablet (100 mg total) by mouth daily. 05/13/17   Jerrol Banana., MD  pantoprazole (PROTONIX) 40 MG tablet TAKE 1 TABLET (40 MG TOTAL) BY MOUTH DAILY. 04/01/17   Jerrol Banana., MD  promethazine (PHENERGAN) 25 MG tablet Take 25 mg by mouth every 6 (six) hours as needed for nausea or vomiting.    [provider]    Allergies as of 05/09/2017 - Review Complete 12/18/2016  Allergen Reaction Noted  . Loratadine  08/19/2015  . Amoxicillin Hives and Rash 08/19/2015  . Daypro   [oxaprozin] Rash 08/19/2015  . Sulfa antibiotics Rash 08/19/2015    Family History  Problem Relation Age of Onset  . Alzheimer's disease Mother   . Hypertension Mother   . Kidney failure Mother   . Hypertension Father   . CAD Father   . Heart disease Brother   . Anemia Brother   . Heart disease Brother   . Myelodysplastic syndrome Brother   . Breast cancer Paternal Aunt   . Prostate cancer Paternal Uncle     Social History   Social History  . Marital status: Married    Spouse name: N/A  . Number of children: N/A  . Years of education: N/A   Occupational History  . Not on file.   Social History Main Topics  . Smoking status: Never Smoker  . Smokeless tobacco: Never Used  . Alcohol use 1.2 - 1.8 oz/week    2 - 3 Glasses of wine per week  . Drug use: No  . Sexual activity: Yes    Birth control/ protection: Condom   Other Topics Concern  . Not on file   Social History Narrative  . No narrative on file    Review of Systems: See HPI, otherwise negative ROS  Physical Exam: BP (!) 197/62   Pulse 71   Temp (!) 96.9 F (  36.1 C) (Tympanic)   Resp 17   Ht 5\' 5"  (1.651 m)   Wt 59 kg (130 lb)   SpO2 100%   BMI 21.63 kg/m  General:   Alert,  pleasant and cooperative in NAD Head:  Normocephalic and atraumatic. Neck:  Supple; no masses or thyromegaly. Lungs:  Clear throughout to auscultation.    Heart:  Regular rate and rhythm. Abdomen:  Soft, nontender and nondistended. Normal bowel sounds, without guarding, and without rebound.   Neurologic:  Alert and  oriented x4;  grossly normal neurologically.  Impression/Plan: [name] is here for an endoscopy and colonoscopy to be performed for screening colonoscopy and GERD and epigastric pain.  Risks, benefits, limitations, and alternatives regarding  endoscopy and colonoscopy have been reviewed with the patient.  Questions have been answered.  All parties agreeable.   Gaylyn Cheers, MD  07/22/2017, 1:17 PM

## 2017-07-22 NOTE — Op Note (Signed)
[facility] Gastroenterology Patient Name: Bianca Malone Procedure Date: 07/22/2017 1:19 PM MRN: [id number] Account #: [id number] Date of Birth: [birth year] Admit Type: Outpatient Age: 82 Room: [facility] ENDO ROOM 3 Gender: Female Note Status: Finalized Procedure:            Upper GI endoscopy Indications:          Epigastric abdominal pain, Heartburn Providers:            Manya Silvas, MD Referring MD:         Janine Ores. Rosanna Randy, MD (Referring MD) Medicines:            Propofol per Anesthesia Complications:        No immediate complications. Procedure:            Pre-Anesthesia Assessment:                       - After reviewing the risks and benefits, the patient                        was deemed in satisfactory condition to undergo the                        procedure.                       After obtaining informed consent, the endoscope was                        passed under direct vision. Throughout the procedure,                        the patient's blood pressure, pulse, and oxygen                        saturations were monitored continuously. The Endoscope                        was introduced through the mouth, and advanced to the                        second part of duodenum. The upper GI endoscopy was                        accomplished without difficulty. The patient tolerated                        the procedure well. Findings:      A small hiatal hernia was present. Biopsies were taken with a cold       forceps for histology.      Patchy mildly erythematous mucosa without bleeding was found in the       gastric body and in the gastric antrum. Biopsies were taken with a cold       forceps for histology. Biopsies were taken with a cold forceps for       Helicobacter pylori testing.      Multiple medium pedunculated and sessile polyps with no bleeding and no       stigmata of recent bleeding were found in the gastric body. Biopsies       were taken with  a cold forceps for histology.  The examined duodenum was normal. Impression:           - Small hiatal hernia. Biopsied.                       - Erythematous mucosa in the gastric body and antrum.                        Biopsied.                       - Multiple gastric polyps. Biopsied.                       - Normal examined duodenum. Recommendation:       - Await pathology results.                       - Perform a colonoscopy as previously scheduled. Manya Silvas, MD 07/22/2017 1:36:57 PM This report has been signed electronically. Number of Addenda: 0 Note Initiated On: 07/22/2017 1:19 PM      [facility]

## 2017-07-22 NOTE — Anesthesia Post-op Follow-up Note (Signed)
Anesthesia QCDR form completed.        

## 2017-07-22 NOTE — Anesthesia Postprocedure Evaluation (Signed)
Anesthesia Post Note  Patient: Bianca Malone  Procedure(s) Performed: Procedure(s) (LRB): COLONOSCOPY WITH PROPOFOL (N/A) ESOPHAGOGASTRODUODENOSCOPY (EGD) WITH PROPOFOL (N/A)  Patient location during evaluation: Endoscopy Anesthesia Type: General Level of consciousness: awake and alert Pain management: pain level controlled Vital Signs Assessment: post-procedure vital signs reviewed and stable Respiratory status: spontaneous breathing, nonlabored ventilation, respiratory function stable and patient connected to nasal cannula oxygen Cardiovascular status: blood pressure returned to baseline and stable Postop Assessment: no signs of nausea or vomiting Anesthetic complications: no     Last Vitals:  Vitals:   07/22/17 1411 07/22/17 1431  BP: 135/63 (!) 164/61  Pulse: 64 (!) 58  Resp: 18 18  Temp:    SpO2: 96% 100%    Last Pain:  Vitals:   07/22/17 1401  TempSrc: Tympanic                 Martha Clan

## 2017-07-23 ENCOUNTER — Encounter: Payer: Self-pay | Admitting: Unknown Physician Specialty

## 2017-07-25 LAB — SURGICAL PATHOLOGY

## 2017-08-09 ENCOUNTER — Encounter: Payer: Self-pay | Admitting: Family Medicine

## 2017-08-22 ENCOUNTER — Encounter: Payer: Self-pay | Admitting: Family Medicine

## 2017-09-05 ENCOUNTER — Ambulatory Visit (INDEPENDENT_AMBULATORY_CARE_PROVIDER_SITE_OTHER): Payer: Medicare Other

## 2017-09-05 DIAGNOSIS — Z23 Encounter for immunization: Secondary | ICD-10-CM | POA: Diagnosis not present

## 2017-11-12 ENCOUNTER — Other Ambulatory Visit: Payer: Self-pay | Admitting: Obstetrics and Gynecology

## 2017-11-12 ENCOUNTER — Other Ambulatory Visit: Payer: Self-pay | Admitting: Family Medicine

## 2017-11-12 DIAGNOSIS — Z1231 Encounter for screening mammogram for malignant neoplasm of breast: Secondary | ICD-10-CM

## 2017-11-20 ENCOUNTER — Telehealth: Payer: Self-pay

## 2017-11-20 NOTE — Telephone Encounter (Signed)
LM with husband to [facility] and schedule AWV with [facility] prior to CPE with PCP in 01/2018. -MM

## 2017-12-09 ENCOUNTER — Ambulatory Visit
Admission: RE | Admit: 2017-12-09 | Discharge: 2017-12-09 | Disposition: A | Discharge status: Home / Self Care | Payer: Medicare Other | Source: Ambulatory Visit | Attending: Family Medicine | Admitting: Family Medicine

## 2017-12-09 DIAGNOSIS — Z1231 Encounter for screening mammogram for malignant neoplasm of breast: Secondary | ICD-10-CM | POA: Insufficient documentation

## 2017-12-24 ENCOUNTER — Telehealth: Payer: Self-pay

## 2017-12-24 NOTE — Telephone Encounter (Signed)
Pt returned call and is scheduled for AWV with NHA on 01/02/18 @ 10 am. Thanks TNP

## 2017-12-24 NOTE — Telephone Encounter (Signed)
Called to inquire about setting up an AWV with pt. Spoke with husband who states he will have pt CB.  -MM

## 2017-12-30 ENCOUNTER — Other Ambulatory Visit: Payer: Self-pay | Admitting: Family Medicine

## 2017-12-30 DIAGNOSIS — I10 Essential (primary) hypertension: Secondary | ICD-10-CM

## 2017-12-30 MED ORDER — LOSARTAN POTASSIUM 100 MG PO TABS: 100.0000 mg | ORAL_TABLET | Freq: Every day | ORAL | 3 refills | Status: DC

## 2017-12-30 NOTE — Telephone Encounter (Signed)
Pharmacy requesting refill on losartan (COZAAR) 100 MG tablet    [facility] [address] [address]

## 2018-01-02 ENCOUNTER — Ambulatory Visit: Payer: Medicare Other

## 2018-01-15 ENCOUNTER — Ambulatory Visit (INDEPENDENT_AMBULATORY_CARE_PROVIDER_SITE_OTHER): Payer: Medicare Other | Admitting: Family Medicine

## 2018-01-15 ENCOUNTER — Encounter: Payer: Self-pay | Admitting: Family Medicine

## 2018-01-15 VITALS — BP 162/60 | HR 76 | Temp 97.7°F | Resp 16 | Ht 65.0 in | Wt 137.0 lb

## 2018-01-15 DIAGNOSIS — Z13220 Encounter for screening for lipoid disorders: Secondary | ICD-10-CM

## 2018-01-15 DIAGNOSIS — I10 Essential (primary) hypertension: Secondary | ICD-10-CM

## 2018-01-15 DIAGNOSIS — R1084 Generalized abdominal pain: Secondary | ICD-10-CM

## 2018-01-15 DIAGNOSIS — Z0000 Encounter for general adult medical examination without abnormal findings: Secondary | ICD-10-CM

## 2018-01-15 NOTE — Progress Notes (Signed)
Patient: Bianca Malone, Female    DOB: [birth year], 83 y.o.   MRN: [id number] Visit Date: 01/15/2018  Today's Provider: Wilhemena Durie, MD   Chief Complaint  Patient presents with  . Medicare Wellness   Subjective:     Complete Physical Bianca Malone is a 83 y.o. female. She feels well. She reports exercising couple of days a week. She reports she is sleeping well.  ----------------------------------------------------------- Colonoscopy- 08/22/17 Vira Agar, Diverticulosis, internal hemorrhoids, otherwise normal Mammogram- 12/09/17 normal BMD- 03/02/2015 osteopenia   Review of Systems  Constitutional: Negative.   HENT: Negative.   Eyes: Negative.   Respiratory: Positive for shortness of breath.   Cardiovascular: Negative.   Gastrointestinal: Negative.        Pt says she has chronic abdominal discomfort which has been evaluated multiple times by GI. She is concerned because brother just died at 21 with pancreatic cancer.  Her symptoms are vague--no weight loss.  Endocrine: Negative.   Genitourinary: Negative.   Musculoskeletal: Positive for back pain.  Skin: Negative.   Allergic/Immunologic: Negative.   Neurological: Negative.   Hematological: Negative.   Psychiatric/Behavioral: Negative.     Social History   Socioeconomic History  . Marital status: Married    Spouse name: Not on file  . Number of children: Not on file  . Years of education: Not on file  . Highest education level: Not on file  Social Needs  . Financial resource strain: Not on file  . Food insecurity - worry: Not on file  . Food insecurity - inability: Not on file  . Transportation needs - medical: Not on file  . Transportation needs - non-medical: Not on file  Occupational History  . Not on file  Tobacco Use  . Smoking status: Never Smoker  . Smokeless tobacco: Never Used  Substance and Sexual Activity  . Alcohol use: Yes    Alcohol/week: 1.2 - 1.8 oz    Types: 2 - 3 Glasses of wine per  week  . Drug use: No  . Sexual activity: Yes    Birth control/protection: Condom  Other Topics Concern  . Not on file  Social History Narrative  . Not on file    Past Medical History:  Diagnosis Date  . Arthritis   . GERD (gastroesophageal reflux disease)   . Hypertension   . Vaginal prolapse      Patient Active Problem List   Diagnosis Date Noted  . Back pain with sciatica 05/13/2017  . Allergic rhinitis 08/19/2015  . Anxiety 08/19/2015  . Intestinal obstruction ([place]) 08/19/2015  . Arthritis 08/19/2015  . Osteoarthrosis 08/19/2015  . Gastro-esophageal reflux disease without esophagitis 08/19/2015  . 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 BP (high blood pressure) 08/19/2015  . Mitral valve disorder 08/19/2015  . Post menopausal syndrome 08/19/2015  . Thyroid nodule 08/19/2015    Past Surgical History:  Procedure Laterality Date  . ABDOMINAL ADHESION SURGERY     small bowel lysis of adhesions  . BREAST CYST ASPIRATION Left   . CARPAL TUNNEL RELEASE    . COLONOSCOPY    . COLONOSCOPY WITH PROPOFOL N/A 07/22/2017   Procedure: COLONOSCOPY WITH PROPOFOL;  Surgeon: Manya Silvas, MD;  Location: [facility] ENDOSCOPY;  Service: Endoscopy;  Laterality: N/A;  . CYST EXCISION     thyroglossal duct cyst surgery  . ESOPHAGOGASTRODUODENOSCOPY (EGD) WITH PROPOFOL N/A 07/22/2017   Procedure: ESOPHAGOGASTRODUODENOSCOPY (EGD) WITH PROPOFOL;  Surgeon: Manya Silvas, MD;  Location: [facility] ENDOSCOPY;  Service: Endoscopy;  Laterality: N/A;  . HYSTEROSCOPY    . INCISION AND DRAINAGE / EXCISION THYROGLOSSAL CYST    . UPPER GASTROINTESTINAL ENDOSCOPY      Her family history includes Alzheimer's disease in her mother; Anemia in her brother; Breast cancer in her paternal aunt; CAD in her father; Heart disease in her brother and brother; Hypertension in her father and mother; Kidney failure in her mother; Myelodysplastic syndrome in her brother; Prostate cancer in her  paternal uncle.      Current Outpatient Medications:  .  aspirin 81 MG chewable tablet, Chew by mouth. , Disp: , Rfl:  .  CALCIUM CARBONATE-VITAMIN D PO, Take by mouth. Reported on 03/08/2016, Disp: , Rfl:  .  dicyclomine (BENTYL) 20 MG tablet, Take 20 mg by mouth every 6 (six) hours as needed for spasms., Disp: , Rfl:  .  ibuprofen (ADVIL,MOTRIN) 600 MG tablet, Take 1 tablet (600 mg total) by mouth every 6 (six) hours as needed., Disp: 90 tablet, Rfl: 2 .  losartan (COZAAR) 100 MG tablet, Take 1 tablet (100 mg total) by mouth daily., Disp: 90 tablet, Rfl: 3 .  pantoprazole (PROTONIX) 40 MG tablet, TAKE 1 TABLET (40 MG TOTAL) BY MOUTH DAILY., Disp: 90 tablet, Rfl: 3 .  polyethylene glycol (MIRALAX / GLYCOLAX) packet, Take 17 g by mouth daily as needed., Disp: , Rfl:  .  promethazine (PHENERGAN) 25 MG tablet, Take 25 mg by mouth every 6 (six) hours as needed for nausea or vomiting., Disp: , Rfl:   Patient Care Team: Jerrol Banana., MD as PCP - General (Family Medicine) Benjaman Kindler, MD as Consulting Physician (Obstetrics and Gynecology) Birder Robson, MD as Referring Physician (Ophthalmology) Murrell Redden, MD as Consulting Physician (Urology) Beverly Gust, MD as Consulting Physician (Otolaryngology)     Objective:   Vitals: BP (!) 162/60 (BP Location: Left Arm, Patient Position: Sitting, Cuff Size: Normal)   Pulse 76   Temp 97.7 F (36.5 C) (Oral)   Resp 16   Ht 5\' 5"  (1.651 m)   Wt 137 lb (62.1 kg)   BMI 22.80 kg/m   Physical Exam  Activities of Daily Living In your present state of health, do you have any difficulty performing the following activities: 01/15/2018  Hearing? Y  Vision? N  Difficulty concentrating or making decisions? N  Walking or climbing stairs? N  Dressing or bathing? N  Doing errands, shopping? N  Some recent data might be hidden    Fall Risk Assessment Fall Risk  01/15/2018 11/29/2016 05/21/2016 08/22/2015  Falls in the past year? No  No No No     Depression Screen PHQ 2/9 Scores 01/15/2018 01/15/2018 11/29/2016 05/21/2016  PHQ - 2 Score 0 0 0 0  PHQ- 9 Score 0 - - -    Cognitive Testing - 6-CIT  Correct? Score   What year is it? yes 0 0 or 4  What month is it? yes 0 0 or 3  Memorize:    Pia Mau,  44,  [place],      What time is it? (within 1 hour) yes 0 0 or 3  Count backwards from 20 yes 0 0, 2, or 4  Name the months of the year yes 0 0, 2, or 4  Repeat name & address above yes 4 0, 2, 4, 6, 8, or 10       TOTAL SCORE  4/28   Interpretation:  Normal  Normal (0-7) Abnormal (8-28)       Assessment & Plan:    Annual Physical Reviewed patient's Family Medical History Reviewed and updated list of patient's medical providers Assessment of cognitive impairment was done Assessed patient's functional ability Established a written schedule for health screening [place] Completed and Reviewed  Exercise Activities and Dietary recommendations Goals    None      Immunization History  Administered Date(s) Administered  . Influenza Split 11/04/2012  . Influenza, High Dose Seasonal PF 11/16/2014, 08/22/2015, 09/29/2016, 09/05/2017  . Influenza,inj,Quad PF,6+ Mos 10/14/2013  . Pneumococcal Conjugate-13 11/16/2014  . Pneumococcal Polysaccharide-23 03/13/2011  . Td 03/30/2004    Health Maintenance  Topic Date Due  . TETANUS/TDAP  03/30/2014  . INFLUENZA VACCINE  Completed  . DEXA SCAN  Completed  . PNA vac Low Risk Adult  Completed    Breast /Pelvic/DRE per [place] [facility]. Discussed health benefits of physical activity, and encouraged her to engage in regular exercise appropriate for her age and condition.  HTN   ------------------------------------------------------------------------------------------------------------   I have done the exam and reviewed the above chart and it is accurate to the best of my knowledge. Development worker, community has been used in this note in any  air is in the dictation or transcription are unintentional.  Wilhemena Durie, MD  [place]

## 2018-01-24 LAB — CBC WITH DIFFERENTIAL/PLATELET
BASOS: 1 %
Basophils Absolute: 0 10*3/uL (ref 0.0–0.2)
EOS (ABSOLUTE): 0.2 10*3/uL (ref 0.0–0.4)
EOS: 4 %
Hematocrit: 38.3 % (ref 34.0–46.6)
Hemoglobin: 13.1 g/dL (ref 11.1–15.9)
IMMATURE GRANS (ABS): 0 10*3/uL (ref 0.0–0.1)
IMMATURE GRANULOCYTES: 0 %
Lymphocytes Absolute: 1.2 10*3/uL (ref 0.7–3.1)
Lymphs: 22 %
MCH: 30.5 pg (ref 26.6–33.0)
MCHC: 34.2 g/dL (ref 31.5–35.7)
MCV: 89 fL (ref 79–97)
MONOS ABS: 0.3 10*3/uL (ref 0.1–0.9)
Monocytes: 6 %
NEUTROS PCT: 67 %
Neutrophils Absolute: 3.9 10*3/uL (ref 1.4–7.0)
PLATELETS: 218 10*3/uL (ref 150–379)
RBC: 4.3 x10E6/uL (ref 3.77–5.28)
RDW: 13.1 % (ref 12.3–15.4)
WBC: 5.8 10*3/uL (ref 3.4–10.8)

## 2018-01-24 LAB — COMPREHENSIVE METABOLIC PANEL
ALK PHOS: 73 IU/L (ref 39–117)
ALT: 14 IU/L (ref 0–32)
AST: 21 IU/L (ref 0–40)
Albumin/Globulin Ratio: 1.9 (ref 1.2–2.2)
Albumin: 4.5 g/dL (ref 3.5–4.7)
BUN/Creatinine Ratio: 20 (ref 12–28)
BUN: 16 mg/dL (ref 8–27)
Bilirubin Total: 0.8 mg/dL (ref 0.0–1.2)
CALCIUM: 9.7 mg/dL (ref 8.7–10.3)
CO2: 23 mmol/L (ref 20–29)
CREATININE: 0.79 mg/dL (ref 0.57–1.00)
Chloride: 104 mmol/L (ref 96–106)
GFR calc Af Amer: 81 mL/min/{1.73_m2} (ref >59)
GFR, EST NON AFRICAN AMERICAN: 70 mL/min/{1.73_m2} (ref >59)
GLOBULIN, TOTAL: 2.4 g/dL (ref 1.5–4.5)
GLUCOSE: 84 mg/dL (ref 65–99)
Potassium: 4.4 mmol/L (ref 3.5–5.2)
SODIUM: 141 mmol/L (ref 134–144)
Total Protein: 6.9 g/dL (ref 6.0–8.5)

## 2018-01-24 LAB — LIPASE: LIPASE: 31 U/L (ref 14–85)

## 2018-01-24 LAB — LIPID PANEL
CHOLESTEROL TOTAL: 175 mg/dL (ref 100–199)
Chol/HDL Ratio: 2.1 ratio (ref 0.0–4.4)
HDL: 84 mg/dL (ref >39)
LDL CALC: 82 mg/dL (ref 0–99)
Triglycerides: 47 mg/dL (ref 0–149)
VLDL CHOLESTEROL CAL: 9 mg/dL (ref 5–40)

## 2018-01-24 LAB — TSH: TSH: 2.8 u[IU]/mL (ref 0.450–4.500)

## 2018-02-03 ENCOUNTER — Telehealth: Payer: Self-pay | Admitting: Family Medicine

## 2018-02-03 NOTE — Telephone Encounter (Signed)
Pt stated that she saw on the news that losartan (COZAAR) 100 MG tablet was on recall and would like a call back to advise what she needs to do. [facility]. Please advise. Thanks TNP

## 2018-02-03 NOTE — Telephone Encounter (Signed)
Pt returned call. Please advise. Thanks TNP °

## 2018-02-03 NOTE — Telephone Encounter (Signed)
LMTCB ED 

## 2018-02-26 ENCOUNTER — Other Ambulatory Visit: Payer: Self-pay

## 2018-02-26 ENCOUNTER — Emergency Department: Payer: Medicare Other

## 2018-02-26 ENCOUNTER — Inpatient Hospital Stay
Admission: EM | Admit: 2018-02-26 | Discharge: 2018-03-01 | DRG: 390 | Disposition: A | Discharge status: Home / Self Care | Payer: Medicare Other | Attending: Surgery | Admitting: Surgery

## 2018-02-26 DIAGNOSIS — Z882 Allergy status to sulfonamides status: Secondary | ICD-10-CM

## 2018-02-26 DIAGNOSIS — Z8719 Personal history of other diseases of the digestive system: Secondary | ICD-10-CM | POA: Diagnosis not present

## 2018-02-26 DIAGNOSIS — Z888 Allergy status to other drugs, medicaments and biological substances status: Secondary | ICD-10-CM

## 2018-02-26 DIAGNOSIS — M1990 Unspecified osteoarthritis, unspecified site: Secondary | ICD-10-CM | POA: Diagnosis present

## 2018-02-26 DIAGNOSIS — Z7982 Long term (current) use of aspirin: Secondary | ICD-10-CM | POA: Diagnosis not present

## 2018-02-26 DIAGNOSIS — Z8249 Family history of ischemic heart disease and other diseases of the circulatory system: Secondary | ICD-10-CM

## 2018-02-26 DIAGNOSIS — K589 Irritable bowel syndrome without diarrhea: Secondary | ICD-10-CM | POA: Diagnosis present

## 2018-02-26 DIAGNOSIS — K913 Postprocedural intestinal obstruction, unspecified as to partial versus complete: Secondary | ICD-10-CM | POA: Diagnosis present

## 2018-02-26 DIAGNOSIS — R112 Nausea with vomiting, unspecified: Secondary | ICD-10-CM

## 2018-02-26 DIAGNOSIS — Z79899 Other long term (current) drug therapy: Secondary | ICD-10-CM | POA: Diagnosis not present

## 2018-02-26 DIAGNOSIS — K56609 Unspecified intestinal obstruction, unspecified as to partial versus complete obstruction: Secondary | ICD-10-CM | POA: Diagnosis not present

## 2018-02-26 DIAGNOSIS — R109 Unspecified abdominal pain: Secondary | ICD-10-CM | POA: Diagnosis present

## 2018-02-26 DIAGNOSIS — K5652 Intestinal adhesions [bands] with complete obstruction: Secondary | ICD-10-CM | POA: Diagnosis not present

## 2018-02-26 DIAGNOSIS — K219 Gastro-esophageal reflux disease without esophagitis: Secondary | ICD-10-CM | POA: Diagnosis present

## 2018-02-26 DIAGNOSIS — Z880 Allergy status to penicillin: Secondary | ICD-10-CM

## 2018-02-26 DIAGNOSIS — I10 Essential (primary) hypertension: Secondary | ICD-10-CM | POA: Diagnosis present

## 2018-02-26 LAB — COMPREHENSIVE METABOLIC PANEL
ALK PHOS: 66 U/L (ref 38–126)
ALT: 18 U/L (ref 14–54)
AST: 30 U/L (ref 15–41)
Albumin: 4.6 g/dL (ref 3.5–5.0)
Anion gap: 15 (ref 5–15)
BUN: 20 mg/dL (ref 6–20)
CALCIUM: 10.5 mg/dL — AB (ref 8.9–10.3)
CO2: 25 mmol/L (ref 22–32)
CREATININE: 0.86 mg/dL (ref 0.44–1.00)
Chloride: 99 mmol/L — ABNORMAL LOW (ref 101–111)
GFR calc Af Amer: >60 mL/min (ref >60)
GFR calc non Af Amer: >60 mL/min (ref >60)
GLUCOSE: 143 mg/dL — AB (ref 65–99)
Potassium: 4.2 mmol/L (ref 3.5–5.1)
SODIUM: 139 mmol/L (ref 135–145)
Total Bilirubin: 1.5 mg/dL — ABNORMAL HIGH (ref 0.3–1.2)
Total Protein: 8.1 g/dL (ref 6.5–8.1)

## 2018-02-26 LAB — CBC
HCT: 40.5 % (ref 35.0–47.0)
Hemoglobin: 14.5 g/dL (ref 12.0–16.0)
MCH: 30.7 pg (ref 26.0–34.0)
MCHC: 35.7 g/dL (ref 32.0–36.0)
MCV: 85.9 fL (ref 80.0–100.0)
PLATELETS: 237 10*3/uL (ref 150–440)
RBC: 4.71 MIL/uL (ref 3.80–5.20)
RDW: 13 % (ref 11.5–14.5)
WBC: 8.3 10*3/uL (ref 3.6–11.0)

## 2018-02-26 LAB — LIPASE, BLOOD: Lipase: 26 U/L (ref 11–51)

## 2018-02-26 MED ORDER — IOPAMIDOL (ISOVUE-300) INJECTION 61%
100.0000 mL | Freq: Once | INTRAVENOUS | Status: AC | PRN
  Administered 2018-02-26: 100 mL via INTRAVENOUS

## 2018-02-26 MED ORDER — SODIUM CHLORIDE 0.9 % IV BOLUS
500.0000 mL | Freq: Once | INTRAVENOUS | Status: AC
  Administered 2018-02-26: 500 mL via INTRAVENOUS

## 2018-02-26 MED ORDER — PANTOPRAZOLE SODIUM 40 MG IV SOLR
40.0000 mg | INTRAVENOUS | Status: DC
  Administered 2018-02-27 – 2018-02-28 (×2): 40 mg via INTRAVENOUS
  Filled 2018-02-26 (×2): qty 40

## 2018-02-26 MED ORDER — SODIUM CHLORIDE 0.9 % IV SOLN
Freq: Once | INTRAVENOUS | Status: AC
  Administered 2018-02-26: 19:00:00 via INTRAVENOUS

## 2018-02-26 MED ORDER — KCL IN DEXTROSE-NACL 20-5-0.45 MEQ/L-%-% IV SOLN
INTRAVENOUS | Status: DC
  Administered 2018-02-26 – 2018-02-28 (×6): via INTRAVENOUS
  Filled 2018-02-26 (×8): qty 1000

## 2018-02-26 MED ORDER — MORPHINE SULFATE (PF) 2 MG/ML IV SOLN
2.0000 mg | Freq: Once | INTRAVENOUS | Status: AC
Start: 2018-02-26 — End: 2018-02-26
  Administered 2018-02-26: 2 mg via INTRAVENOUS
  Filled 2018-02-26: qty 1

## 2018-02-26 MED ORDER — PROMETHAZINE HCL 25 MG/ML IJ SOLN
12.5000 mg | Freq: Four times a day (QID) | INTRAMUSCULAR | Status: DC | PRN
  Administered 2018-02-26: 12.5 mg via INTRAVENOUS
  Filled 2018-02-26: qty 1

## 2018-02-26 MED ORDER — ENOXAPARIN SODIUM 40 MG/0.4ML ~~LOC~~ SOLN
40.0000 mg | SUBCUTANEOUS | Status: DC
Start: 2018-02-26 — End: 2018-03-01
  Administered 2018-02-26 – 2018-02-28 (×3): 40 mg via SUBCUTANEOUS
  Filled 2018-02-26 (×3): qty 0.4

## 2018-02-26 NOTE — ED Provider Notes (Signed)
[facility] Emergency Department Provider Note    First MD Initiated Contact with Patient 02/26/18 1510     (approximate)  I have reviewed the triage vital signs and the nursing notes.   HISTORY  Chief Complaint Abdominal Pain    HPI Bianca Malone is a 83 y.o. female with a history of recurrent abdominal pain she describes his abdominal spasms she has been seeing GI for and is prescribed Bentyl and Phenergan presents with less than 24 hours of persistent pain nausea and vomiting similar to previous episodes.  Denies any blood in her emesis.  No melena or hematochezia.  No fevers.  Denies any chest pain.  Came to the ER because she was unable to tolerate any oral hydration or medications.  Describes pain is mild to moderate at this time and brief in nature.  Past Medical History:  Diagnosis Date  . Arthritis   . GERD (gastroesophageal reflux disease)   . Hypertension   . Vaginal prolapse    Family History  Problem Relation Age of Onset  . Alzheimer's disease Mother   . Hypertension Mother   . Kidney failure Mother   . Hypertension Father   . CAD Father   . Heart disease Brother   . Anemia Brother   . Heart disease Brother   . Myelodysplastic syndrome Brother   . Breast cancer Paternal Aunt   . Prostate cancer Paternal Uncle    Past Surgical History:  Procedure Laterality Date  . ABDOMINAL ADHESION SURGERY     small bowel lysis of adhesions  . BREAST CYST ASPIRATION Left   . CARPAL TUNNEL RELEASE    . COLONOSCOPY    . COLONOSCOPY WITH PROPOFOL N/A 07/22/2017   Procedure: COLONOSCOPY WITH PROPOFOL;  Surgeon: Manya Silvas, MD;  Location: [facility] ENDOSCOPY;  Service: Endoscopy;  Laterality: N/A;  . CYST EXCISION     thyroglossal duct cyst surgery  . ESOPHAGOGASTRODUODENOSCOPY (EGD) WITH PROPOFOL N/A 07/22/2017   Procedure: ESOPHAGOGASTRODUODENOSCOPY (EGD) WITH PROPOFOL;  Surgeon: Manya Silvas, MD;  Location: [facility] ENDOSCOPY;  Service:  Endoscopy;  Laterality: N/A;  . HYSTEROSCOPY    . INCISION AND DRAINAGE / EXCISION THYROGLOSSAL CYST    . UPPER GASTROINTESTINAL ENDOSCOPY     Patient Active Problem List   Diagnosis Date Noted  . Small bowel obstruction due to postoperative adhesions 02/26/2018  . Back pain with sciatica 05/13/2017  . Allergic rhinitis 08/19/2015  . Anxiety 08/19/2015  . Intestinal obstruction ([place]) 08/19/2015  . Arthritis 08/19/2015  . Osteoarthrosis 08/19/2015  . Gastro-esophageal reflux disease without esophagitis 08/19/2015  . 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 BP (high blood pressure) 08/19/2015  . Mitral valve disorder 08/19/2015  . Post menopausal syndrome 08/19/2015  . Thyroid nodule 08/19/2015      Prior to Admission medications   Medication Sig Start Date End Date Taking? Authorizing Provider  aspirin 81 MG chewable tablet Chew by mouth.    Yes [provider]  CALCIUM CARBONATE-VITAMIN D PO Take 1 tablet by mouth daily. Reported on 03/08/2016   Yes [provider]  losartan (COZAAR) 100 MG tablet Take 1 tablet (100 mg total) by mouth daily. 12/30/17  Yes Jerrol Banana., MD  pantoprazole (PROTONIX) 40 MG tablet TAKE 1 TABLET (40 MG TOTAL) BY MOUTH DAILY. 04/01/17  Yes Jerrol Banana., MD  dicyclomine (BENTYL) 20 MG tablet Take 20 mg by mouth every 6 (six) hours as needed for spasms.  [provider]  ibuprofen (ADVIL,MOTRIN) 600 MG tablet Take 1 tablet (600 mg total) by mouth every 6 (six) hours as needed. 01/02/16   Jerrol Banana., MD  polyethylene glycol [facility] / Floria Raveling) packet Take 17 g by mouth daily as needed.    [provider]  promethazine (PHENERGAN) 25 MG tablet Take 25 mg by mouth every 6 (six) hours as needed for nausea or vomiting.    [provider]    Allergies Loratadine; Amoxicillin; Daypro  [oxaprozin]; and Sulfa antibiotics    Social History Social History    Tobacco Use  . Smoking status: Never Smoker  . Smokeless tobacco: Never Used  Substance Use Topics  . Alcohol use: Yes    Alcohol/week: 0.6 - 1.2 oz    Types: 1 - 2 Glasses of wine per week  . Drug use: No    Review of Systems Patient denies headaches, rhinorrhea, blurry vision, numbness, shortness of breath, chest pain, edema, cough, abdominal pain, nausea, vomiting, diarrhea, dysuria, fevers, rashes or hallucinations unless otherwise stated above in HPI. ____________________________________________   PHYSICAL EXAM:  VITAL SIGNS: Vitals:   02/26/18 1555 02/26/18 2033  BP: (!) 161/59 (!) 178/71  Pulse: 72 92  Resp:    Temp:    SpO2: 99% 98%    Constitutional: Alert and oriented. Well appearing and in no acute distress. Eyes: Conjunctivae are normal.  Head: Atraumatic. Nose: No congestion/rhinnorhea. Mouth/Throat: Mucous membranes are moist.   Neck: No stridor. Painless ROM.  Cardiovascular: Normal rate, regular rhythm. Grossly normal heart sounds.  Good peripheral circulation. Respiratory: Normal respiratory effort.  No retractions. Lungs CTAB. Gastrointestinal: Soft and nontender. No distention. No abdominal bruits. No CVA tenderness. Genitourinary:  Musculoskeletal: No lower extremity tenderness nor edema.  No joint effusions. Neurologic:  Normal speech and language. No gross focal neurologic deficits are appreciated. No facial droop Skin:  Skin is warm, dry and intact. No rash noted. Psychiatric: Mood and affect are normal. Speech and behavior are normal.  ____________________________________________   LABS (all labs ordered are listed, but only abnormal results are displayed)  Results for orders placed or performed during the hospital encounter of 02/26/18 (from the past 24 hour(s))  Lipase, blood     Status: None   Collection Time: 02/26/18  1:40 PM  Result Value Ref Range   Lipase 26 11 - 51 U/L  Comprehensive metabolic panel     Status: Abnormal    Collection Time: 02/26/18  1:40 PM  Result Value Ref Range   Sodium 139 135 - 145 mmol/L   Potassium 4.2 3.5 - 5.1 mmol/L   Chloride 99 (L) 101 - 111 mmol/L   CO2 25 22 - 32 mmol/L   Glucose, Bld 143 (H) 65 - 99 mg/dL   BUN 20 6 - 20 mg/dL   Creatinine, Ser 0.86 0.44 - 1.00 mg/dL   Calcium 10.5 (H) 8.9 - 10.3 mg/dL   Total Protein 8.1 6.5 - 8.1 g/dL   Albumin 4.6 3.5 - 5.0 g/dL   AST 30 15 - 41 U/L   ALT 18 14 - 54 U/L   Alkaline Phosphatase 66 38 - 126 U/L   Total Bilirubin 1.5 (H) 0.3 - 1.2 mg/dL   GFR calc non Af Amer >60 >60 mL/min   GFR calc Af Amer >60 >60 mL/min   Anion gap 15 5 - 15  CBC     Status: None   Collection Time: 02/26/18  1:40 PM  Result Value Ref  Range   WBC 8.3 3.6 - 11.0 K/uL   RBC 4.71 3.80 - 5.20 MIL/uL   Hemoglobin 14.5 12.0 - 16.0 g/dL   HCT 40.5 35.0 - 47.0 %   MCV 85.9 80.0 - 100.0 fL   MCH 30.7 26.0 - 34.0 pg   MCHC 35.7 32.0 - 36.0 g/dL   RDW 13.0 11.5 - 14.5 %   Platelets 237 150 - 440 K/uL   ____________________________________________  _________________________________  RADIOLOGY  I personally reviewed all radiographic images ordered to evaluate for the above acute complaints and reviewed radiology reports and findings.  These findings were personally discussed with the patient.  Please see medical record for radiology report.  ____________________________________________   PROCEDURES  Procedure(s) performed:  Procedures    Critical Care performed: no ____________________________________________   INITIAL IMPRESSION / ASSESSMENT AND PLAN / ED COURSE  Pertinent labs & imaging results that were available during my care of the patient were reviewed by me and considered in my medical decision making (see chart for details).  DDX: sbo, enteritis, gastritis, ibs, ib  LEATRICE PARILLA is a 83 y.o. who presents to the ED with abdominal pain and symptoms as described above.  Blood work is reassuring.  KUB ordered which shows no evidence  of obstruction but on repeat abdominal examination the patient was persistently tender and does have a mildly distended abdomen.  Based on her tenderness CT imaging ordered which shows evidence of a small bowel obstruction with transition point.  NG tube placed for about the compression.  We will continue with IV fluids as well as IV pain medication IV antiemetics.  Patient will require admission to the hospital.  Spoke with Dr. Rosana Hoes of general surgery who kindly agrees to admit.  Have discussed with the patient and available family all diagnostics and treatments performed thus far and all questions were answered to the best of my ability. The patient demonstrates understanding and agreement with plan.       As part of my medical decision making, I reviewed the following data within the [place] notes reviewed and incorporated, Labs reviewed, notes from prior ED visits.  ____________________________________________   FINAL CLINICAL IMPRESSION(S) / ED DIAGNOSES  Final diagnoses:  Nausea and vomiting  Small bowel obstruction ([place])      NEW MEDICATIONS STARTED DURING THIS VISIT:  Current Discharge Medication List       Note:  This document was prepared using Dragon voice recognition software and may include unintentional dictation errors.    Merlyn Lot, MD 02/26/18 [phone]

## 2018-02-26 NOTE — ED Triage Notes (Signed)
Pt c/o abd pain with nausea - started last pm - vomited 8-10 times in 24 hours - 3 stools in 24 hours -

## 2018-02-26 NOTE — Progress Notes (Signed)
Called Dr. Rosana Hoes regarding pain medication per patient request.  Appropriate orders were placed.  Christene Slates  02/26/2018  10:01 PM

## 2018-02-26 NOTE — H&P (Signed)
SURGICAL HISTORY & PHYSICAL (cpt [id number])  HISTORY OF PRESENT ILLNESS (HPI):  83 y.o. female presented to [facility] ED today for abdominal pain. Patient reports she developed crampy abdominal pain with nausea around 10 pm last night and non-bloody emesis overnight. She describes her pain has improved since insertion of NG tube, and she says her abdomen feels softer than overnight when she says it was distended and firm. Overnight, she passed a small soft BM with no flatus since, and her last normal BM was 2.5 days ago (Monday). She says she usually has a BM every 1 - 2 days with once daily Miralax x past 4 - 5 years. Prior SBO (2013) resolved with NG tube, but her first SBO (2001) required laparotomy with lysis of a single adhesive band without bowel resection. Of note, patient diagnosed with irritable bowel syndrome due to intermittent abdominal "spasms" x 20 years, though reports her bowel was described by Dr. Vira Agar as "gnarly" during colonoscopy, and her daughter is currently being worked up for Crohn's disease. Denies fever/chills, CP, or SOB.   PAST MEDICAL HISTORY ([facility]):      Past Medical History:  Diagnosis Date  . Arthritis   . GERD (gastroesophageal reflux disease)   . Hypertension   . Vaginal prolapse     Reviewed. Otherwise negative.   PAST SURGICAL HISTORY ([place]):       Past Surgical History:  Procedure Laterality Date  . ABDOMINAL ADHESION SURGERY     small bowel lysis of adhesions  . BREAST CYST ASPIRATION Left   . CARPAL TUNNEL RELEASE    . COLONOSCOPY    . COLONOSCOPY WITH PROPOFOL N/A 07/22/2017   Procedure: COLONOSCOPY WITH PROPOFOL;  Surgeon: Manya Silvas, MD;  Location: [facility] ENDOSCOPY;  Service: Endoscopy;  Laterality: N/A;  . CYST EXCISION     thyroglossal duct cyst surgery  . ESOPHAGOGASTRODUODENOSCOPY (EGD) WITH PROPOFOL N/A 07/22/2017   Procedure: ESOPHAGOGASTRODUODENOSCOPY (EGD) WITH PROPOFOL;  Surgeon: Manya Silvas, MD;  Location: [facility]  ENDOSCOPY;  Service: Endoscopy;  Laterality: N/A;  . HYSTEROSCOPY    . INCISION AND DRAINAGE / EXCISION THYROGLOSSAL CYST    . UPPER GASTROINTESTINAL ENDOSCOPY      Reviewed. Otherwise negative.   MEDICATIONS:         Prior to Admission medications   Medication Sig Start Date End Date Taking? Authorizing Provider  aspirin 81 MG chewable tablet Chew by mouth.    Yes [provider]  CALCIUM CARBONATE-VITAMIN D PO Take 1 tablet by mouth daily. Reported on 03/08/2016   Yes [provider]  losartan (COZAAR) 100 MG tablet Take 1 tablet (100 mg total) by mouth daily. 12/30/17  Yes Jerrol Banana., MD  pantoprazole (PROTONIX) 40 MG tablet TAKE 1 TABLET (40 MG TOTAL) BY MOUTH DAILY. 04/01/17  Yes Jerrol Banana., MD  dicyclomine (BENTYL) 20 MG tablet Take 20 mg by mouth every 6 (six) hours as needed for spasms.    [provider]  ibuprofen (ADVIL,MOTRIN) 600 MG tablet Take 1 tablet (600 mg total) by mouth every 6 (six) hours as needed. 01/02/16   Jerrol Banana., MD  polyethylene glycol [facility] / Floria Raveling) packet Take 17 g by mouth daily as needed.    [provider]  promethazine (PHENERGAN) 25 MG tablet Take 25 mg by mouth every 6 (six) hours as needed for nausea or vomiting.    [provider]     ALLERGIES:       Allergies  Allergen Reactions  . Loratadine   . Amoxicillin Hives and Rash       . Daypro  [Oxaprozin] Rash  . Sulfa Antibiotics Rash    presuptive reaction     SOCIAL HISTORY:  Social History        Socioeconomic History  . Marital status: Married    Spouse name: Not on file  . Number of children: Not on file  . Years of education: Not on file  . Highest education level: Not on file  Occupational History  . Not on file  Social Needs  . Financial resource strain: Not on file  . Food insecurity:    Worry: Not on file    Inability: Not on file  . Transportation  needs:    Medical: Not on file    Non-medical: Not on file  Tobacco Use  . Smoking status: Never Smoker  . Smokeless tobacco: Never Used  Substance and Sexual Activity  . Alcohol use: Yes    Alcohol/week: 0.6 - 1.2 oz    Types: 1 - 2 Glasses of wine per week  . Drug use: No  . Sexual activity: Yes    Birth control/protection: Condom  Lifestyle  . Physical activity:    Days per week: Not on file    Minutes per session: Not on file  . Stress: Not on file  Relationships  . Social connections:    Talks on phone: Not on file    Gets together: Not on file    Attends religious service: Not on file    Active member of club or organization: Not on file    Attends meetings of clubs or organizations: Not on file    Relationship status: Not on file  . Intimate partner violence:    Fear of current or ex partner: Not on file    Emotionally abused: Not on file    Physically abused: Not on file    Forced sexual activity: Not on file  Other Topics Concern  . Not on file  Social History Narrative  . Not on file    The patient currently resides (home / rehab facility / nursing home): Home The patient normally is (ambulatory / bedbound): Ambulatory  FAMILY HISTORY:       Family History  Problem Relation Age of Onset  . Alzheimer's disease Mother   . Hypertension Mother   . Kidney failure Mother   . Hypertension Father   . CAD Father   . Heart disease Brother   . Anemia Brother   . Heart disease Brother   . Myelodysplastic syndrome Brother   . Breast cancer Paternal Aunt   . Prostate cancer Paternal Uncle     Otherwise negative.   REVIEW OF SYSTEMS:  Constitutional: denies any other weight loss, fever, chills, or sweats  Eyes: denies any other vision changes, history of eye injury  ENT: denies sore throat, hearing problems  Respiratory: denies shortness of breath, wheezing  Cardiovascular: denies chest pain, palpitations   Gastrointestinal: abdominal pain, N/V, and bowel function as per HPI  Genitourinary: denies burning with urination or urinary frequency Musculoskeletal: denies any other joint pains or cramps  Skin: Denies any other rashes or skin discolorations  Neurological: denies any other headache, dizziness, weakness  Psychiatric: denies any other depression, anxiety   All other review of systems were otherwise negative.  VITAL SIGNS:  Temp:  [98.2 F (36.8 C)] 98.2 F (36.8 C) (03/27 1336) Pulse Rate:  [72-97] 72 (03/27  1555) Resp:  [16] 16 (03/27 1336) BP: (134-161)/(59-60) 161/59 (03/27 1555) SpO2:  [97 %-99 %] 99 % (03/27 1555) Weight:  [132 lb (59.9 kg)] 132 lb (59.9 kg) (03/27 1337)     Height: 5\' 5"  (165.1 cm) Weight: 132 lb (59.9 kg) BMI (Calculated): 21.97   INTAKE/OUTPUT:  This shift: No intake/output data recorded.  Last 2 shifts: @IOLAST2SHIFTS @  PHYSICAL EXAM:  Constitutional:  -- Normal body habitus  -- Awake, alert, and oriented x3, no apparent distress Eyes:  -- Pupils equally round and reactive to light  -- No scleral icterus, B/L no occular discharge Ear, nose, throat: -- Neck is FROM WNL -- No jugular venous distension  Pulmonary:  -- No wheezes or rhales -- Equal breath sounds bilaterally -- Breathing non-labored at rest Cardiovascular:  -- S1, S2 present  -- No pericardial rubs  Gastrointestinal:  -- Abdomen soft and mildly distended with mild diffuse abdominal tenderness to palpation, no guarding or rebound tenderness -- No abdominal masses appreciated, pulsatile or otherwise  Musculoskeletal and Integumentary:  -- Wounds or skin discoloration: None appreciated -- Extremities: B/L UE and LE FROM, hands and feet warm, no edema  Neurologic:  -- Motor function: Intact and symmetric -- Sensation: Intact and symmetric Psychiatric:  -- Mood and affect WNL  Labs:  CBC Latest Ref Rng & Units 02/26/2018 01/23/2018 07/17/2017  WBC 3.6 - 11.0 K/uL 8.3 5.8 5.4   Hemoglobin 12.0 - 16.0 g/dL 14.5 13.1 12.7  Hematocrit 35.0 - 47.0 % 40.5 38.3 38.1  Platelets 150 - 440 K/uL 237 218 215   CMP Latest Ref Rng & Units 02/26/2018 01/23/2018 07/17/2017  Glucose 65 - 99 mg/dL 143(H) 84 111(H)  BUN 6 - 20 mg/dL 20 16 15   Creatinine 0.44 - 1.00 mg/dL 0.86 0.79 0.85  Sodium 135 - 145 mmol/L 139 141 138  Potassium 3.5 - 5.1 mmol/L 4.2 4.4 4.3  Chloride 101 - 111 mmol/L 99(L) 104 99  CO2 22 - 32 mmol/L 25 23 25   Calcium 8.9 - 10.3 mg/dL 10.5(H) 9.7 10.3  Total Protein 6.5 - 8.1 g/dL 8.1 6.9 6.8  Total Bilirubin 0.3 - 1.2 mg/dL 1.5(H) 0.8 1.2  Alkaline Phos 38 - 126 U/L 66 73 69  AST 15 - 41 U/L 30 21 24   ALT 14 - 54 U/L 18 14 11    Imaging studies:  CT Abdomen and Pelvis with Contrast (02/26/2018) - personally reviewed and discussed with patient and her husband 1. Recurrent small bowel obstruction with transition point RIGHT mid abdomen. 2. Small volume ascites without abscess.   Assessment/Plan: (ICD-10's: K29.52) 83 y.o. femalewith recurrent complete small bowel obstruction, likely attributable to post-surgical adhesions following prior laparotomy with lysis of adhesions, complicated by pertinent comorbidities including advanced chronological age, HTN, small hiatal hernia with GERD, osteoarthritis, and vaginal prolapse.  - NPO for now, IV fluids - NG tube for nasogastric decompression - monitor ongoing bowel function and abdominal exam  - anticipate symptomatic relief within 24 - 48 hours following NGT insertion, followed by "rumbling" the following day and flatus either the same day or the day following the "rumbling" with anticipated length of stay ~3 - 5 days with successful non-operative management for 8 of 10 patients with small bowel obstruction attributed to post-surgical adhesions             - surgical intervention if doesn't improve was also discussed - medical management  comorbidities - ambulation encouraged - DVT prophylaxis  All of the above findings and  recommendations were discussed with the patient and her husband, and all of her and family's questions were answered to their expressed satisfaction.  -- Marilynne Drivers Rosana Hoes, MD, [place]: [facility] - Partnering for exceptional care. Office: [phone]

## 2018-02-26 NOTE — ED Notes (Signed)
Updated patient on plan of care and delays.  Will continue to monitor.

## 2018-02-26 NOTE — ED Notes (Signed)
Pt given mouth swab for comfort

## 2018-02-26 NOTE — ED Notes (Signed)
[facility] RN aware of bed assigned

## 2018-02-26 NOTE — ED Notes (Signed)
Two unsuccessful PIV attempts per this RN. 

## 2018-02-26 NOTE — Consult Note (Signed)
SURGICAL HISTORY & PHYSICAL (cpt [phone])  HISTORY OF PRESENT ILLNESS (HPI):  83 y.o. female presented to [facility] ED today for abdominal pain. Patient reports she developed crampy abdominal pain with nausea around 10 pm last night and non-bloody emesis overnight. She describes her pain has improved since insertion of NG tube, and she says her abdomen feels softer than overnight when she says it was distended and firm. Overnight, she passed a small soft BM with no flatus since, and her last normal BM was 2.5 days ago (Monday). She says she usually has a BM every 1 - 2 days with once daily Miralax x past 4 - 5 years. Prior SBO (2013) resolved with NG tube, but her first SBO (2001) required laparotomy with lysis of a single adhesive band without bowel resection. Of note, patient diagnosed with irritable bowel syndrome due to intermittent abdominal "spasms" x 20 years, though reports her bowel was described by Dr. Vira Agar as "gnarly" during colonoscopy, and her daughter is currently being worked up for Crohn's disease. Denies fever/chills, CP, or SOB.   PAST MEDICAL HISTORY (PMH):  Past Medical History:  Diagnosis Date  . Arthritis   . GERD (gastroesophageal reflux disease)   . Hypertension   . Vaginal prolapse     Reviewed. Otherwise negative.   PAST SURGICAL HISTORY ([place]):  Past Surgical History:  Procedure Laterality Date  . ABDOMINAL ADHESION SURGERY     small bowel lysis of adhesions  . BREAST CYST ASPIRATION Left   . CARPAL TUNNEL RELEASE    . COLONOSCOPY    . COLONOSCOPY WITH PROPOFOL N/A 07/22/2017   Procedure: COLONOSCOPY WITH PROPOFOL;  Surgeon: Manya Silvas, MD;  Location: [facility] ENDOSCOPY;  Service: Endoscopy;  Laterality: N/A;  . CYST EXCISION     thyroglossal duct cyst surgery  . ESOPHAGOGASTRODUODENOSCOPY (EGD) WITH PROPOFOL N/A 07/22/2017   Procedure: ESOPHAGOGASTRODUODENOSCOPY (EGD) WITH PROPOFOL;  Surgeon: Manya Silvas, MD;  Location: [facility] ENDOSCOPY;  Service:  Endoscopy;  Laterality: N/A;  . HYSTEROSCOPY    . INCISION AND DRAINAGE / EXCISION THYROGLOSSAL CYST    . UPPER GASTROINTESTINAL ENDOSCOPY      Reviewed. Otherwise negative.   MEDICATIONS:  Prior to Admission medications   Medication Sig Start Date End Date Taking? Authorizing Provider  aspirin 81 MG chewable tablet Chew by mouth.    Yes [provider]  CALCIUM CARBONATE-VITAMIN D PO Take 1 tablet by mouth daily. Reported on 03/08/2016   Yes [provider]  losartan (COZAAR) 100 MG tablet Take 1 tablet (100 mg total) by mouth daily. 12/30/17  Yes Jerrol Banana., MD  pantoprazole (PROTONIX) 40 MG tablet TAKE 1 TABLET (40 MG TOTAL) BY MOUTH DAILY. 04/01/17  Yes Jerrol Banana., MD  dicyclomine (BENTYL) 20 MG tablet Take 20 mg by mouth every 6 (six) hours as needed for spasms.    [provider]  ibuprofen (ADVIL,MOTRIN) 600 MG tablet Take 1 tablet (600 mg total) by mouth every 6 (six) hours as needed. 01/02/16   Jerrol Banana., MD  polyethylene glycol [facility] / Floria Raveling) packet Take 17 g by mouth daily as needed.    [provider]  promethazine (PHENERGAN) 25 MG tablet Take 25 mg by mouth every 6 (six) hours as needed for nausea or vomiting.    [provider]     ALLERGIES:  Allergies  Allergen Reactions  . Loratadine   . Amoxicillin Hives and Rash       . Daypro  [  Oxaprozin] Rash  . Sulfa Antibiotics Rash    presuptive reaction     SOCIAL HISTORY:  Social History   Socioeconomic History  . Marital status: Married    Spouse name: Not on file  . Number of children: Not on file  . Years of education: Not on file  . Highest education level: Not on file  Occupational History  . Not on file  Social Needs  . Financial resource strain: Not on file  . Food insecurity:    Worry: Not on file    Inability: Not on file  . Transportation needs:    Medical: Not on file    Non-medical: Not on file  Tobacco Use  .  Smoking status: Never Smoker  . Smokeless tobacco: Never Used  Substance and Sexual Activity  . Alcohol use: Yes    Alcohol/week: 0.6 - 1.2 oz    Types: 1 - 2 Glasses of wine per week  . Drug use: No  . Sexual activity: Yes    Birth control/protection: Condom  Lifestyle  . Physical activity:    Days per week: Not on file    Minutes per session: Not on file  . Stress: Not on file  Relationships  . Social connections:    Talks on phone: Not on file    Gets together: Not on file    Attends religious service: Not on file    Active member of club or organization: Not on file    Attends meetings of clubs or organizations: Not on file    Relationship status: Not on file  . Intimate partner violence:    Fear of current or ex partner: Not on file    Emotionally abused: Not on file    Physically abused: Not on file    Forced sexual activity: Not on file  Other Topics Concern  . Not on file  Social History Narrative  . Not on file    The patient currently resides (home / rehab facility / nursing home): Home The patient normally is (ambulatory / bedbound): Ambulatory  FAMILY HISTORY:  Family History  Problem Relation Age of Onset  . Alzheimer's disease Mother   . Hypertension Mother   . Kidney failure Mother   . Hypertension Father   . CAD Father   . Heart disease Brother   . Anemia Brother   . Heart disease Brother   . Myelodysplastic syndrome Brother   . Breast cancer Paternal Aunt   . Prostate cancer Paternal Uncle     Otherwise negative.   REVIEW OF SYSTEMS:  Constitutional: denies any other weight loss, fever, chills, or sweats  Eyes: denies any other vision changes, history of eye injury  ENT: denies sore throat, hearing problems  Respiratory: denies shortness of breath, wheezing  Cardiovascular: denies chest pain, palpitations  Gastrointestinal: abdominal pain, N/V, and bowel function as per HPI  Genitourinary: denies burning with urination or urinary  frequency Musculoskeletal: denies any other joint pains or cramps  Skin: Denies any other rashes or skin discolorations  Neurological: denies any other headache, dizziness, weakness  Psychiatric: denies any other depression, anxiety   All other review of systems were otherwise negative.  VITAL SIGNS:  Temp:  [98.2 F (36.8 C)] 98.2 F (36.8 C) (03/27 1336) Pulse Rate:  [72-97] 72 (03/27 1555) Resp:  [16] 16 (03/27 1336) BP: (134-161)/(59-60) 161/59 (03/27 1555) SpO2:  [97 %-99 %] 99 % (03/27 1555) Weight:  [132 lb (59.9 kg)] 132 lb (59.9 kg) (  03/27 1337)     Height: 5\' 5"  (165.1 cm) Weight: 132 lb (59.9 kg) BMI (Calculated): 21.97   INTAKE/OUTPUT:  This shift: No intake/output data recorded.  Last 2 shifts: @IOLAST2SHIFTS @  PHYSICAL EXAM:  Constitutional:  -- Normal body habitus  -- Awake, alert, and oriented x3, no apparent distress Eyes:  -- Pupils equally round and reactive to light  -- No scleral icterus, B/L no occular discharge Ear, nose, throat: -- Neck is FROM WNL -- No jugular venous distension  Pulmonary:  -- No wheezes or rhales -- Equal breath sounds bilaterally -- Breathing non-labored at rest Cardiovascular:  -- S1, S2 present  -- No pericardial rubs  Gastrointestinal:  -- Abdomen soft and mildly distended with mild diffuse abdominal tenderness to palpation, no guarding or rebound tenderness -- No abdominal masses appreciated, pulsatile or otherwise  Musculoskeletal and Integumentary:  -- Wounds or skin discoloration: None appreciated -- Extremities: B/L UE and LE FROM, hands and feet warm, no edema  Neurologic:  -- Motor function: Intact and symmetric -- Sensation: Intact and symmetric Psychiatric:  -- Mood and affect WNL  Labs:  CBC Latest Ref Rng & Units 02/26/2018 01/23/2018 07/17/2017  WBC 3.6 - 11.0 K/uL 8.3 5.8 5.4  Hemoglobin 12.0 - 16.0 g/dL 14.5 13.1 12.7  Hematocrit 35.0 - 47.0 % 40.5 38.3 38.1  Platelets 150 - 440 K/uL 237 218 215   CMP  Latest Ref Rng & Units 02/26/2018 01/23/2018 07/17/2017  Glucose 65 - 99 mg/dL 143(H) 84 111(H)  BUN 6 - 20 mg/dL 20 16 15   Creatinine 0.44 - 1.00 mg/dL 0.86 0.79 0.85  Sodium 135 - 145 mmol/L 139 141 138  Potassium 3.5 - 5.1 mmol/L 4.2 4.4 4.3  Chloride 101 - 111 mmol/L 99(L) 104 99  CO2 22 - 32 mmol/L 25 23 25   Calcium 8.9 - 10.3 mg/dL 10.5(H) 9.7 10.3  Total Protein 6.5 - 8.1 g/dL 8.1 6.9 6.8  Total Bilirubin 0.3 - 1.2 mg/dL 1.5(H) 0.8 1.2  Alkaline Phos 38 - 126 U/L 66 73 69  AST 15 - 41 U/L 30 21 24   ALT 14 - 54 U/L 18 14 11    Imaging studies:  CT Abdomen and Pelvis with Contrast (02/26/2018) - personally reviewed and discussed with patient and her husband 1. Recurrent small bowel obstruction with transition point RIGHT mid abdomen. 2. Small volume ascites without abscess.   Assessment/Plan: (ICD-10's: K75.52) 83 y.o. female with recurrent complete small bowel obstruction, likely attributable to post-surgical adhesions following prior laparotomy with lysis of adhesions, complicated by pertinent comorbidities including advanced chronological age, HTN, small hiatal hernia with GERD, osteoarthritis, and vaginal prolapse.  - NPO for now, IV fluids             - NG tube for nasogastric decompression             - monitor ongoing bowel function and abdominal exam              - anticipate symptomatic relief within 24 - 48 hours following NGT insertion, followed by "rumbling" the following day and flatus either the same day or the day following the "rumbling" with anticipated length of stay ~3 - 5 days with successful non-operative management for 8 of 10 patients with small bowel obstruction attributed to post-surgical adhesions  - surgical intervention if doesn't improve was also discussed             - medical management comorbidities             -  ambulation encouraged              - DVT prophylaxis  All of the above findings and recommendations were discussed with the  patient and her husband, and all of her and family's questions were answered to their expressed satisfaction.  -- Marilynne Drivers Rosana Hoes, MD, [place]: [facility] - Partnering for exceptional care. Office: [phone]

## 2018-02-26 NOTE — ED Notes (Signed)
Patient transported to CT 

## 2018-02-27 LAB — URINALYSIS, COMPLETE (UACMP) WITH MICROSCOPIC
BILIRUBIN URINE: NEGATIVE
Glucose, UA: NEGATIVE mg/dL
Hgb urine dipstick: NEGATIVE
KETONES UR: 5 mg/dL — AB
NITRITE: NEGATIVE
PH: 5 (ref 5.0–8.0)
Protein, ur: NEGATIVE mg/dL
Specific Gravity, Urine: 1.039 — ABNORMAL HIGH (ref 1.005–1.030)

## 2018-02-27 LAB — BASIC METABOLIC PANEL
ANION GAP: 9 (ref 5–15)
BUN: 18 mg/dL (ref 6–20)
CALCIUM: 9 mg/dL (ref 8.9–10.3)
CO2: 23 mmol/L (ref 22–32)
Chloride: 106 mmol/L (ref 101–111)
Creatinine, Ser: 0.71 mg/dL (ref 0.44–1.00)
GFR calc Af Amer: >60 mL/min (ref >60)
GFR calc non Af Amer: >60 mL/min (ref >60)
GLUCOSE: 113 mg/dL — AB (ref 65–99)
POTASSIUM: 3.6 mmol/L (ref 3.5–5.1)
Sodium: 138 mmol/L (ref 135–145)

## 2018-02-27 MED ORDER — LOSARTAN POTASSIUM 50 MG PO TABS
100.0000 mg | ORAL_TABLET | Freq: Every day | ORAL | Status: DC
  Administered 2018-02-27 – 2018-03-01 (×3): 100 mg via ORAL
  Filled 2018-02-27 (×3): qty 2

## 2018-02-27 NOTE — Care Management (Signed)
Admitted from home with small bowel obstruction. NPO, nasogastric tube, IVFs

## 2018-02-27 NOTE — Progress Notes (Signed)
HPI: The patient is an 83 yo female who was admitted with recurrent small bowel obstruction likely secondary to post-surgical adhesions. Clinical status has improved with NGT.  SUBJECTIVE: The patient states that she feels much better today. She notes that her abdomen feels less distended with only slight discomfort in the lower abdomen. She denies flatus and BM. She denies nausea and vomiting. She denies fever and chills. She denies shortness of breath and chest pain.   OBJECTIVE: Vitals:   02/27/18 0523 02/27/18 1034  BP: (!) 151/65 (!) 180/72  Pulse: 81 89  Resp: 20   Temp: 98.1 F (36.7 C)   SpO2: 97%    Physical Exam  Constitutional: She is oriented to person, place, and time. She appears well-developed and well-nourished. No distress.  HENT:  Head: Normocephalic and atraumatic.  Eyes: EOM are normal. No scleral icterus.  Neck: Normal range of motion. Neck supple. No tracheal deviation present.  Cardiovascular: Normal rate and regular rhythm. Exam reveals no gallop and no friction rub.  No murmur heard. Respiratory: Effort normal and breath sounds normal. No respiratory distress. She has no wheezes. She has no rales.  GI: Soft. Bowel sounds are normal. She exhibits no distension and no mass. There is tenderness (mild tenderness in lower abdomen). There is no rebound and no guarding.  Musculoskeletal: Normal range of motion. She exhibits no edema or tenderness (no calf tenderness ).  Neurological: She is alert and oriented to person, place, and time.  Skin: Skin is warm and dry.  Psychiatric: She has a normal mood and affect.   ASSESSMENT/PLAN: The patient is an 83 yo female admitted with recurrent SBO likely secondary to postsurgical adhesions. She is improving clinically with NGT.  1. Small bowel obstruction  - Continue NGT to suction  - Continue NPO except medications  - Continue IVF hydration  - Await return of bowel function  2. Hypertension  - Continue home med  (Losartan)  3. DVT prophylaxis  - Lovenox  - SCDs  - Encourage ambulation

## 2018-02-28 ENCOUNTER — Inpatient Hospital Stay: Payer: Medicare Other

## 2018-02-28 DIAGNOSIS — K56609 Unspecified intestinal obstruction, unspecified as to partial versus complete obstruction: Secondary | ICD-10-CM

## 2018-02-28 NOTE — Progress Notes (Signed)
NGT removed per order. Pt is having no complaints of nausea or abdominal pain at this time. Will continue to monitor pt.   Derril Franek [employer]

## 2018-02-28 NOTE — Progress Notes (Signed)
CC: sbo Subjective: This patient's chart and films of all been personally reviewed.  Patient states that she is having no abdominal pain no nausea or vomiting and is passing gas.  She passed gas this morning.  She feels much better than when she came in.  Objective: Vital signs in last 24 hours: Temp:  [97.9 F (36.6 C)-99.4 F (37.4 C)] 99.4 F (37.4 C) (03/29 0458) Pulse Rate:  [77-92] 77 (03/29 0458) Resp:  [18-19] 18 (03/29 0458) BP: (135-180)/(64-81) 135/64 (03/29 0458) SpO2:  [97 %-100 %] 99 % (03/29 0458) Last BM Date: 02/26/18  Intake/Output from previous day: 03/28 0701 - 03/29 0700 In: 1000 [I.V.:1000] Out: 680 [Urine:500; Emesis/NG output:180] Intake/Output this shift: No intake/output data recorded.  Physical exam:  Vital signs reviewed and stable abdomen is soft nondistended nontympanitic and nontender.  Lab Results: CBC  Recent Labs    02/26/18 1340  WBC 8.3  HGB 14.5  HCT 40.5  PLT 237   BMET Recent Labs    02/26/18 1340 02/27/18 0435  NA 139 138  K 4.2 3.6  CL 99* 106  CO2 25 23  GLUCOSE 143* 113*  BUN 20 18  CREATININE 0.86 0.71  CALCIUM 10.5* 9.0   PT/INR No results for input(s): LABPROT, INR in the last 72 hours. ABG No results for input(s): PHART, HCO3 in the last 72 hours.  Invalid input(s): PCO2, PO2  Studies/Results: Dg Abdomen 1 View  Result Date: 02/26/2018 CLINICAL DATA:  NG tube placement EXAM: ABDOMEN - 1 VIEW COMPARISON:  CT 02/26/2018, radiograph [id number] FINDINGS: Lung bases clear. Esophageal tube tip overlies the GE junction, side-port over the distal esophagus. IMPRESSION: Esophageal tube side port projects over distal esophagus and the tip overlies the GE junction, consider further advancement for more optimal positioning. Electronically Signed   By: Donavan Foil M.D.   On: 02/26/2018 19:19   Ct Abdomen Pelvis W Contrast  Result Date: 02/26/2018 CLINICAL DATA:  Abdominal pain and nausea for 1 day. History of small  bowel obstruction. EXAM: CT ABDOMEN AND PELVIS WITH CONTRAST TECHNIQUE: Multidetector CT imaging of the abdomen and pelvis was performed using the standard protocol following bolus administration of intravenous contrast. CONTRAST:  133mL ISOVUE-300 IOPAMIDOL (ISOVUE-300) INJECTION 61% COMPARISON:  Abdominal radiograph February 26, 2018 and CT abdomen and pelvis March 29, 2012. FINDINGS: LOWER CHEST: Lung bases are clear. Included heart size is normal. No pericardial effusion. HEPATOBILIARY: Liver and gallbladder are normal. PANCREAS: Normal. SPLEEN: Normal. ADRENALS/URINARY TRACT: Kidneys are orthotopic, demonstrating symmetric enhancement. No nephrolithiasis, hydronephrosis or solid renal masses. Too small to characterize hypodensities bilateral kidneys with small LEFT parapelvic cysts. The unopacified ureters are normal in course and caliber. Delayed imaging through the kidneys demonstrates symmetric prompt contrast excretion within the proximal urinary collecting system. Urinary bladder is partially distended and unremarkable. Normal adrenal glands. STOMACH/BOWEL: Stable small hiatal hernia and posterolateral directed small suspected diverticulum. Small bowel dilatation to 3.6 cm with transition point RIGHT mid abdomen (same location of prior CT with small bowel obstruction), upstream small bowel feces. Large bowel normal in course and caliber. Moderate sigmoid colonic diverticulosis. Normal appendix. VASCULAR/LYMPHATIC: Aortoiliac vessels are normal in course and caliber. No lymphadenopathy by CT size criteria. REPRODUCTIVE: Uterine prolapse, lower uterine segment within the vagina. OTHER: Small volume low-density free fluid in the pelvis. No focal fluid collections or intraperitoneal free air. MUSCULOSKELETAL: Nonacute. Moderate degenerative change of the hips. Moderate degenerative change of lower lumbar spine. IMPRESSION: 1. Recurrent small bowel obstruction with transition  point RIGHT mid abdomen. 2. Small  volume ascites without abscess. Aortic Atherosclerosis (ICD10-I70.0). Electronically Signed   By: Elon Alas M.D.   On: 02/26/2018 17:44   Dg Abd 2 Views  Result Date: 02/26/2018 CLINICAL DATA:  Abdominal pain and nausea EXAM: ABDOMEN - 2 VIEW COMPARISON:  02/01/2016 FINDINGS: Scattered large and small bowel gas is noted. Mild retained fecal material is seen. No free air is seen. A single mildly prominent loop of small bowel is noted without definitive obstructive change. No other focal abnormality is seen. IMPRESSION: Mild retained fecal material. Single prominent loop of small bowel which may represent some mild enteritis. Electronically Signed   By: Inez Catalina M.D.   On: 02/26/2018 16:06    Anti-infectives: Anti-infectives (From admission, onward)   None      Assessment/Plan:  Labs drawn today.  No films ordered today.  This patient with small bowel obstruction.  It appears that her bowel obstruction is resolved as she is passing gas.  She has no distention and no pain or tenderness.  I will check films this morning and possibly remove her nasogastric tube and advance her diet to clear liquids later if the x-ray is favorable.  Florene Glen, MD, FACS  02/28/2018

## 2018-02-28 NOTE — Progress Notes (Signed)
Patient is tolerated having her nasogastric tube clamped.  And then ultimately removed.  She feels well.  Will likely be able to advance diet in the morning and possibly discharge.

## 2018-03-01 NOTE — Discharge Instructions (Signed)
Resume all home medications Resume normal activity Follow-up with primary care physician in 2 weeks. Return to emergency room if nausea vomiting recurs.

## 2018-03-01 NOTE — Progress Notes (Signed)
CC: SBO Subjective: This a patient admitted to the hospital signs of a small bowel obstruction.  This is completely resolved.  She has no nausea vomiting fevers or chills is passing gas and has had no abdominal pain.  She is tolerating a clear liquid diet. Objective: Vital signs in last 24 hours: Temp:  [97.9 F (36.6 C)-98.8 F (37.1 C)] 98.4 F (36.9 C) (03/30 0502) Pulse Rate:  [71-82] 80 (03/30 0502) Resp:  [17-20] 18 (03/30 0502) BP: (138-180)/(58-69) 149/59 (03/30 0502) SpO2:  [97 %-99 %] 97 % (03/30 0502) Last BM Date: 02/28/18  Intake/Output from previous day: 03/29 0701 - 03/30 0700 In: 1853 [P.O.:1000; I.V.:853] Out: 550 [Urine:500; Emesis/NG output:50] Intake/Output this shift: No intake/output data recorded.  Physical exam:  Vital signs stable and reviewed abdomen soft nontender nondistended.  Lab Results: CBC  Recent Labs    02/26/18 1340  WBC 8.3  HGB 14.5  HCT 40.5  PLT 237   BMET Recent Labs    02/26/18 1340 02/27/18 0435  NA 139 138  K 4.2 3.6  CL 99* 106  CO2 25 23  GLUCOSE 143* 113*  BUN 20 18  CREATININE 0.86 0.71  CALCIUM 10.5* 9.0   PT/INR No results for input(s): LABPROT, INR in the last 72 hours. ABG No results for input(s): PHART, HCO3 in the last 72 hours.  Invalid input(s): PCO2, PO2  Studies/Results: Dg Abd 2 Views  Result Date: 02/28/2018 CLINICAL DATA:  At abdominal pain EXAM: ABDOMEN - 2 VIEW COMPARISON:  Abdominal radiograph February 26, 2018; CT abdomen and pelvis February 26, 2018 FINDINGS: Nasogastric tube tip is in the proximal stomach with the side port at the gastroesophageal junction. There is moderate stool in the colon. There is no appreciable bowel dilatation currently. No air-fluid levels. No free air. There are phleboliths in the pelvis. Lung bases are clear. IMPRESSION: Nasogastric tube tip in proximal stomach with side port at gastroesophageal junction. It may be prudent to advance the nasogastric tube approximately  5-6 cm to insure that the tube tip and side port are both well within the stomach. No bowel dilatation or air-fluid level to suggest bowel obstruction currently. No free air. Lung bases clear. Electronically Signed   By: Lowella Grip III M.D.   On: 02/28/2018 09:44    Anti-infectives: Anti-infectives (From admission, onward)   None      Assessment/Plan:  We will advance diet this morning and discharge after breakfast.  She will follow-up with her primary care physician.  Resume all home medications.  Florene Glen, MD, FACS  03/01/2018

## 2018-03-01 NOTE — Discharge Summary (Signed)
Physician Discharge Summary  Patient ID: Bianca Malone MRN: [id number] DOB/AGE: 1935-06-24 83 y.o.  Admit date: 02/26/2018 Discharge date: 03/01/2018   Discharge Diagnoses:  Active Problems:   SBO (small bowel obstruction) (HCC)   Small bowel obstruction due to postoperative adhesions   Procedures: None  Hospital Course: This patient admitted to the hospital with signs of a small bowel obstruction.  She has had this happen in the past.  It is spontaneously resolved during this hospitalization with the treatment of nasogastric tube for decompression and serial exams.  She is tolerating a clear liquid diet and will be advanced to a full liquid diet.  Her pain is resolved she has no nausea vomiting fevers or chills and is passing gas.  She will follow-up with her primary care physician in 2 weeks or return to the emergency room should she have any additional symptoms.  She will resume home medications and advance diet through soft diet as tolerated.  Consults: None  Disposition: Discharge disposition: 01-Home or Self Care        Allergies as of 03/01/2018      Reactions   Loratadine    Amoxicillin Hives, Rash      Daypro  [oxaprozin] Rash   Sulfa Antibiotics Rash   presuptive reaction      Medication List    TAKE these medications   aspirin 81 MG chewable tablet Chew by mouth.   CALCIUM CARBONATE-VITAMIN D PO Take 1 tablet by mouth daily. Reported on 03/08/2016   dicyclomine 20 MG tablet Commonly known as:  BENTYL Take 20 mg by mouth every 6 (six) hours as needed for spasms.   ibuprofen 600 MG tablet Commonly known as:  ADVIL,MOTRIN Take 1 tablet (600 mg total) by mouth every 6 (six) hours as needed.   losartan 100 MG tablet Commonly known as:  COZAAR Take 1 tablet (100 mg total) by mouth daily.   pantoprazole 40 MG tablet Commonly known as:  PROTONIX TAKE 1 TABLET (40 MG TOTAL) BY MOUTH DAILY.   polyethylene glycol packet Commonly known as:  MIRALAX /  GLYCOLAX Take 17 g by mouth daily as needed.   promethazine 25 MG tablet Commonly known as:  PHENERGAN Take 25 mg by mouth every 6 (six) hours as needed for nausea or vomiting.      Follow-up Information    Jerrol Banana., MD Follow up in 2 week(s).   Specialty:  Family Medicine Contact information: [address] [place] [address] [phone]           Florene Glen, MD, FACS

## 2018-03-03 ENCOUNTER — Telehealth: Payer: Self-pay

## 2018-03-03 NOTE — Telephone Encounter (Signed)
Transition Care Management Follow-Up Telephone Call   Date discharged and where: [facility] on 03/01/18  How have you been since you were released from the hospital? Doing fine, still weak an was lightheaded this AM but thinks that is because she's on a liquid/soft diet. Pt states she will be slowly adding in normal foods tomorrow. Declines n/v/d, pain or fever.   Any patient concerns? When will it reoccur?  Items Reviewed:   Meds: verified  Allergies: verified  Dietary Changes Reviewed: soft diet currently  Functional Questionnaire:  Independent-I Dependent-D  ADLs:   Dressing- I    Eating- I   Maintaining continence- I   Transferring- I   Transportation- I   Meal Prep- I   Managing Meds- I  Confirmed importance and Date/Time of follow-up visits scheduled: 03/13/18 @ 2:20 PM.   Confirmed with patient if condition worsens to call PCP or go to the Emergency Dept. Patient was given office number and encouraged to call back with questions or concerns: YES

## 2018-03-13 ENCOUNTER — Ambulatory Visit (INDEPENDENT_AMBULATORY_CARE_PROVIDER_SITE_OTHER): Payer: Medicare Other | Admitting: Family Medicine

## 2018-03-13 ENCOUNTER — Encounter: Payer: Self-pay | Admitting: Family Medicine

## 2018-03-13 VITALS — BP 144/56 | HR 62 | Temp 97.9°F | Resp 14 | Wt 133.0 lb

## 2018-03-13 DIAGNOSIS — K56609 Unspecified intestinal obstruction, unspecified as to partial versus complete obstruction: Secondary | ICD-10-CM

## 2018-03-13 DIAGNOSIS — Z09 Encounter for follow-up examination after completed treatment for conditions other than malignant neoplasm: Secondary | ICD-10-CM | POA: Diagnosis not present

## 2018-03-13 DIAGNOSIS — R531 Weakness: Secondary | ICD-10-CM

## 2018-03-13 DIAGNOSIS — R42 Dizziness and giddiness: Secondary | ICD-10-CM | POA: Diagnosis not present

## 2018-03-13 DIAGNOSIS — I10 Essential (primary) hypertension: Secondary | ICD-10-CM

## 2018-03-13 NOTE — Progress Notes (Signed)
Patient: Bianca Malone Female    DOB: 12-12-1934   83 y.o.   MRN: [id number] Visit Date: 03/13/2018  Today's Provider: Wilhemena Durie, MD   Chief Complaint  Patient presents with  . Hospitalization Follow-up   Subjective:    HPI Hospital stay 02/26/18-03/01/18  Diagnoses- small bowel obstruction due to post operative adhesions. It is spontaneously resolved during hospitalization with the treatment of nasogastric tube for decompression and serial exams. Pt tolerated clear liquids diet and advanced to full liquid diet. Resume medications and diet as tolerated.  Pt reports that she has not gotten back to herself yet. She is not as steady on her feet yet. She feels a little lightheaded and she feels this is coming from laying in bed for 4 days and not being about to eat anything. She is slowing getting her strength back.       Allergies  Allergen Reactions  . Loratadine   . Amoxicillin Hives and Rash       . Daypro  [Oxaprozin] Rash  . Sulfa Antibiotics Rash    presuptive reaction     Current Outpatient Medications:  .  aspirin 81 MG chewable tablet, Chew by mouth. , Disp: , Rfl:  .  CALCIUM CARBONATE-VITAMIN D PO, Take 1 tablet by mouth daily. Reported on 03/08/2016, Disp: , Rfl:  .  dicyclomine (BENTYL) 20 MG tablet, Take 20 mg by mouth every 6 (six) hours as needed for spasms., Disp: , Rfl:  .  ibuprofen (ADVIL,MOTRIN) 600 MG tablet, Take 1 tablet (600 mg total) by mouth every 6 (six) hours as needed., Disp: 90 tablet, Rfl: 2 .  losartan (COZAAR) 100 MG tablet, Take 1 tablet (100 mg total) by mouth daily., Disp: 90 tablet, Rfl: 3 .  pantoprazole (PROTONIX) 40 MG tablet, TAKE 1 TABLET (40 MG TOTAL) BY MOUTH DAILY., Disp: 90 tablet, Rfl: 3 .  polyethylene glycol (MIRALAX / GLYCOLAX) packet, Take 17 g by mouth daily as needed., Disp: , Rfl:  .  promethazine (PHENERGAN) 25 MG tablet, Take 25 mg by mouth every 6 (six) hours as needed for nausea or vomiting., Disp: , Rfl:    Review of Systems  Constitutional: Negative.   HENT: Negative.   Eyes: Negative.   Respiratory: Negative.   Cardiovascular: Negative.   Gastrointestinal: Negative.   Endocrine: Negative.   Genitourinary: Negative.   Musculoskeletal: Negative.   Skin: Negative.   Allergic/Immunologic: Negative.   Neurological: Positive for light-headedness.  Hematological: Negative.   Psychiatric/Behavioral: Negative.     Social History   Tobacco Use  . Smoking status: Never Smoker  . Smokeless tobacco: Never Used  Substance Use Topics  . Alcohol use: Yes    Alcohol/week: 0.6 - 1.2 oz    Types: 1 - 2 Glasses of wine per week   Objective:   BP (!) 144/56 (BP Location: Left Arm, Patient Position: Sitting, Cuff Size: Normal)   Pulse 62   Temp 97.9 F (36.6 C) (Oral)   Resp 14   Wt 133 lb (60.3 kg)   BMI 22.13 kg/m  Vitals:   03/13/18 1430  BP: (!) 144/56  Pulse: 62  Resp: 14  Temp: 97.9 F (36.6 C)  TempSrc: Oral  Weight: 133 lb (60.3 kg)     Physical Exam  Constitutional: She is oriented to person, place, and time. She appears well-developed and well-nourished.  HENT:  Head: Normocephalic and atraumatic.  Eyes: No scleral icterus.  Neck: No thyromegaly present.  Cardiovascular: Normal rate, regular rhythm and normal heart sounds.  Pulmonary/Chest: Effort normal and breath sounds normal.  Abdominal: Soft.  Neurological: She is alert and oriented to person, place, and time.  Skin: Skin is warm and dry.  Psychiatric: She has a normal mood and affect. Her behavior is normal. Judgment and thought content normal.        Assessment & Plan:     1. Hospital discharge follow-up SBO Adhesions. Pt improving.  2. SBO (small bowel obstruction) ([place])   3. Dizziness Nonspecific. - CBC with Differential/Platelet - Comprehensive metabolic panel  4. Weakness Continue to get strngth back. - CBC with Differential/Platelet - Comprehensive metabolic panel  5. Essential  hypertension  - TSH  I have done the exam and reviewed the chart and it is accurate to the best of my knowledge. Development worker, community has been used and  any errors in dictation or transcription are unintentional. Miguel Aschoff M.D. [place], MD  [facility]

## 2018-03-14 LAB — COMPREHENSIVE METABOLIC PANEL
ALBUMIN: 4.3 g/dL (ref 3.5–4.7)
ALT: 13 IU/L (ref 0–32)
AST: 24 IU/L (ref 0–40)
Albumin/Globulin Ratio: 1.9 (ref 1.2–2.2)
Alkaline Phosphatase: 71 IU/L (ref 39–117)
BUN / CREAT RATIO: 21 (ref 12–28)
BUN: 18 mg/dL (ref 8–27)
Bilirubin Total: 0.8 mg/dL (ref 0.0–1.2)
CALCIUM: 9.4 mg/dL (ref 8.7–10.3)
CO2: 22 mmol/L (ref 20–29)
CREATININE: 0.86 mg/dL (ref 0.57–1.00)
Chloride: 101 mmol/L (ref 96–106)
GFR calc Af Amer: 73 mL/min/{1.73_m2} (ref >59)
GFR, EST NON AFRICAN AMERICAN: 64 mL/min/{1.73_m2} (ref >59)
Globulin, Total: 2.3 g/dL (ref 1.5–4.5)
Glucose: 109 mg/dL — ABNORMAL HIGH (ref 65–99)
Potassium: 4.2 mmol/L (ref 3.5–5.2)
SODIUM: 137 mmol/L (ref 134–144)
Total Protein: 6.6 g/dL (ref 6.0–8.5)

## 2018-03-14 LAB — CBC WITH DIFFERENTIAL/PLATELET
BASOS: 1 %
Basophils Absolute: 0 10*3/uL (ref 0.0–0.2)
EOS (ABSOLUTE): 0.2 10*3/uL (ref 0.0–0.4)
EOS: 4 %
HEMATOCRIT: 35.5 % (ref 34.0–46.6)
HEMOGLOBIN: 11.8 g/dL (ref 11.1–15.9)
IMMATURE GRANS (ABS): 0 10*3/uL (ref 0.0–0.1)
IMMATURE GRANULOCYTES: 0 %
LYMPHS: 25 %
Lymphocytes Absolute: 1.6 10*3/uL (ref 0.7–3.1)
MCH: 29.9 pg (ref 26.6–33.0)
MCHC: 33.2 g/dL (ref 31.5–35.7)
MCV: 90 fL (ref 79–97)
Monocytes Absolute: 0.5 10*3/uL (ref 0.1–0.9)
Monocytes: 8 %
Neutrophils Absolute: 4 10*3/uL (ref 1.4–7.0)
Neutrophils: 62 %
Platelets: 243 10*3/uL (ref 150–379)
RBC: 3.95 x10E6/uL (ref 3.77–5.28)
RDW: 13.4 % (ref 12.3–15.4)
WBC: 6.4 10*3/uL (ref 3.4–10.8)

## 2018-03-14 LAB — TSH: TSH: 3.04 u[IU]/mL (ref 0.450–4.500)

## 2018-03-17 ENCOUNTER — Telehealth: Payer: Self-pay | Admitting: Emergency Medicine

## 2018-03-17 NOTE — Telephone Encounter (Signed)
-----   Message from Jerrol Banana., MD sent at 03/16/2018  8:08 PM EDT ----- Labs ok

## 2018-03-17 NOTE — Telephone Encounter (Signed)
LMTCB

## 2018-03-19 NOTE — Telephone Encounter (Signed)
Advised  ED 

## 2018-03-27 ENCOUNTER — Other Ambulatory Visit: Payer: Self-pay | Admitting: Family Medicine

## 2018-04-02 ENCOUNTER — Telehealth: Payer: Self-pay | Admitting: Family Medicine

## 2018-04-02 NOTE — Telephone Encounter (Signed)
Spoke with patient she states she talked to doctor Rosanna Randy and he was ok with her not doing the AWV this year.Marland KitchenMarland KitchenMarland KitchenMarland Kitchenper patient.

## 2018-04-09 ENCOUNTER — Ambulatory Visit: Payer: Self-pay | Admitting: Podiatry

## 2018-07-16 ENCOUNTER — Ambulatory Visit (INDEPENDENT_AMBULATORY_CARE_PROVIDER_SITE_OTHER): Payer: Medicare Other | Admitting: Family Medicine

## 2018-07-16 VITALS — BP 160/60 | HR 63 | Temp 97.6°F | Resp 16 | Wt 133.0 lb

## 2018-07-16 DIAGNOSIS — F419 Anxiety disorder, unspecified: Secondary | ICD-10-CM

## 2018-07-16 DIAGNOSIS — R002 Palpitations: Secondary | ICD-10-CM

## 2018-07-16 DIAGNOSIS — I059 Rheumatic mitral valve disease, unspecified: Secondary | ICD-10-CM

## 2018-07-16 DIAGNOSIS — R0602 Shortness of breath: Secondary | ICD-10-CM | POA: Diagnosis not present

## 2018-07-16 DIAGNOSIS — I10 Essential (primary) hypertension: Secondary | ICD-10-CM | POA: Diagnosis not present

## 2018-07-16 MED ORDER — HYDRALAZINE HCL 25 MG PO TABS: 25.0000 mg | ORAL_TABLET | Freq: Two times a day (BID) | ORAL | 11 refills | Status: DC

## 2018-07-16 NOTE — Progress Notes (Signed)
Bianca Malone  MRN: [id number] DOB: December 01, 1935  Subjective:  HPI   The patient is an 83 year old female who presents for follow up of her hypertension.  She was last seen on 03/13/18.   BP Readings from Last 3 Encounters:  07/16/18 (!) 160/60  03/13/18 (!) 144/56  03/01/18 (!) 149/59   The patient states she has had a little more shortness of breath with exertion than she normally has had in the past.  She said she still exercises regularly and does not notice any changes in her breathing habits with her exercise.  No CP but she says she has worsening DOE in recent months. Some palpitations at times.  Patient Active Problem List   Diagnosis Date Noted  . Small bowel obstruction due to postoperative adhesions 02/26/2018  . Back pain with sciatica 05/13/2017  . Allergic rhinitis 08/19/2015  . Anxiety 08/19/2015  . SBO (small bowel obstruction) ([place]) 08/19/2015  . Arthritis 08/19/2015  . Osteoarthrosis 08/19/2015  . Gastro-esophageal reflux disease without esophagitis 08/19/2015  . 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 BP (high blood pressure) 08/19/2015  . Mitral valve disorder 08/19/2015  . Post menopausal syndrome 08/19/2015  . Thyroid nodule 08/19/2015    Past Medical History:  Diagnosis Date  . Arthritis   . GERD (gastroesophageal reflux disease)   . Hypertension   . Vaginal prolapse     Social History   Socioeconomic History  . Marital status: Married    Spouse name: Not on file  . Number of children: Not on file  . Years of education: Not on file  . Highest education level: Not on file  Occupational History  . Not on file  Social Needs  . Financial resource strain: Not on file  . Food insecurity:    Worry: Not on file    Inability: Not on file  . Transportation needs:    Medical: Not on file    Non-medical: Not on file  Tobacco Use  . Smoking status: Never Smoker  . Smokeless tobacco: Never Used  Substance and Sexual Activity    . Alcohol use: Yes    Alcohol/week: 1.0 - 2.0 standard drinks    Types: 1 - 2 Glasses of wine per week  . Drug use: No  . Sexual activity: Yes    Birth control/protection: Condom  Lifestyle  . Physical activity:    Days per week: Not on file    Minutes per session: Not on file  . Stress: Not on file  Relationships  . Social connections:    Talks on phone: Not on file    Gets together: Not on file    Attends religious service: Not on file    Active member of club or organization: Not on file    Attends meetings of clubs or organizations: Not on file    Relationship status: Not on file  . Intimate partner violence:    Fear of current or ex partner: Not on file    Emotionally abused: Not on file    Physically abused: Not on file    Forced sexual activity: Not on file  Other Topics Concern  . Not on file  Social History Narrative  . Not on file    Outpatient Encounter Medications as of 07/16/2018  Medication Sig  . aspirin 81 MG chewable tablet Chew by mouth.   Marland Kitchen CALCIUM CARBONATE-VITAMIN D PO Take 1 tablet by mouth daily. Reported on 03/08/2016  .  dicyclomine (BENTYL) 20 MG tablet Take 20 mg by mouth every 6 (six) hours as needed for spasms.  Marland Kitchen ibuprofen (ADVIL,MOTRIN) 600 MG tablet Take 1 tablet (600 mg total) by mouth every 6 (six) hours as needed.  Marland Kitchen losartan (COZAAR) 100 MG tablet Take 1 tablet (100 mg total) by mouth daily.  . pantoprazole (PROTONIX) 40 MG tablet TAKE 1 TABLET BY MOUTH EVERY DAY  . polyethylene glycol (MIRALAX / GLYCOLAX) packet Take 17 g by mouth daily as needed.  . promethazine (PHENERGAN) 25 MG tablet Take 25 mg by mouth every 6 (six) hours as needed for nausea or vomiting.   No facility-administered encounter medications on file as of 07/16/2018.     Allergies  Allergen Reactions  . Loratadine   . Amoxicillin Hives and Rash       . Daypro  [Oxaprozin] Rash  . Sulfa Antibiotics Rash    presuptive reaction    Review of Systems   Constitutional: Negative for fever and malaise/fatigue.  HENT: Negative.   Eyes: Negative.   Respiratory: Positive for cough (dry) and shortness of breath (chronic possibly changed somewhat.). Negative for sputum production and wheezing.   Cardiovascular: Positive for palpitations. Negative for chest pain, orthopnea, claudication and leg swelling.  Gastrointestinal: Negative.   Skin: Negative.   Neurological: Negative.   Endo/Heme/Allergies: Negative.   Psychiatric/Behavioral: The patient is nervous/anxious.     Objective:  BP (!) 160/60 (BP Location: Right Arm, Patient Position: Sitting, Cuff Size: Normal)   Pulse 63   Temp 97.6 F (36.4 C) (Oral)   Resp 16   Wt 133 lb (60.3 kg)   SpO2 99%   BMI 22.13 kg/m   Physical Exam  Constitutional: She is oriented to person, place, and time and well-developed, well-nourished, and in no distress.  HENT:  Head: Normocephalic and atraumatic.  Right Ear: External ear normal.  Left Ear: External ear normal.  Nose: Nose normal.  Eyes: Conjunctivae are normal. No scleral icterus.  Neck: No thyromegaly present.  Cardiovascular: Normal rate, regular rhythm, normal heart sounds and intact distal pulses.  Pulmonary/Chest: Effort normal and breath sounds normal.  Abdominal: Soft.  Musculoskeletal: She exhibits no edema.  Neurological: She is alert and oriented to person, place, and time. Gait normal. GCS score is 15.  Skin: Skin is warm and dry.  Psychiatric: Mood, memory, affect and judgment normal.    Assessment and Plan :  Essential hypertension  Due to side effects with meds will try Hydralazine 25 mg BID.RTC 2 months. Anxiety Palpitations Dyspnea on Exertion Refer to cardiology.  I have done the exam and reviewed the chart and it is accurate to the best of my knowledge. Development worker, community has been used and  any errors in dictation or transcription are unintentional. Miguel Aschoff M.D. [facility]

## 2018-07-18 DIAGNOSIS — R0602 Shortness of breath: Secondary | ICD-10-CM | POA: Insufficient documentation

## 2018-09-16 ENCOUNTER — Ambulatory Visit (INDEPENDENT_AMBULATORY_CARE_PROVIDER_SITE_OTHER): Payer: Medicare Other | Admitting: Family Medicine

## 2018-09-16 VITALS — BP 184/65 | HR 69 | Temp 97.9°F | Resp 16 | Wt 138.0 lb

## 2018-09-16 DIAGNOSIS — Z23 Encounter for immunization: Secondary | ICD-10-CM

## 2018-09-16 DIAGNOSIS — I10 Essential (primary) hypertension: Secondary | ICD-10-CM | POA: Diagnosis not present

## 2018-09-16 NOTE — Progress Notes (Signed)
Bianca Malone  MRN: [id number] DOB: 04-14-1935  Subjective:  HPI   The patient is an 83 year old female who presents today for follow up of her hypertension.  She was last seen here for this on 07/16/18.  At that time she had complaint of Dyspnea on exertion.  She was referred to cardiology. Due to side effects with other antihypertensive medications the patient was started on Hydralazine 25 mg twice daily.  BP Readings from Last 3 Encounters:  09/16/18 (!) 184/65  07/16/18 (!) 160/60  03/13/18 (!) 144/56   When seen by cardiology her blood pressure readings were 126/82 on 07/18/18 and 142/68 on 08/07/18.  The patient had a normal stress test done on 07/31/18.  However on the day of the stress test they have her blood pressure recorded as; Resting 199/72 and after recovery of ETT 192/80.  The patient has been checking her blood pressure at home and has been getting readings this month of 110's-130's over 50's-60's.   Patient Active Problem List   Diagnosis Date Noted  . Small bowel obstruction due to postoperative adhesions 02/26/2018  . Back pain with sciatica 05/13/2017  . Allergic rhinitis 08/19/2015  . Anxiety 08/19/2015  . SBO (small bowel obstruction) ([place]) 08/19/2015  . Arthritis 08/19/2015  . Osteoarthrosis 08/19/2015  . Gastro-esophageal reflux disease without esophagitis 08/19/2015  . 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 BP (high blood pressure) 08/19/2015  . Mitral valve disorder 08/19/2015  . Post menopausal syndrome 08/19/2015  . Thyroid nodule 08/19/2015    Past Medical History:  Diagnosis Date  . Arthritis   . GERD (gastroesophageal reflux disease)   . Hypertension   . Vaginal prolapse     Social History   Socioeconomic History  . Marital status: Married    Spouse name: Not on file  . Number of children: Not on file  . Years of education: Not on file  . Highest education level: Not on file  Occupational History  . Not on file    Social Needs  . Financial resource strain: Not on file  . Food insecurity:    Worry: Not on file    Inability: Not on file  . Transportation needs:    Medical: Not on file    Non-medical: Not on file  Tobacco Use  . Smoking status: Never Smoker  . Smokeless tobacco: Never Used  Substance and Sexual Activity  . Alcohol use: Yes    Alcohol/week: 1.0 - 2.0 standard drinks    Types: 1 - 2 Glasses of wine per week  . Drug use: No  . Sexual activity: Yes    Birth control/protection: Condom  Lifestyle  . Physical activity:    Days per week: Not on file    Minutes per session: Not on file  . Stress: Not on file  Relationships  . Social connections:    Talks on phone: Not on file    Gets together: Not on file    Attends religious service: Not on file    Active member of club or organization: Not on file    Attends meetings of clubs or organizations: Not on file    Relationship status: Not on file  . Intimate partner violence:    Fear of current or ex partner: Not on file    Emotionally abused: Not on file    Physically abused: Not on file    Forced sexual activity: Not on file  Other Topics Concern  .  Not on file  Social History Narrative  . Not on file    Outpatient Encounter Medications as of 09/16/2018  Medication Sig  . aspirin 81 MG chewable tablet Chew by mouth.   Marland Kitchen CALCIUM CARBONATE-VITAMIN D PO Take 1 tablet by mouth daily. Reported on 03/08/2016  . dicyclomine (BENTYL) 20 MG tablet Take 20 mg by mouth every 6 (six) hours as needed for spasms.  . hydrALAZINE (APRESOLINE) 25 MG tablet Take 1 tablet (25 mg total) by mouth 2 (two) times daily.  Marland Kitchen ibuprofen (ADVIL,MOTRIN) 600 MG tablet Take 1 tablet (600 mg total) by mouth every 6 (six) hours as needed.  Marland Kitchen losartan (COZAAR) 100 MG tablet Take 1 tablet (100 mg total) by mouth daily.  . pantoprazole (PROTONIX) 40 MG tablet TAKE 1 TABLET BY MOUTH EVERY DAY  . polyethylene glycol (MIRALAX / GLYCOLAX) packet Take 17 g by  mouth daily as needed.  . promethazine (PHENERGAN) 25 MG tablet Take 25 mg by mouth every 6 (six) hours as needed for nausea or vomiting.   No facility-administered encounter medications on file as of 09/16/2018.     Allergies  Allergen Reactions  . Loratadine   . Amoxicillin Hives and Rash       . Daypro  [Oxaprozin] Rash  . Sulfa Antibiotics Rash    presuptive reaction    Review of Systems  Constitutional: Negative for fever and malaise/fatigue.  Respiratory: Positive for cough. Negative for shortness of breath and wheezing.   Cardiovascular: Negative for chest pain, palpitations and leg swelling.  Endo/Heme/Allergies: Negative.   Psychiatric/Behavioral: Negative.     Objective:  BP (!) 184/65 (BP Location: Right Arm, Patient Position: Sitting, Cuff Size: Normal)   Pulse 69   Temp 97.9 F (36.6 C) (Oral)   Resp 16   Wt 138 lb (62.6 kg)   BMI 22.96 kg/m    Repeat was 171/67 and 187/64  Physical Exam  Constitutional: She is oriented to person, place, and time and well-developed, well-nourished, and in no distress.  HENT:  Head: Normocephalic and atraumatic.  Right Ear: External ear normal.  Left Ear: External ear normal.  Nose: Nose normal.  Eyes: Conjunctivae are normal. No scleral icterus.  Neck: No thyromegaly present.  Cardiovascular: Normal rate, regular rhythm and normal heart sounds.  Pulmonary/Chest: Effort normal and breath sounds normal.  Musculoskeletal: She exhibits no edema.  Neurological: She is alert and oriented to person, place, and time. Gait normal. GCS score is 15.  Skin: Skin is warm.  Psychiatric: Mood, memory, affect and judgment normal.    Assessment and Plan :  1. Need for influenza vaccination  - Flu vaccine HIGH DOSE PF (Fluzone High dose)  2. Essential hypertension Pt wishes to watch BP and bring in cuff. Would increase Hydralazine to 50mg  BI from 25 TID. RTC 2-4 weeks.  I have done the exam and reviewed the chart and it is  accurate to the best of my knowledge. Development worker, community has been used and  any errors in dictation or transcription are unintentional. Miguel Aschoff M.D. [facility]

## 2018-10-07 ENCOUNTER — Ambulatory Visit (INDEPENDENT_AMBULATORY_CARE_PROVIDER_SITE_OTHER): Payer: Medicare Other | Admitting: Family Medicine

## 2018-10-07 ENCOUNTER — Encounter: Payer: Self-pay | Admitting: Family Medicine

## 2018-10-07 VITALS — BP 140/58 | HR 56 | Temp 98.3°F | Resp 16 | Ht 65.0 in | Wt 134.0 lb

## 2018-10-07 DIAGNOSIS — I10 Essential (primary) hypertension: Secondary | ICD-10-CM | POA: Diagnosis not present

## 2018-10-07 DIAGNOSIS — F419 Anxiety disorder, unspecified: Secondary | ICD-10-CM

## 2018-10-07 DIAGNOSIS — K219 Gastro-esophageal reflux disease without esophagitis: Secondary | ICD-10-CM | POA: Diagnosis not present

## 2018-10-07 NOTE — Progress Notes (Signed)
Patient: Bianca Malone Female    DOB: 05-24-1935   83 y.o.   MRN: [id number] Visit Date: 10/07/2018  Today's Provider: Wilhemena Durie, MD   Chief Complaint  Patient presents with  . Hypertension   Subjective:    HPI  Hypertension, follow-up:  BP Readings from Last 3 Encounters:  09/16/18 (!) 184/65  07/16/18 (!) 160/60  03/13/18 (!) 144/56    She was last seen for hypertension 3 weeks ago.  BP at that visit was 184/65. Management since that visit includes increasing Hydralazine from 50mg  BID to 25mg  TID . She reports good compliance with treatment. She is not having side effects. She is exercising. She is adherent to low salt diet.   Outside blood pressures are checked daily. Patient reports that her BP have averaged in the 140s-120s/50s-60s She is experiencing none.  Patient denies exertional chest pressure/discomfort, lower extremity edema and palpitations.   Cardiovascular risk factors include dyslipidemia.   Weight trend: stable Wt Readings from Last 3 Encounters:  10/07/18 134 lb (60.8 kg)  09/16/18 138 lb (62.6 kg)  07/16/18 133 lb (60.3 kg)    Current diet: well balanced     Allergies  Allergen Reactions  . Loratadine   . Amoxicillin Hives and Rash       . Daypro  [Oxaprozin] Rash  . Sulfa Antibiotics Rash    presuptive reaction     Current Outpatient Medications:  .  aspirin 81 MG chewable tablet, Chew by mouth. , Disp: , Rfl:  .  CALCIUM CARBONATE-VITAMIN D PO, Take 1 tablet by mouth daily. Reported on 03/08/2016, Disp: , Rfl:  .  dicyclomine (BENTYL) 20 MG tablet, Take 20 mg by mouth every 6 (six) hours as needed for spasms., Disp: , Rfl:  .  hydrALAZINE (APRESOLINE) 25 MG tablet, Take 1 tablet (25 mg total) by mouth 2 (two) times daily., Disp: 60 tablet, Rfl: 11 .  ibuprofen (ADVIL,MOTRIN) 600 MG tablet, Take 1 tablet (600 mg total) by mouth every 6 (six) hours as needed., Disp: 90 tablet, Rfl: 2 .  losartan (COZAAR) 100 MG tablet, Take  1 tablet (100 mg total) by mouth daily., Disp: 90 tablet, Rfl: 3 .  pantoprazole (PROTONIX) 40 MG tablet, TAKE 1 TABLET BY MOUTH EVERY DAY, Disp: 90 tablet, Rfl: 3 .  polyethylene glycol (MIRALAX / GLYCOLAX) packet, Take 17 g by mouth daily as needed., Disp: , Rfl:  .  promethazine (PHENERGAN) 25 MG tablet, Take 25 mg by mouth every 6 (six) hours as needed for nausea or vomiting., Disp: , Rfl:   Review of Systems  Constitutional: Negative for appetite change, fatigue and fever.  HENT: Negative.   Eyes: Negative.   Respiratory: Negative for cough and shortness of breath.   Cardiovascular: Negative for chest pain, palpitations and leg swelling.  Gastrointestinal: Negative.   Endocrine: Negative.   Musculoskeletal: Negative.   Allergic/Immunologic: Negative.   Neurological: Negative for dizziness and headaches.  Psychiatric/Behavioral: Negative.     Social History   Tobacco Use  . Smoking status: Never Smoker  . Smokeless tobacco: Never Used  Substance Use Topics  . Alcohol use: Yes    Alcohol/week: 1.0 - 2.0 standard drinks    Types: 1 - 2 Glasses of wine per week   Objective:   Pulse (!) 56   Temp 98.3 F (36.8 C)   Resp 16   Ht 5\' 5"  (1.651 m)   Wt 134 lb (60.8 kg)  SpO2 100%   BMI 22.30 kg/m  Vitals:   10/07/18 1132  Pulse: (!) 56  Resp: 16  Temp: 98.3 F (36.8 C)  SpO2: 100%  Weight: 134 lb (60.8 kg)  Height: 5\' 5"  (1.651 m)     Physical Exam  Constitutional: She is oriented to person, place, and time. She appears well-developed and well-nourished.  HENT:  Head: Normocephalic and atraumatic.  Eyes: Conjunctivae are normal. No scleral icterus.  Neck: No thyromegaly present.  Cardiovascular: Normal rate, regular rhythm and normal heart sounds.  Pulmonary/Chest: Effort normal and breath sounds normal.  Abdominal: Soft.  Musculoskeletal: She exhibits no edema.  Neurological: She is alert and oriented to person, place, and time.  Skin: Skin is warm and dry.   Psychiatric: She has a normal mood and affect. Her behavior is normal. Judgment and thought content normal.        Assessment & Plan:     1. Essential hypertension Improved.  2. Gastro-esophageal reflux disease without esophagitis Stable.  3. Anxiety  I have done the exam and reviewed the chart and it is accurate to the best of my knowledge. Development worker, community has been used and  any errors in dictation or transcription are unintentional. Miguel Aschoff M.D. [place], MD  [facility]

## 2018-10-15 ENCOUNTER — Telehealth: Payer: Self-pay | Admitting: Family Medicine

## 2018-10-15 DIAGNOSIS — I10 Essential (primary) hypertension: Secondary | ICD-10-CM

## 2018-10-15 MED ORDER — LOSARTAN POTASSIUM 100 MG PO TABS: 100.0000 mg | ORAL_TABLET | Freq: Every day | ORAL | 0 refills | Status: DC

## 2018-10-15 NOTE — Telephone Encounter (Signed)
Pt called back to see if medication had been sent in. Pt was advised that Dr. Rosanna Randy is in seeing pts and messages are read in between seeing pts. Pt stated that she has to have losartan (COZAAR) 100 MG tablet today and needs it sent to [facility] at [place]. Please advise. Thanks TNP

## 2018-10-15 NOTE — Telephone Encounter (Signed)
Pt is on vacation and left her losartan (COZAAR) 100 MG tablet at home.  She is only needing enough to last for 5 days.  Pt is in [place].  Please call pt's husband to let him know what [facility] in this area can this be called into for the emergency amount.  Thanks, [name]

## 2018-12-01 ENCOUNTER — Telehealth: Payer: Self-pay | Admitting: Family Medicine

## 2018-12-01 DIAGNOSIS — K219 Gastro-esophageal reflux disease without esophagitis: Secondary | ICD-10-CM

## 2018-12-01 NOTE — Telephone Encounter (Signed)
The pantoprazole (PROTONIX) 40 MG tablet the pt has been taking is not working now for the past week and a half.  She is now taking tums along with the Rx at night to help.  Just wanting to know what she needs to do now.  Please advise.  Thanks, [name]

## 2018-12-09 ENCOUNTER — Other Ambulatory Visit: Payer: Self-pay | Admitting: Family Medicine

## 2018-12-09 DIAGNOSIS — Z1231 Encounter for screening mammogram for malignant neoplasm of breast: Secondary | ICD-10-CM

## 2018-12-10 MED ORDER — PANTOPRAZOLE SODIUM 40 MG PO TBEC: 40.0000 mg | DELAYED_RELEASE_TABLET | Freq: Two times a day (BID) | ORAL | 3 refills | Status: DC

## 2018-12-10 MED ORDER — CIMETIDINE 400 MG PO TABS: 400.0000 mg | ORAL_TABLET | Freq: Every day | ORAL | 5 refills | Status: DC

## 2018-12-10 NOTE — Telephone Encounter (Signed)
Patient was advised to take her Pantoprazole 40 mg BID and to started taking Cimetidine 400mg  [facility] per Dr.Gilbert.

## 2018-12-10 NOTE — Telephone Encounter (Signed)
Pt calling back.  She hasn't received a call back from last week.  Please advise.  Thanks, [name]

## 2018-12-11 ENCOUNTER — Ambulatory Visit
Admission: RE | Admit: 2018-12-11 | Discharge: 2018-12-11 | Disposition: A | Discharge status: Home / Self Care | Payer: Medicare Other | Source: Ambulatory Visit | Attending: Family Medicine | Admitting: Family Medicine

## 2018-12-11 ENCOUNTER — Other Ambulatory Visit: Payer: Self-pay | Admitting: Family Medicine

## 2018-12-11 DIAGNOSIS — N6320 Unspecified lump in the left breast, unspecified quadrant: Secondary | ICD-10-CM

## 2018-12-11 DIAGNOSIS — R928 Other abnormal and inconclusive findings on diagnostic imaging of breast: Secondary | ICD-10-CM

## 2018-12-11 DIAGNOSIS — Z1231 Encounter for screening mammogram for malignant neoplasm of breast: Secondary | ICD-10-CM | POA: Diagnosis present

## 2018-12-17 ENCOUNTER — Ambulatory Visit
Admission: RE | Admit: 2018-12-17 | Discharge: 2018-12-17 | Disposition: A | Discharge status: Home / Self Care | Payer: Medicare Other | Source: Ambulatory Visit | Attending: Family Medicine | Admitting: Family Medicine

## 2018-12-17 DIAGNOSIS — N6320 Unspecified lump in the left breast, unspecified quadrant: Secondary | ICD-10-CM | POA: Diagnosis present

## 2018-12-17 DIAGNOSIS — R928 Other abnormal and inconclusive findings on diagnostic imaging of breast: Secondary | ICD-10-CM | POA: Insufficient documentation

## 2019-01-05 ENCOUNTER — Other Ambulatory Visit: Payer: Self-pay | Admitting: Family Medicine

## 2019-01-05 DIAGNOSIS — I10 Essential (primary) hypertension: Secondary | ICD-10-CM

## 2019-01-20 ENCOUNTER — Other Ambulatory Visit: Payer: Self-pay | Admitting: Nurse Practitioner

## 2019-01-20 DIAGNOSIS — R221 Localized swelling, mass and lump, neck: Secondary | ICD-10-CM

## 2019-01-22 DIAGNOSIS — R09A2 Foreign body sensation, throat: Secondary | ICD-10-CM | POA: Insufficient documentation

## 2019-01-22 DIAGNOSIS — K449 Diaphragmatic hernia without obstruction or gangrene: Secondary | ICD-10-CM | POA: Insufficient documentation

## 2019-01-22 DIAGNOSIS — R221 Localized swelling, mass and lump, neck: Secondary | ICD-10-CM | POA: Insufficient documentation

## 2019-01-26 ENCOUNTER — Ambulatory Visit
Admission: RE | Admit: 2019-01-26 | Discharge: 2019-01-26 | Disposition: A | Discharge status: Home / Self Care | Payer: Medicare Other | Source: Ambulatory Visit | Attending: Nurse Practitioner | Admitting: Nurse Practitioner

## 2019-01-26 DIAGNOSIS — R221 Localized swelling, mass and lump, neck: Secondary | ICD-10-CM | POA: Insufficient documentation

## 2019-04-07 ENCOUNTER — Other Ambulatory Visit: Payer: Self-pay

## 2019-04-07 ENCOUNTER — Encounter: Payer: Self-pay | Admitting: Family Medicine

## 2019-04-07 ENCOUNTER — Ambulatory Visit (INDEPENDENT_AMBULATORY_CARE_PROVIDER_SITE_OTHER): Payer: Medicare Other | Admitting: Family Medicine

## 2019-04-07 VITALS — BP 130/70 | HR 70 | Temp 97.6°F | Wt 133.0 lb

## 2019-04-07 DIAGNOSIS — K219 Gastro-esophageal reflux disease without esophagitis: Secondary | ICD-10-CM | POA: Diagnosis not present

## 2019-04-07 DIAGNOSIS — S61215A Laceration without foreign body of left ring finger without damage to nail, initial encounter: Secondary | ICD-10-CM

## 2019-04-07 DIAGNOSIS — F419 Anxiety disorder, unspecified: Secondary | ICD-10-CM | POA: Diagnosis not present

## 2019-04-07 DIAGNOSIS — I10 Essential (primary) hypertension: Secondary | ICD-10-CM | POA: Diagnosis not present

## 2019-04-07 DIAGNOSIS — Z23 Encounter for immunization: Secondary | ICD-10-CM | POA: Diagnosis not present

## 2019-04-07 NOTE — Progress Notes (Signed)
Patient: Bianca Malone Female    DOB: [birth year]   84 y.o.   MRN: [id number] Visit Date: 04/07/2019  Today's Provider: Wilhemena Durie, MD   Chief Complaint  Patient presents with  . Hypertension   Subjective:     HPI  Hypertension, follow-up:  BP Readings from Last 3 Encounters:  04/07/19 130/70  10/07/18 (!) 140/58  09/16/18 (!) 184/65    She was last seen for hypertension 6 months ago.  BP at that visit was 140/58. Management since that visit includes no changes She reports good compliance with treatment. She is not having side effects. She is exercising. She is adherent to low salt diet.   Outside blood pressures are checked daily. Patient reports that her BP have averaged in the 140s-120s/50s-60s She is experiencing none.  Patient denies exertional chest pressure/discomfort, lower extremity edema and palpitations.   Cardiovascular risk factors include dyslipidemia.   Weight trend: stable    Wt Readings from Last 3 Encounters:  10/07/18 134 lb (60.8 kg)  09/16/18 138 lb (62.6 kg)  07/16/18 133 lb (60.3 kg)    Current diet: well balanced   Allergies  Allergen Reactions  . Loratadine   . Amoxicillin Hives and Rash       . Daypro  [Oxaprozin] Rash  . Sulfa Antibiotics Rash    presuptive reaction     Current Outpatient Medications:  .  aspirin 81 MG chewable tablet, Chew by mouth. , Disp: , Rfl:  .  CALCIUM CARBONATE-VITAMIN D PO, Take 1 tablet by mouth daily. Reported on 03/08/2016, Disp: , Rfl:  .  dicyclomine (BENTYL) 20 MG tablet, Take 20 mg by mouth every 6 (six) hours as needed for spasms., Disp: , Rfl:  .  hydrALAZINE (APRESOLINE) 25 MG tablet, Take 1 tablet (25 mg total) by mouth 2 (two) times daily., Disp: 60 tablet, Rfl: 11 .  ibuprofen (ADVIL,MOTRIN) 600 MG tablet, Take 1 tablet (600 mg total) by mouth every 6 (six) hours as needed., Disp: 90 tablet, Rfl: 2 .  losartan (COZAAR) 100 MG tablet, TAKE 1 TABLET BY MOUTH EVERY DAY, Disp:  90 tablet, Rfl: 3 .  pantoprazole (PROTONIX) 40 MG tablet, Take 1 tablet (40 mg total) by mouth 2 (two) times daily., Disp: 180 tablet, Rfl: 3 .  polyethylene glycol (MIRALAX / GLYCOLAX) packet, Take 17 g by mouth daily as needed., Disp: , Rfl:  .  promethazine (PHENERGAN) 25 MG tablet, Take 25 mg by mouth every 6 (six) hours as needed for nausea or vomiting., Disp: , Rfl:  .  cimetidine (TAGAMET) 400 MG tablet, Take 1 tablet (400 mg total) by mouth at bedtime. (Patient not taking: Reported on 04/07/2019), Disp: 30 tablet, Rfl: 5  Review of Systems  Constitutional: Negative.   HENT: Negative.   Respiratory: Negative.   Cardiovascular: Negative.   Endocrine: Negative.   Genitourinary: Negative.   Musculoskeletal: Negative.   Allergic/Immunologic: Negative.   Neurological: Negative.   Psychiatric/Behavioral: Negative.     Social History   Tobacco Use  . Smoking status: Never Smoker  . Smokeless tobacco: Never Used  Substance Use Topics  . Alcohol use: Yes    Alcohol/week: 1.0 - 2.0 standard drinks    Types: 1 - 2 Glasses of wine per week      Objective:   BP 130/70 (BP Location: Right Arm, Patient Position: Sitting, Cuff Size: Normal)   Pulse 70   Temp 97.6 F (36.4 C) (Oral)  Wt 133 lb (60.3 kg)   SpO2 100%   BMI 22.13 kg/m  Vitals:   04/07/19 0932  BP: 130/70  Pulse: 70  Temp: 97.6 F (36.4 C)  TempSrc: Oral  SpO2: 100%  Weight: 133 lb (60.3 kg)     Physical Exam Vitals signs reviewed.  Constitutional:      Appearance: She is well-developed.  HENT:     Head: Normocephalic and atraumatic.  Eyes:     General: No scleral icterus.    Conjunctiva/sclera: Conjunctivae normal.  Neck:     Thyroid: No thyromegaly.  Cardiovascular:     Rate and Rhythm: Normal rate and regular rhythm.     Heart sounds: Normal heart sounds.  Pulmonary:     Effort: Pulmonary effort is normal.     Breath sounds: Normal breath sounds.  Abdominal:     Palpations: Abdomen is soft.   Skin:    General: Skin is warm and dry.     Comments: Healing laceration of left ring finger.  Neurological:     General: No focal deficit present.     Mental Status: She is alert and oriented to person, place, and time.  Psychiatric:        Behavior: Behavior normal.        Thought Content: Thought content normal.        Judgment: Judgment normal.         Assessment & Plan     1. Essential hypertension Controlled on losartan,hydralazine. - CBC with Differential/Platelet - Comprehensive metabolic panel - TSH  2. Gastro-esophageal reflux disease without esophagitis Controlled with protonix/tagamet.  3. Laceration of left ring finger, foreign body presence unspecified, nail damage status unspecified, initial encounter Healing nicely - Td vaccine greater than or equal to 7yo preservative free IM  4. Anxiety Chronic,stable.   I, Porsha McClurkin CMA, am acting as a Education administrator for Wilhemena Durie., MD.       Wilhemena Durie, MD  [facility]

## 2019-04-08 ENCOUNTER — Telehealth: Payer: Self-pay

## 2019-04-08 LAB — CBC WITH DIFFERENTIAL/PLATELET
Basophils Absolute: 0 10*3/uL (ref 0.0–0.2)
Basos: 1 %
EOS (ABSOLUTE): 0.2 10*3/uL (ref 0.0–0.4)
Eos: 3 %
Hematocrit: 35.6 % (ref 34.0–46.6)
Hemoglobin: 12.5 g/dL (ref 11.1–15.9)
Immature Grans (Abs): 0 10*3/uL (ref 0.0–0.1)
Immature Granulocytes: 0 %
Lymphocytes Absolute: 1.3 10*3/uL (ref 0.7–3.1)
Lymphs: 24 %
MCH: 30.4 pg (ref 26.6–33.0)
MCHC: 35.1 g/dL (ref 31.5–35.7)
MCV: 87 fL (ref 79–97)
Monocytes Absolute: 0.4 10*3/uL (ref 0.1–0.9)
Monocytes: 7 %
Neutrophils Absolute: 3.6 10*3/uL (ref 1.4–7.0)
Neutrophils: 65 %
Platelets: 212 10*3/uL (ref 150–450)
RBC: 4.11 x10E6/uL (ref 3.77–5.28)
RDW: 12.3 % (ref 11.7–15.4)
WBC: 5.5 10*3/uL (ref 3.4–10.8)

## 2019-04-08 LAB — COMPREHENSIVE METABOLIC PANEL
ALT: 14 IU/L (ref 0–32)
AST: 26 IU/L (ref 0–40)
Albumin/Globulin Ratio: 2.1 (ref 1.2–2.2)
Albumin: 4.6 g/dL (ref 3.6–4.6)
Alkaline Phosphatase: 70 IU/L (ref 39–117)
BUN/Creatinine Ratio: 18 (ref 12–28)
BUN: 17 mg/dL (ref 8–27)
Bilirubin Total: 1.1 mg/dL (ref 0.0–1.2)
CO2: 22 mmol/L (ref 20–29)
Calcium: 9.9 mg/dL (ref 8.7–10.3)
Chloride: 102 mmol/L (ref 96–106)
Creatinine, Ser: 0.92 mg/dL (ref 0.57–1.00)
GFR calc Af Amer: 67 mL/min/{1.73_m2} (ref >59)
GFR calc non Af Amer: 58 mL/min/{1.73_m2} — ABNORMAL LOW (ref >59)
Globulin, Total: 2.2 g/dL (ref 1.5–4.5)
Glucose: 88 mg/dL (ref 65–99)
Potassium: 4.2 mmol/L (ref 3.5–5.2)
Sodium: 138 mmol/L (ref 134–144)
Total Protein: 6.8 g/dL (ref 6.0–8.5)

## 2019-04-08 LAB — TSH: TSH: 4.34 u[IU]/mL (ref 0.450–4.500)

## 2019-04-08 NOTE — Telephone Encounter (Signed)
-----   Message from Jerrol Banana., MD sent at 04/08/2019  1:34 PM EDT ----- Labs ok.

## 2019-04-08 NOTE — Telephone Encounter (Signed)
LMTCB 04/08/2019   Thanks,   -Mickel Baas

## 2019-04-09 NOTE — Telephone Encounter (Signed)
Pt advised of labs.

## 2019-05-20 ENCOUNTER — Other Ambulatory Visit: Payer: Self-pay | Admitting: Family Medicine

## 2019-05-20 DIAGNOSIS — K219 Gastro-esophageal reflux disease without esophagitis: Secondary | ICD-10-CM

## 2019-05-20 NOTE — Telephone Encounter (Signed)
Please review

## 2019-07-07 ENCOUNTER — Other Ambulatory Visit: Payer: Self-pay | Admitting: Family Medicine

## 2019-07-07 DIAGNOSIS — I10 Essential (primary) hypertension: Secondary | ICD-10-CM

## 2019-07-08 ENCOUNTER — Telehealth: Payer: Self-pay | Admitting: Family Medicine

## 2019-07-08 NOTE — Telephone Encounter (Signed)
Pt's husband called stated that losartan (COZAAR) 100 MG tablet is currently on back order & no local [facility] has the medication. Pt's husband is requesting an alternate medication be sent to [facility] for 30 day supply. He stated that [facility] advised [facility] that Dr. Rosanna Randy would select an alternative medication. Please advise. Thanks TNP

## 2019-07-08 NOTE — Telephone Encounter (Signed)
Telmesartan high dose daily

## 2019-07-08 NOTE — Telephone Encounter (Signed)
Please review chart and advise. KW 

## 2019-07-09 MED ORDER — TELMISARTAN 80 MG PO TABS: 80.0000 mg | ORAL_TABLET | Freq: Every day | ORAL | 1 refills | Status: DC

## 2019-07-09 NOTE — Telephone Encounter (Signed)
So I pulled up Telmisartan at 80mg , okay to give year refill or because this is temporary just one refill?KW

## 2019-07-09 NOTE — Telephone Encounter (Signed)
1 year thanks

## 2019-07-09 NOTE — Telephone Encounter (Signed)
[facility].  Thanks,   -Cuauhtemoc Huegel  

## 2019-07-13 ENCOUNTER — Telehealth: Payer: Self-pay

## 2019-07-13 NOTE — Telephone Encounter (Signed)
Encounter error

## 2019-09-02 ENCOUNTER — Ambulatory Visit (INDEPENDENT_AMBULATORY_CARE_PROVIDER_SITE_OTHER): Payer: Medicare Other

## 2019-09-02 ENCOUNTER — Other Ambulatory Visit: Payer: Self-pay

## 2019-09-02 DIAGNOSIS — Z23 Encounter for immunization: Secondary | ICD-10-CM

## 2019-10-07 NOTE — Progress Notes (Signed)
Patient: Bianca Malone, Female    DOB: 1935/06/25, 84 y.o.   MRN: [id number] Visit Date: 10/08/2019  Today's Provider: Wilhemena Durie, MD   Chief Complaint  Patient presents with  . Annual Exam   Subjective:     Annual wellness visit Bianca Malone is a 84 y.o. female. She feels well. She reports exercising 4 times a week.She is walking, doing yard work and riding her bike.  She reports she is sleeping well. She does not like feeling older as she has in the last few years.  She is now 26. ----------------------------------------------------------- She says her blood pressure at home runs 110s to 120s over 40s to 60s She says that she has had neck pain on and off since some recent yard work. She also has a history of mild cystocele.  Review of Systems  Constitutional: Negative.   HENT: Positive for hearing loss and tinnitus.   Eyes: Positive for discharge.  Respiratory: Positive for shortness of breath.   Cardiovascular: Negative.   Gastrointestinal: Negative.   Endocrine: Negative.   Genitourinary: Negative.   Musculoskeletal: Positive for neck pain and neck stiffness.  Skin: Negative.   Allergic/Immunologic: Negative.   Neurological: Positive for light-headedness.  Hematological: Negative.   Psychiatric/Behavioral: Negative.   All other systems reviewed and are negative.   Social History   Socioeconomic History  . Marital status: Married    Spouse name: Not on file  . Number of children: Not on file  . Years of education: Not on file  . Highest education level: Not on file  Occupational History  . Not on file  Social Needs  . Financial resource strain: Not on file  . Food insecurity    Worry: Not on file    Inability: Not on file  . Transportation needs    Medical: Not on file    Non-medical: Not on file  Tobacco Use  . Smoking status: Never Smoker  . Smokeless tobacco: Never Used  Substance and Sexual Activity  . Alcohol use: Yes    Alcohol/week:  1.0 - 2.0 standard drinks    Types: 1 - 2 Glasses of wine per week  . Drug use: No  . Sexual activity: Yes    Birth control/protection: Condom  Lifestyle  . Physical activity    Days per week: Not on file    Minutes per session: Not on file  . Stress: Not on file  Relationships  . Social Herbalist on phone: Not on file    Gets together: Not on file    Attends religious service: Not on file    Active member of club or organization: Not on file    Attends meetings of clubs or organizations: Not on file    Relationship status: Not on file  . Intimate partner violence    Fear of current or ex partner: Not on file    Emotionally abused: Not on file    Physically abused: Not on file    Forced sexual activity: Not on file  Other Topics Concern  . Not on file  Social History Narrative  . Not on file    Past Medical History:  Diagnosis Date  . Arthritis   . GERD (gastroesophageal reflux disease)   . Hypertension   . Vaginal prolapse      Patient Active Problem List   Diagnosis Date Noted  . HH (hiatus hernia) 01/22/2019  . Sensation of lump  in throat 01/22/2019  . SOB (shortness of breath) on exertion 07/18/2018  . Small bowel obstruction due to postoperative adhesions 02/26/2018  . Chest pain with low risk for cardiac etiology 05/13/2017  . Allergic rhinitis 08/19/2015  . Anxiety 08/19/2015  . SBO (small bowel obstruction) ([place]) 08/19/2015  . Arthritis 08/19/2015  . Osteoarthrosis 08/19/2015  . Gastroesophageal reflux disease without esophagitis 08/19/2015  . 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 Essential hypertension 08/19/2015  . Mitral valve disorder 08/19/2015  . Post menopausal syndrome 08/19/2015  . Thyroid nodule 08/19/2015    Past Surgical History:  Procedure Laterality Date  . ABDOMINAL ADHESION SURGERY     small bowel lysis of adhesions  . BREAST CYST ASPIRATION Left   . CARPAL TUNNEL RELEASE    . COLONOSCOPY    .  COLONOSCOPY WITH PROPOFOL N/A 07/22/2017   Procedure: COLONOSCOPY WITH PROPOFOL;  Surgeon: Manya Silvas, MD;  [facility];  Service: Endoscopy;  Laterality: N/A;  . CYST EXCISION     thyroglossal duct cyst surgery  . ESOPHAGOGASTRODUODENOSCOPY (EGD) WITH PROPOFOL N/A 07/22/2017   Procedure: ESOPHAGOGASTRODUODENOSCOPY (EGD) WITH PROPOFOL;  Surgeon: Manya Silvas, MD;  Location[facility] END[facility]COPY;  Service: Endoscopy;  Laterality: N/A;  . HYSTEROSCOPY    . INCISION AND DRAINAGE / EXCISION THYROGLOSSAL CYST    . UPPER GASTROINTESTINAL ENDOSCOPY      Her family history includes Alzheimer's disease in her mother; Anemia in her brother; Breast cancer in her paternal aunt; CAD in her father; Heart disease in her brother and brother; Hypertension in her father and mother; Kidney failure in her mother; Myelodysplastic syndrome in her brother; Prostate cancer in her paternal uncle.   Current Outpatient Medications:  .  aspirin 81 MG chewable tablet, Chew by mouth. , Disp: , Rfl:  .  CALCIUM CARBONATE-VITAMIN D PO, Take 1 tablet by mouth daily. Reported on 03/08/2016, Disp: , Rfl:  .  calcium-vitamin D (OSCAL WITH D) 500-200 MG-UNIT tablet, Take 1 tablet by mouth., Disp: , Rfl:  .  hydrALAZINE (APRESOLINE) 25 MG tablet, TAKE 1 TABLET BY MOUTH TWICE A DAY, Disp: 180 tablet, Rfl: 3 .  ibuprofen (ADVIL,MOTRIN) 600 MG tablet, Take 1 tablet (600 mg total) by mouth every 6 (six) hours as needed., Disp: 90 tablet, Rfl: 2 .  losartan (COZAAR) 100 MG tablet, TAKE 1 TABLET BY MOUTH EVERY DAY, Disp: 90 tablet, Rfl: 3 .  pantoprazole (PROTONIX) 40 MG tablet, TAKE 1 TABLET BY MOUTH EVERY DAY, Disp: 90 tablet, Rfl: 3 .  cimetidine (TAGAMET) 400 MG tablet, Take 1 tablet (400 mg total) by mouth at bedtime. (Patient not taking: Reported on 04/07/2019), Disp: 30 tablet, Rfl: 5 .  dicyclomine (BENTYL) 20 MG tablet, Take 20 mg by mouth every 6 (six) hours as needed for spasms., Disp: , Rfl:  .  polyethylene  glycol (MIRALAX / GLYCOLAX) packet, Take 17 g by mouth daily as needed., Disp: , Rfl:  .  promethazine (PHENERGAN) 25 MG tablet, Take 25 mg by mouth every 6 (six) hours as needed for nausea or vomiting., Disp: , Rfl:  .  telmisartan (MICARDIS) 80 MG tablet, Take 1 tablet (80 mg total) by mouth daily. (Patient not taking: Reported on 10/08/2019), Disp: 90 tablet, Rfl: 1  Patient Care Team: Jerrol Banana., MD as PCP - General (Family Medicine) Benjaman Kindler, MD as Consulting Physician (Obstetrics and Gynecology) Birder Robson, MD as Referring Physician (Ophthalmology) Murrell Redden, MD as Consulting Physician (Urology)  Beverly Gust, MD as Consulting Physician (Otolaryngology)    Objective:    Vitals: BP (!) 136/50 (BP Location: Left Arm, Cuff Size: Small)   Pulse 68   Temp (!) 96.9 F (36.1 C) (Temporal)   Resp 16   Ht 5\' 5"  (1.651 m)   Wt 136 lb 9.6 oz (62 kg)   SpO2 100%   BMI 22.73 kg/m   Physical Exam Vitals signs reviewed.  Constitutional:      Appearance: Normal appearance. She is well-developed.     Comments: Patient appears younger than her age of 59.  HENT:     Head: Normocephalic and atraumatic.     Right Ear: Tympanic membrane and external ear normal.     Left Ear: Tympanic membrane and external ear normal.  Eyes:     General: No scleral icterus.    Conjunctiva/sclera: Conjunctivae normal.  Neck:     Thyroid: No thyromegaly.     Vascular: No carotid bruit.  Cardiovascular:     Rate and Rhythm: Normal rate and regular rhythm.     Heart sounds: Normal heart sounds.  Pulmonary:     Effort: Pulmonary effort is normal.     Breath sounds: Normal breath sounds.  Abdominal:     Palpations: Abdomen is soft.  Musculoskeletal:     Right lower leg: No edema.     Left lower leg: No edema.  Lymphadenopathy:     Cervical: No cervical adenopathy.  Skin:    General: Skin is warm and dry.  Neurological:     General: No focal deficit present.     Mental  Status: She is alert and oriented to person, place, and time.  Psychiatric:        Mood and Affect: Mood normal.        Behavior: Behavior normal.        Thought Content: Thought content normal.        Judgment: Judgment normal.     Activities of Daily Living In your present state of health, do you have any difficulty performing the following activities: 10/08/2019  Hearing? Y  Vision? N  Difficulty concentrating or making decisions? N  Walking or climbing stairs? N  Dressing or bathing? N  Doing errands, shopping? N  Some recent data might be hidden    Fall Risk Assessment Fall Risk  10/08/2019 01/15/2018 11/29/2016 05/21/2016 08/22/2015  Falls in the past year? 0 No No No No  Number falls in past yr: 0 - - - -  Injury with Fall? 0 - - - -  Follow up Falls evaluation completed - - - -     Depression Screen PHQ 2/9 Scores 10/08/2019 01/15/2018 01/15/2018 11/29/2016  PHQ - 2 Score 1 0 0 0  PHQ- 9 Score 1 0 - -    6CIT Screen 11/29/2016  What Year? 0 points  What month? 0 points  What time? 0 points  Count back from 20 0 points  Months in reverse 0 points  Repeat phrase 2 points  Total Score 2      Assessment & Plan:     Annual Wellness Visit  Reviewed patient's Family Medical History Reviewed and updated list of patient's medical providers Assessment of cognitive impairment was done Assessed patient's functional ability Established a written schedule for health screening [place] Completed and Reviewed  Exercise Activities and Dietary recommendations Goals    . Increase water intake     Starting 11/29/16, I will increase my  water intake to 5-6 glasses a day.       Immunization History  Administered Date(s) Administered  . Fluad Quad(high Malone 65+) 09/02/2019  . Influenza Split 11/04/2012  . Influenza, High Malone Seasonal PF 11/16/2014, 08/22/2015, 09/29/2016, 09/05/2017, 09/16/2018  . Influenza,inj,Quad PF,6+ Mos 10/14/2013  .  Pneumococcal Conjugate-13 11/16/2014  . Pneumococcal Polysaccharide-23 03/13/2011  . Td 03/30/2004, 04/07/2019    Health Maintenance  Topic Date Due  . TETANUS/TDAP  04/06/2029  . INFLUENZA VACCINE  Completed  . DEXA SCAN  Completed  . PNA vac Low Risk Adult  Completed     Discussed health benefits of physical activity, and encouraged her to engage in regular exercise appropriate for her age and condition.   1. Encounter for annual physical exam   2. Essential hypertension Restart losartan at low-Malone. - CBC with Differential/Platelet - Comprehensive metabolic panel  3. Thyroid nodule No nodule on exam today. - TSH  4. Screening for lipid disorders  - Lipid panel  5. Gastro-esophageal reflux disease without esophagitis Has been followed by GI to this point.  Dr. Vira Agar recently retired.  6. Anxiety Chronic issue.  Presently stable.  --------------------------------------------------------------------    Wilhemena Durie, MD  [facility]

## 2019-10-07 NOTE — Progress Notes (Deleted)
Patient: Bianca Malone Female    DOB: 1935/11/13   84 y.o.   MRN: [id number] Visit Date: 10/07/2019  Today's Provider: Wilhemena Durie, MD   No chief complaint on file.  Subjective:     Diabetes Mellitus Type II, Follow-up:   Lab Results  Component Value Date   HGBA1C 5.1 05/21/2016      ------------------------------------------------------------------------   Hypertension, follow-up:  BP Readings from Last 3 Encounters:  04/07/19 130/70  10/07/18 (!) 140/58  09/16/18 (!) 184/65    She was last seen for hypertension 6 months ago.  BP at that visit was 130/70. Management since that visit includes no changes.She reports {excellent/good/fair/poor:19665} compliance with treatment. She {ACTION; IS/IS [id number] having side effects. *** She {is/is not:9024} exercising. She {is/is not:9024} adherent to low salt diet.   Outside blood pressures are ***. She is experiencing {Symptoms; cardiac:12860}.  Patient denies {Symptoms; cardiac:12860}.   Cardiovascular risk factors include {cv risk factors:510}.  Use of agents associated with hypertension: {bp agents assoc with hypertension:511::"none"}.   ------------------------------------------------------------------------    Lipid/Cholesterol, Follow-up:   Last Lipid Panel drawn 01/23/2018 Management since that visit includes no Changes.  Last Lipid Panel:    Component Value Date/Time   CHOL 175 01/23/2018 0913   TRIG 47 01/23/2018 0913   HDL 84 01/23/2018 0913   CHOLHDL 2.1 01/23/2018 0913   LDLCALC 82 01/23/2018 0913    Wt Readings from Last 3 Encounters:  04/07/19 133 lb (60.3 kg)  10/07/18 134 lb (60.8 kg)  09/16/18 138 lb (62.6 kg)    ------------------------------------------------------------------------  HPI  Allergies  Allergen Reactions  . Loratadine   . Amoxicillin Hives and Rash       . Daypro  [Oxaprozin] Rash  . Sulfa Antibiotics Rash    presuptive reaction     Current  Outpatient Medications:  .  aspirin 81 MG chewable tablet, Chew by mouth. , Disp: , Rfl:  .  CALCIUM CARBONATE-VITAMIN D PO, Take 1 tablet by mouth daily. Reported on 03/08/2016, Disp: , Rfl:  .  cimetidine (TAGAMET) 400 MG tablet, Take 1 tablet (400 mg total) by mouth at bedtime. (Patient not taking: Reported on 04/07/2019), Disp: 30 tablet, Rfl: 5 .  dicyclomine (BENTYL) 20 MG tablet, Take 20 mg by mouth every 6 (six) hours as needed for spasms., Disp: , Rfl:  .  hydrALAZINE (APRESOLINE) 25 MG tablet, TAKE 1 TABLET BY MOUTH TWICE A DAY, Disp: 180 tablet, Rfl: 3 .  ibuprofen (ADVIL,MOTRIN) 600 MG tablet, Take 1 tablet (600 mg total) by mouth every 6 (six) hours as needed., Disp: 90 tablet, Rfl: 2 .  losartan (COZAAR) 100 MG tablet, TAKE 1 TABLET BY MOUTH EVERY DAY, Disp: 90 tablet, Rfl: 3 .  pantoprazole (PROTONIX) 40 MG tablet, TAKE 1 TABLET BY MOUTH EVERY DAY, Disp: 90 tablet, Rfl: 3 .  polyethylene glycol (MIRALAX / GLYCOLAX) packet, Take 17 g by mouth daily as needed., Disp: , Rfl:  .  promethazine (PHENERGAN) 25 MG tablet, Take 25 mg by mouth every 6 (six) hours as needed for nausea or vomiting., Disp: , Rfl:  .  telmisartan (MICARDIS) 80 MG tablet, Take 1 tablet (80 mg total) by mouth daily., Disp: 90 tablet, Rfl: 1  Review of Systems  Constitutional: Negative.   HENT: Positive for tinnitus.   Eyes: Positive for discharge.  Respiratory: Positive for shortness of breath.   Cardiovascular: Negative.   Gastrointestinal: Negative.   Endocrine: Negative.   Genitourinary: Negative.  Musculoskeletal: Positive for neck pain and neck stiffness.  Skin: Negative.   Allergic/Immunologic: Negative.   Neurological: Positive for light-headedness.  Hematological: Negative.   Psychiatric/Behavioral: Negative.   All other systems reviewed and are negative.   Social History   Tobacco Use  . Smoking status: Never Smoker  . Smokeless tobacco: Never Used  Substance Use Topics  . Alcohol use: Yes     Alcohol/week: 1.0 - 2.0 standard drinks    Types: 1 - 2 Glasses of wine per week      Objective:   There were no vitals taken for this visit. There were no vitals filed for this visit.There is no height or weight on file to calculate BMI.   Physical Exam   No results found for any visits on 10/08/19.     Assessment & Plan     Annual Wellness Visit  Reviewed patient's Family Medical History Reviewed and updated list of patient's medical providers Assessment of cognitive impairment was done Assessed patient's functional ability Established a written schedule for health screening [place] Completed and Reviewed  Exercise Activities and Dietary recommendations       Goals          . Increase water intake      Starting 11/29/16, I will increase my water intake to 5-6 glasses a day.         Richard Cranford Mon, MD  [facility]

## 2019-10-08 ENCOUNTER — Other Ambulatory Visit: Payer: Self-pay

## 2019-10-08 ENCOUNTER — Encounter: Payer: Self-pay | Admitting: Family Medicine

## 2019-10-08 ENCOUNTER — Ambulatory Visit (INDEPENDENT_AMBULATORY_CARE_PROVIDER_SITE_OTHER): Payer: Medicare Other | Admitting: Family Medicine

## 2019-10-08 VITALS — BP 136/50 | HR 68 | Temp 96.9°F | Resp 16 | Ht 65.0 in | Wt 136.6 lb

## 2019-10-08 DIAGNOSIS — K219 Gastro-esophageal reflux disease without esophagitis: Secondary | ICD-10-CM | POA: Diagnosis not present

## 2019-10-08 DIAGNOSIS — F419 Anxiety disorder, unspecified: Secondary | ICD-10-CM

## 2019-10-08 DIAGNOSIS — Z0000 Encounter for general adult medical examination without abnormal findings: Secondary | ICD-10-CM

## 2019-10-08 DIAGNOSIS — E041 Nontoxic single thyroid nodule: Secondary | ICD-10-CM | POA: Diagnosis not present

## 2019-10-08 DIAGNOSIS — Z13220 Encounter for screening for lipoid disorders: Secondary | ICD-10-CM

## 2019-10-08 DIAGNOSIS — I10 Essential (primary) hypertension: Secondary | ICD-10-CM

## 2019-10-14 LAB — CBC WITH DIFFERENTIAL/PLATELET
Basophils Absolute: 0 10*3/uL (ref 0.0–0.2)
Basos: 1 %
EOS (ABSOLUTE): 0.1 10*3/uL (ref 0.0–0.4)
Eos: 3 %
Hematocrit: 35.6 % (ref 34.0–46.6)
Hemoglobin: 12.4 g/dL (ref 11.1–15.9)
Immature Grans (Abs): 0 10*3/uL (ref 0.0–0.1)
Immature Granulocytes: 0 %
Lymphocytes Absolute: 1.1 10*3/uL (ref 0.7–3.1)
Lymphs: 24 %
MCH: 30.9 pg (ref 26.6–33.0)
MCHC: 34.8 g/dL (ref 31.5–35.7)
MCV: 89 fL (ref 79–97)
Monocytes Absolute: 0.5 10*3/uL (ref 0.1–0.9)
Monocytes: 10 %
Neutrophils Absolute: 3 10*3/uL (ref 1.4–7.0)
Neutrophils: 62 %
Platelets: 234 10*3/uL (ref 150–450)
RBC: 4.01 x10E6/uL (ref 3.77–5.28)
RDW: 12.6 % (ref 11.7–15.4)
WBC: 4.7 10*3/uL (ref 3.4–10.8)

## 2019-10-14 LAB — COMPREHENSIVE METABOLIC PANEL
ALT: 13 IU/L (ref 0–32)
AST: 22 IU/L (ref 0–40)
Albumin/Globulin Ratio: 1.9 (ref 1.2–2.2)
Albumin: 4.6 g/dL (ref 3.6–4.6)
Alkaline Phosphatase: 71 IU/L (ref 39–117)
BUN/Creatinine Ratio: 21 (ref 12–28)
BUN: 19 mg/dL (ref 8–27)
Bilirubin Total: 0.7 mg/dL (ref 0.0–1.2)
CO2: 21 mmol/L (ref 20–29)
Calcium: 9.9 mg/dL (ref 8.7–10.3)
Chloride: 100 mmol/L (ref 96–106)
Creatinine, Ser: 0.89 mg/dL (ref 0.57–1.00)
GFR calc Af Amer: 70 mL/min/{1.73_m2} (ref >59)
GFR calc non Af Amer: 61 mL/min/{1.73_m2} (ref >59)
Globulin, Total: 2.4 g/dL (ref 1.5–4.5)
Glucose: 82 mg/dL (ref 65–99)
Potassium: 4.6 mmol/L (ref 3.5–5.2)
Sodium: 138 mmol/L (ref 134–144)
Total Protein: 7 g/dL (ref 6.0–8.5)

## 2019-10-14 LAB — LIPID PANEL
Chol/HDL Ratio: 2.2 ratio (ref 0.0–4.4)
Cholesterol, Total: 194 mg/dL (ref 100–199)
HDL: 90 mg/dL (ref >39)
LDL Chol Calc (NIH): 90 mg/dL (ref 0–99)
Triglycerides: 76 mg/dL (ref 0–149)
VLDL Cholesterol Cal: 14 mg/dL (ref 5–40)

## 2019-10-14 LAB — TSH: TSH: 4.49 u[IU]/mL (ref 0.450–4.500)

## 2019-10-16 ENCOUNTER — Telehealth: Payer: Self-pay

## 2019-10-16 NOTE — Telephone Encounter (Signed)
LM about labs being ok and for her to call with any questions.

## 2019-10-16 NOTE — Telephone Encounter (Signed)
-----   Message from Jerrol Banana., MD sent at 10/16/2019  8:42 AM EST ----- Labs good.

## 2019-12-01 NOTE — Progress Notes (Signed)
Patient: Bianca Malone Female    DOB: 1935/04/11   84 y.o.   MRN: [id number] Visit Date: 12/02/2019  Today's Provider: Wilhemena Durie, MD   Chief Complaint  Patient presents with  . Cough   Subjective:    Virtual Visit via Telephone Note  I connected with Bianca Malone on 12/02/19 at  1:40 PM EST by telephone and verified that I am speaking with the correct person using two identifiers.  Location: Patient: Home Provider: Office   I discussed the limitations, risks, security and privacy concerns of performing an evaluation and management service by telephone and the availability of in person appointments. I also discussed with the patient that there may be a patient responsible charge related to this service. The patient expressed understanding and agreed to proceed.    Cough This is a new problem. The current episode started in the past 7 days. The problem has been unchanged. The cough is non-productive. Associated symptoms include nasal congestion, postnasal drip and wheezing. Pertinent negatives include no chest pain, chills, ear congestion, ear pain, fever, headaches, rhinorrhea, sore throat or shortness of breath. Nothing aggravates the symptoms. She has tried OTC cough suppressant for the symptoms. The treatment provided no relief.  Cough for 8 days.Improving. No fever. No shortness of breath, no myalgias, no headache. Patient is a good health and 29 but her husband is in poor health.  He is asymptomatic.  Covid pandemic ongoing Allergies  Allergen Reactions  . Amoxicillin Hives and Rash       . Daypro  [Oxaprozin] Rash  . Sulfa Antibiotics Rash    presuptive reaction     Current Outpatient Medications:  .  hydrALAZINE (APRESOLINE) 25 MG tablet, TAKE 1 TABLET BY MOUTH TWICE A DAY, Disp: 180 tablet, Rfl: 3 .  losartan (COZAAR) 100 MG tablet, TAKE 1 TABLET BY MOUTH EVERY DAY, Disp: 90 tablet, Rfl: 3 .  pantoprazole (PROTONIX) 40 MG tablet, TAKE 1 TABLET BY MOUTH  EVERY DAY, Disp: 90 tablet, Rfl: 3 .  aspirin 81 MG chewable tablet, Chew by mouth. , Disp: , Rfl:  .  CALCIUM CARBONATE-VITAMIN D PO, Take 1 tablet by mouth daily. Reported on 03/08/2016, Disp: , Rfl:  .  calcium-vitamin D (OSCAL WITH D) 500-200 MG-UNIT tablet, Take 1 tablet by mouth., Disp: , Rfl:  .  dicyclomine (BENTYL) 20 MG tablet, Take 20 mg by mouth every 6 (six) hours as needed for spasms., Disp: , Rfl:  .  ibuprofen (ADVIL,MOTRIN) 600 MG tablet, Take 1 tablet (600 mg total) by mouth every 6 (six) hours as needed., Disp: 90 tablet, Rfl: 2 .  polyethylene glycol (MIRALAX / GLYCOLAX) packet, Take 17 g by mouth daily as needed., Disp: , Rfl:  .  promethazine (PHENERGAN) 25 MG tablet, Take 25 mg by mouth every 6 (six) hours as needed for nausea or vomiting., Disp: , Rfl:   Review of Systems  Constitutional: Negative for chills and fever.  HENT: Positive for postnasal drip. Negative for ear pain, rhinorrhea and sore throat.   Respiratory: Positive for cough and wheezing. Negative for shortness of breath.   Cardiovascular: Negative for chest pain.  Neurological: Negative for headaches.    Social History   Tobacco Use  . Smoking status: Never Smoker  . Smokeless tobacco: Never Used  Substance Use Topics  . Alcohol use: Yes    Alcohol/week: 1.0 - 2.0 standard drinks    Types: 1 - 2 Glasses of wine  per week      Objective:   There were no vitals taken for this visit. There were no vitals filed for this visit.There is no height or weight on file to calculate BMI.   Physical Exam   No results found for any visits on 12/02/19.     Assessment & Plan     1. Cough  - Novel Coronavirus, NAA ([facility])  2. Upper respiratory tract infection, unspecified type Check covid.fluids,robitussin. - Novel Coronavirus, NAA ([facility])  I discussed the assessment and treatment plan with the patient. The patient was provided an opportunity to ask questions and all were answered. The patient  agreed with the plan and demonstrated an understanding of the instructions.   The patient was advised to call back or seek an in-person evaluation if the symptoms worsen or if the condition fails to improve as anticipated.  I provided 15 minutes of non-face-to-face time during this encounter.     Richard Cranford Mon, MD  [facility]

## 2019-12-02 ENCOUNTER — Ambulatory Visit (INDEPENDENT_AMBULATORY_CARE_PROVIDER_SITE_OTHER): Payer: Medicare Other | Admitting: Family Medicine

## 2019-12-02 DIAGNOSIS — R059 Cough, unspecified: Secondary | ICD-10-CM

## 2019-12-02 DIAGNOSIS — J069 Acute upper respiratory infection, unspecified: Secondary | ICD-10-CM

## 2019-12-02 DIAGNOSIS — R05 Cough: Secondary | ICD-10-CM | POA: Diagnosis not present

## 2019-12-03 ENCOUNTER — Ambulatory Visit: Payer: Medicare Other | Attending: Internal Medicine

## 2019-12-03 DIAGNOSIS — Z20822 Contact with and (suspected) exposure to covid-19: Secondary | ICD-10-CM

## 2019-12-06 ENCOUNTER — Telehealth: Payer: Self-pay

## 2019-12-06 NOTE — Telephone Encounter (Signed)
Received call from patient checking Covid results.  Advised no results at this time.   

## 2019-12-08 ENCOUNTER — Ambulatory Visit: Payer: Medicare Other | Attending: Internal Medicine

## 2019-12-08 DIAGNOSIS — Z20822 Contact with and (suspected) exposure to covid-19: Secondary | ICD-10-CM

## 2019-12-09 LAB — NOVEL CORONAVIRUS, NAA

## 2019-12-10 LAB — NOVEL CORONAVIRUS, NAA: SARS-CoV-2, NAA: DETECTED — AB

## 2019-12-31 ENCOUNTER — Other Ambulatory Visit: Payer: Self-pay | Admitting: Family Medicine

## 2019-12-31 DIAGNOSIS — Z1231 Encounter for screening mammogram for malignant neoplasm of breast: Secondary | ICD-10-CM

## 2020-01-04 DIAGNOSIS — U071 COVID-19: Secondary | ICD-10-CM

## 2020-01-04 HISTORY — DX: COVID-19: U07.1

## 2020-01-06 ENCOUNTER — Telehealth: Payer: Self-pay

## 2020-01-06 NOTE — Telephone Encounter (Signed)
Copied from [place] [id number]. Topic: General - Other >> Jan 06, 2020  3:24 PM Rainey Pines A wrote: Patient is requesting a callback from Dr. Marlan Palau nurse in regards to her  getting the 2nd dose of covid vaccine. Please advise

## 2020-01-07 ENCOUNTER — Ambulatory Visit: Payer: Self-pay

## 2020-01-07 NOTE — Telephone Encounter (Signed)
Patient called staitng that she has severe abdominal pain.  She states it just shoots across her abdomin at her navel height. She states her abdomin is sore to touch. She has not been able to sleep.  She had pain like this one time and needed surgery. She has had BM today soft normal.  She feels nauseated and has been vomiting. She denies that the pain travels but dose states she had heart burn and relieved it with a tum. Per protocol patient will go to ER for evaluation of her symptoms. Care advice was read to patient.  She verbalized understanding.  Reason for Disposition . [1] SEVERE pain AND [2] age > 36  Answer Assessment - Initial Assessment Questions 1. LOCATION: "Where does it hurt?"      Across the navel 2. RADIATION: "Does the pain shoot anywhere else?" (e.g., chest, back)     no 3. ONSET: "When did the pain begin?" (e.g., minutes, hours or days ago)      Monday 4. SUDDEN: "Gradual or sudden onset?"     sudden 5. PATTERN "Does the pain come and go, or is it constant?"    - If constant: "Is it getting better, staying the same, or worsening?"      (Note: Constant means the pain never goes away completely; most serious pain is constant and it progresses)     - If intermittent: "How long does it last?" "Do you have pain now?"     (Note: Intermittent means the pain goes away completely between bouts)     cramps 6. SEVERITY: "How bad is the pain?"  (e.g., Scale 1-10; mild, moderate, or severe)   - MILD (1-3): doesn't interfere with normal activities, abdomen soft and not tender to touch    - MODERATE (4-7): interferes with normal activities or awakens from sleep, tender to touch    - SEVERE (8-10): excruciating pain, doubled over, unable to do any normal activities     severe 7. RECURRENT SYMPTOM: "Have you ever had this type of abdominal pain before?" If so, ask: "When was the last time?" and "What happened that time?"    Yes, got medication for cramping 8. CAUSE: "What do you think  is causing the abdominal pain?"     No surgery 2001 blockage 9. RELIEVING/AGGRAVATING FACTORS: "What makes it better or worse?" (e.g., movement, antacids, bowel movement)    Vomiting but not this time Has BM today soft normal amount 10. OTHER SYMPTOMS: "Has there been any vomiting, diarrhea, constipation, or urine problems?"      vomiting 11. PREGNANCY: "Is there any chance you are pregnant?" "When was your last menstrual period?"      N/A  Protocols used: ABDOMINAL PAIN - [facility]

## 2020-01-07 NOTE — Telephone Encounter (Signed)
Fyi.

## 2020-01-07 NOTE — Telephone Encounter (Signed)
FYI! Patient wanted to let Dr. Rosanna Randy know she had a reaction to the first covid vaccine. Patient states she had elevated blood pressure (several hours after getting vaccine), headache, and redness around injection site. Patient is due to get her second vaccine 01/12/2020.

## 2020-02-12 DIAGNOSIS — U071 COVID-19: Secondary | ICD-10-CM | POA: Insufficient documentation

## 2020-02-15 ENCOUNTER — Other Ambulatory Visit: Payer: Self-pay | Admitting: Family Medicine

## 2020-02-15 ENCOUNTER — Ambulatory Visit
Admission: RE | Admit: 2020-02-15 | Discharge: 2020-02-15 | Disposition: A | Discharge status: Home / Self Care | Payer: Medicare Other | Source: Ambulatory Visit | Attending: Family Medicine | Admitting: Family Medicine

## 2020-02-15 DIAGNOSIS — N6320 Unspecified lump in the left breast, unspecified quadrant: Secondary | ICD-10-CM

## 2020-02-15 DIAGNOSIS — R928 Other abnormal and inconclusive findings on diagnostic imaging of breast: Secondary | ICD-10-CM

## 2020-02-15 DIAGNOSIS — Z1231 Encounter for screening mammogram for malignant neoplasm of breast: Secondary | ICD-10-CM | POA: Insufficient documentation

## 2020-02-25 ENCOUNTER — Ambulatory Visit (INDEPENDENT_AMBULATORY_CARE_PROVIDER_SITE_OTHER): Payer: Medicare Other | Admitting: Dermatology

## 2020-02-25 ENCOUNTER — Other Ambulatory Visit: Payer: Self-pay

## 2020-02-25 DIAGNOSIS — L720 Epidermal cyst: Secondary | ICD-10-CM | POA: Diagnosis not present

## 2020-02-25 DIAGNOSIS — L03111 Cellulitis of right axilla: Secondary | ICD-10-CM

## 2020-02-25 MED ORDER — MUPIROCIN 2 % EX OINT: TOPICAL_OINTMENT | CUTANEOUS | 0 refills | Status: DC

## 2020-02-25 MED ORDER — DOXYCYCLINE HYCLATE 100 MG PO CAPS: 100.0000 mg | ORAL_CAPSULE | Freq: Two times a day (BID) | ORAL | 0 refills | Status: AC

## 2020-02-25 NOTE — Progress Notes (Signed)
   Follow-Up Visit   Subjective  Bianca Malone is a 85 y.o. female who presents for the following: Skin Problem (growth on the R upper arm x 1 week oozing ).    The following portions of the chart were reviewed this encounter and updated as appropriate:     Review of Systems: No other skin or systemic complaints.  Objective  Well appearing patient in no apparent distress; mood and affect are within normal limits.  A focused examination was performed including R axilla . Relevant physical exam findings are noted in the Assessment and Plan.  Objective  Right Axilla: Subcutaneous papule/nodule with erythema and edema, tender to touch.   Assessment & Plan  Epidermal cyst with inflammation/cellulitis and rupture Right Axilla With inflammation rupture and drainage and cellulitis. Incision and Drainage - Right Axilla Location: R axilla   Informed Consent: Discussed risks (permanent scarring, light or dark discoloration, infection, pain, bleeding, bruising, redness, damage to adjacent structures, and recurrence of the lesion) and benefits of the procedure, as well as the alternatives.  Informed consent was obtained.  Preparation: The area was prepped with alcohol.  Anesthesia: Lidocaine 1% without epinephrine  Procedure Details: An incision was made overlying the lesion. The lesion drained blood and keratin.  The lesion was treated with curettage and irrigation. A large amount of fluid and keratin was drained.    Antibiotic ointment and a sterile pressure dressing were applied. The patient tolerated procedure well.  Total number of lesions drained: 1  Plan: The patient was instructed on post-op care. Recommend OTC analgesia as needed for pain.   doxycycline (VIBRAMYCIN) 100 MG capsule - Right Axilla  mupirocin ointment (BACTROBAN) 2 % - Right Axilla  Return in about 2 months (around 04/26/2020) for TBSE.   IMarye Round, CMA, am acting as scribe for Sarina Ser, MD .

## 2020-02-29 ENCOUNTER — Ambulatory Visit
Admission: RE | Admit: 2020-02-29 | Discharge: 2020-02-29 | Disposition: A | Discharge status: Home / Self Care | Payer: Medicare Other | Source: Ambulatory Visit | Attending: Family Medicine | Admitting: Family Medicine

## 2020-02-29 DIAGNOSIS — N6320 Unspecified lump in the left breast, unspecified quadrant: Secondary | ICD-10-CM | POA: Insufficient documentation

## 2020-02-29 DIAGNOSIS — R928 Other abnormal and inconclusive findings on diagnostic imaging of breast: Secondary | ICD-10-CM

## 2020-03-03 ENCOUNTER — Ambulatory Visit: Payer: Medicare Other | Admitting: Dermatology

## 2020-03-14 ENCOUNTER — Other Ambulatory Visit: Payer: Self-pay | Admitting: Family Medicine

## 2020-03-14 DIAGNOSIS — I10 Essential (primary) hypertension: Secondary | ICD-10-CM

## 2020-04-01 NOTE — Progress Notes (Signed)
Trena Platt Cummings,acting as a scribe for Wilhemena Durie, MD.,have documented all relevant documentation on the behalf of Wilhemena Durie, MD,as directed by  Wilhemena Durie, MD while in the presence of Wilhemena Durie, MD.  Established patient visit   Patient: Bianca Malone   DOB: [birth year]   85 y.o. Female  MRN: [id number] Visit Date: 04/07/2020  Today's healthcare provider: Wilhemena Durie, MD   Chief Complaint  Patient presents with  . Hypertension   Subjective    HPI Patient concerned about husband with failing health at home.  She is alone caregiver.  She did have Covid at the end of last year and was likely only mildly ill.  Her IBS continues to be a problem. Hypertension, follow-up  BP Readings from Last 3 Encounters:  04/07/20 (!) 174/67  10/08/19 (!) 136/50  04/07/19 130/70   She was last seen for hypertension 6 months ago.  BP at that visit was 136/50.      Management since that visit includes; Restarted losartan at low-dose. She reports excellent compliance with treatment. She is not having side effects.  She is exercising. She sometimes adherent to low salt diet.   Outside blood pressures are not checking.  She does not smoke.  -----------------------------------------------------------------------------------------  Anxiety From 10/08/2019-Chronic issue. Presently stable.       Medications: Outpatient Medications Prior to Visit  Medication Sig  . aspirin 81 MG chewable tablet Chew by mouth.   Marland Kitchen CALCIUM CARBONATE-VITAMIN D PO Take 1 tablet by mouth daily. Reported on 03/08/2016  . calcium-vitamin D (OSCAL WITH D) 500-200 MG-UNIT tablet Take 1 tablet by mouth.  . dicyclomine (BENTYL) 20 MG tablet Take 20 mg by mouth every 6 (six) hours as needed for spasms.  . hydrALAZINE (APRESOLINE) 25 MG tablet TAKE 1 TABLET BY MOUTH TWICE A DAY  . ibuprofen (ADVIL,MOTRIN) 600 MG tablet Take 1 tablet (600 mg total) by mouth every 6 (six) hours as  needed.  Marland Kitchen losartan (COZAAR) 100 MG tablet TAKE 1 TABLET BY MOUTH EVERY DAY  . pantoprazole (PROTONIX) 40 MG tablet TAKE 1 TABLET BY MOUTH EVERY DAY  . promethazine (PHENERGAN) 25 MG tablet Take 25 mg by mouth every 6 (six) hours as needed for nausea or vomiting.  . mupirocin ointment (BACTROBAN) 2 % Apply to skin at each bandage change (Patient not taking: Reported on 04/07/2020)  . polyethylene glycol (MIRALAX / GLYCOLAX) packet Take 17 g by mouth daily as needed.   No facility-administered medications prior to visit.    Review of Systems  Constitutional: Negative for appetite change, chills, fatigue and fever.  HENT: Negative.   Respiratory: Negative for chest tightness and shortness of breath.   Cardiovascular: Negative for chest pain and palpitations.  Gastrointestinal: Negative for abdominal pain, nausea and vomiting.       Chronic IBS symptoms  Endocrine: Negative.   Genitourinary: Negative.   Allergic/Immunologic: Negative.   Neurological: Negative for dizziness and weakness.  Hematological: Negative.   Psychiatric/Behavioral: The patient is nervous/anxious.        Objective    BP (!) 174/67 (BP Location: Right Arm, Patient Position: Sitting, Cuff Size: Normal)   Pulse 64   Temp (!) 96.9 F (36.1 C) (Temporal)   Wt 136 lb 6.4 oz (61.9 kg)   BMI 22.70 kg/m  Wt Readings from Last 3 Encounters:  04/07/20 136 lb 6.4 oz (61.9 kg)  10/08/19 136 lb 9.6 oz (62 kg)  04/07/19 133 lb (  60.3 kg)      Physical Exam Vitals reviewed.  Constitutional:      Appearance: She is well-developed.  HENT:     Head: Normocephalic and atraumatic.  Eyes:     General: No scleral icterus.    Conjunctiva/sclera: Conjunctivae normal.  Neck:     Thyroid: No thyromegaly.  Cardiovascular:     Rate and Rhythm: Normal rate and regular rhythm.     Heart sounds: Normal heart sounds.  Pulmonary:     Effort: Pulmonary effort is normal.     Breath sounds: Normal breath sounds.  Abdominal:      Palpations: Abdomen is soft.  Musculoskeletal:     Right lower leg: No edema.     Left lower leg: No edema.  Skin:    General: Skin is warm and dry.  Neurological:     General: No focal deficit present.     Mental Status: She is alert and oriented to person, place, and time.  Psychiatric:        Mood and Affect: Mood normal.        Behavior: Behavior normal.        Thought Content: Thought content normal.        Judgment: Judgment normal.       No results found for any visits on 04/07/20.  Assessment & Plan     1. Irritable bowel syndrome, unspecified type We will refill the Bentyl as opposed to trying Librax for now.  Contraindicated in her age.  On the beers list - promethazine (PHENERGAN) 25 MG tablet; Take 1 tablet (25 mg total) by mouth every 6 (six) hours as needed for nausea or vomiting.  Dispense: 30 tablet; Refill: 3 - clidinium-chlordiazePOXIDE (LIBRAX) 5-2.5 MG capsule; Take 1 capsule by mouth 2 (two) times daily as needed.  Dispense: 60 capsule; Refill: 3  2. Essential hypertension Controlled on losartan and hydralazine  3. Adaptive colitis   4. Gastroesophageal reflux disease without esophagitis On pantoprazole  5. Anxiety Chronic issue.  Consider low-dose SSRI in the future.   No follow-ups on file.      I, Wilhemena Durie, MD, have reviewed all documentation for this visit. The documentation on 04/12/20 for the exam, diagnosis, procedures, and orders are all accurate and complete.    Shauntia Levengood Cranford Mon, MD  [facility] [place] [phone] (phone) [phone] (fax)  [place]

## 2020-04-07 ENCOUNTER — Encounter: Payer: Self-pay | Admitting: Family Medicine

## 2020-04-07 ENCOUNTER — Ambulatory Visit (INDEPENDENT_AMBULATORY_CARE_PROVIDER_SITE_OTHER): Payer: Medicare Other | Admitting: Family Medicine

## 2020-04-07 ENCOUNTER — Telehealth: Payer: Self-pay | Admitting: Family Medicine

## 2020-04-07 ENCOUNTER — Other Ambulatory Visit: Payer: Self-pay

## 2020-04-07 VITALS — BP 174/67 | HR 64 | Temp 96.9°F | Wt 136.4 lb

## 2020-04-07 DIAGNOSIS — K589 Irritable bowel syndrome without diarrhea: Secondary | ICD-10-CM | POA: Diagnosis not present

## 2020-04-07 DIAGNOSIS — I10 Essential (primary) hypertension: Secondary | ICD-10-CM | POA: Diagnosis not present

## 2020-04-07 DIAGNOSIS — F419 Anxiety disorder, unspecified: Secondary | ICD-10-CM | POA: Diagnosis not present

## 2020-04-07 DIAGNOSIS — K219 Gastro-esophageal reflux disease without esophagitis: Secondary | ICD-10-CM | POA: Diagnosis not present

## 2020-04-07 MED ORDER — PROMETHAZINE HCL 25 MG PO TABS: 25.0000 mg | ORAL_TABLET | Freq: Four times a day (QID) | ORAL | 3 refills | Status: DC | PRN

## 2020-04-07 MED ORDER — CILIDINIUM-CHLORDIAZEPOXIDE 2.5-5 MG PO CAPS: 1.0000 | ORAL_CAPSULE | Freq: Two times a day (BID) | ORAL | 3 refills | Status: DC | PRN

## 2020-04-07 NOTE — Telephone Encounter (Signed)
Dieu-ha the pharmacist calling to advise the clidinium-chlordiazePOXIDE (LIBRAX) 5-2.5 MG capsule Is coming up contraindicated for her age. Risk of falls and sedation. Level 1  Please advise if OK to fill, pharmacy needs dr approval.  [id number] [address], [place] Phone:  [phone]  Fax:  [phone]

## 2020-04-07 NOTE — Patient Instructions (Signed)
STOP DICYCLOMINE !!!

## 2020-04-12 ENCOUNTER — Other Ambulatory Visit: Payer: Self-pay

## 2020-04-12 DIAGNOSIS — K589 Irritable bowel syndrome without diarrhea: Secondary | ICD-10-CM

## 2020-04-12 MED ORDER — DICYCLOMINE HCL 20 MG PO TABS: 20.0000 mg | ORAL_TABLET | Freq: Four times a day (QID) | ORAL | 3 refills | Status: DC | PRN

## 2020-04-12 NOTE — Telephone Encounter (Signed)
Patient advised that medication refill was sent to pharmacy.

## 2020-04-18 NOTE — Progress Notes (Signed)
Subjective:   Bianca Malone is a 85 y.o. female who presents for Medicare Annual (Subsequent) preventive examination.  I connected with Bianca Malone today by telephone and verified that I am speaking with the correct person using two identifiers. Location patient: home Location provider: work Persons participating in the virtual visit: patient, provider.   I discussed the limitations, risks, security and privacy concerns of performing an evaluation and management service by telephone and the availability of in person appointments. I also discussed with the patient that there may be a patient responsible charge related to this service. The patient expressed understanding and verbally consented to this telephonic visit.    Interactive audio and video telecommunications were attempted between this provider and patient, however failed, due to patient having technical difficulties OR patient did not have access to video capability.  We continued and completed visit with audio only.  Review of Systems:  N/A  Cardiac Risk Factors include: hypertension;advanced age (>33men, >34 women)     Objective:     Vitals: There were no vitals taken for this visit.  There is no height or weight on file to calculate BMI.  Advanced Directives 04/19/2020 02/26/2018 07/22/2017 11/29/2016  Does Patient Have a Medical Advance Directive? Yes Yes Yes Yes  Type of Paramedic of [place];Living will Living will [place];Living will Living will;Healthcare Power of Attorney  Does patient want to make changes to medical advance directive? - No - Patient declined - -  Copy of [place] in Chart? No - copy requested - - No - copy requested    Tobacco Social History   Tobacco Use  Smoking Status Never Smoker  Smokeless Tobacco Never Used     Counseling given: Not Answered   Clinical Intake:  Pre-visit preparation completed: Yes  Pain : No/denies  pain Pain Score: 0-No pain     Nutritional Risks: None Diabetes: No  How often do you need to have someone help you when you read instructions, pamphlets, or other written materials from your doctor or pharmacy?: 1 - Never  Interpreter Needed?: No  Information entered by :: [facility], LPN  Past Medical History:  Diagnosis Date  . Actinic keratosis   . Arthritis   . Atypical mole 08/31/2008   L suprapubic  . GERD (gastroesophageal reflux disease)   . Hypertension   . Vaginal prolapse    Past Surgical History:  Procedure Laterality Date  . ABDOMINAL ADHESION SURGERY     small bowel lysis of adhesions  . BREAST CYST ASPIRATION Left   . CARPAL TUNNEL RELEASE    . COLONOSCOPY    . COLONOSCOPY WITH PROPOFOL N/A 07/22/2017   Procedure: COLONOSCOPY WITH PROPOFOL;  Surgeon: Manya Silvas, MD;  Location: [facility] ENDOSCOPY;  Service: Endoscopy;  Laterality: N/A;  . CYST EXCISION     thyroglossal duct cyst surgery  . ESOPHAGOGASTRODUODENOSCOPY (EGD) WITH PROPOFOL N/A 07/22/2017   Procedure: ESOPHAGOGASTRODUODENOSCOPY (EGD) WITH PROPOFOL;  Surgeon: Manya Silvas, MD;  Location: [facility] ENDOSCOPY;  Service: Endoscopy;  Laterality: N/A;  . HYSTEROSCOPY    . INCISION AND DRAINAGE / EXCISION THYROGLOSSAL CYST    . UPPER GASTROINTESTINAL ENDOSCOPY     Family History  Problem Relation Age of Onset  . Alzheimer's disease Mother   . Hypertension Mother   . Kidney failure Mother   . Hypertension Father   . CAD Father   . Heart disease Brother   . Anemia Brother   .  Heart disease Brother   . Myelodysplastic syndrome Brother   . Breast cancer Paternal Aunt   . Prostate cancer Paternal Uncle    Social History   Socioeconomic History  . Marital status: Married    Spouse name: Not on file  . Number of children: 2  . Years of education: Not on file  . Highest education level: Some college, no degree  Occupational History  . Not on file  Tobacco Use  . Smoking status: Never  Smoker  . Smokeless tobacco: Never Used  Substance and Sexual Activity  . Alcohol use: Yes    Alcohol/week: 0.0 - 3.0 standard drinks  . Drug use: No  . Sexual activity: Yes    Birth control/protection: Condom  Other Topics Concern  . Not on file  Social History Narrative  . Not on file   Social Determinants of Health   Financial Resource Strain: Low Risk   . Difficulty of Paying Living Expenses: Not hard at all  Food Insecurity: No Food Insecurity  . Worried About Charity fundraiser in the Last Year: Never true  . Ran Out of Food in the Last Year: Never true  Transportation Needs: No Transportation Needs  . Lack of Transportation (Medical): No  . Lack of Transportation (Non-Medical): No  Physical Activity: Sufficiently Active  . Days of Exercise per Week: 4 days  . Minutes of Exercise per Session: 60 min  Stress: Stress Concern Present  . Feeling of Stress : To some extent  Social Connections: Slightly Isolated  . Frequency of Communication with Friends and Family: More than three times a week  . Frequency of Social Gatherings with Friends and Family: More than three times a week  . Attends Religious Services: Never  . Active Member of Clubs or Organizations: Yes  . Attends Archivist Meetings: More than 4 times per year  . Marital Status: Married    Outpatient Encounter Medications as of 04/19/2020  Medication Sig  . aspirin 81 MG chewable tablet Chew by mouth.   Marland Kitchen CALCIUM CARBONATE-VITAMIN D PO Take 1 tablet by mouth daily. Reported on 03/08/2016  . calcium-vitamin D (OSCAL WITH D) 500-200 MG-UNIT tablet Take 1 tablet by mouth.  . clidinium-chlordiazePOXIDE (LIBRAX) 5-2.5 MG capsule Take 1 capsule by mouth 2 (two) times daily as needed.  . dicyclomine (BENTYL) 20 MG tablet Take 1 tablet (20 mg total) by mouth every 6 (six) hours as needed for spasms.  . hydrALAZINE (APRESOLINE) 25 MG tablet TAKE 1 TABLET BY MOUTH TWICE A DAY  . ibuprofen (ADVIL,MOTRIN) 600 MG  tablet Take 1 tablet (600 mg total) by mouth every 6 (six) hours as needed.  Marland Kitchen losartan (COZAAR) 100 MG tablet TAKE 1 TABLET BY MOUTH EVERY DAY  . pantoprazole (PROTONIX) 40 MG tablet TAKE 1 TABLET BY MOUTH EVERY DAY  . polyethylene glycol (MIRALAX / GLYCOLAX) packet Take 17 g by mouth daily as needed.  . promethazine (PHENERGAN) 25 MG tablet Take 1 tablet (25 mg total) by mouth every 6 (six) hours as needed for nausea or vomiting.  . mupirocin ointment (BACTROBAN) 2 % Apply to skin at each bandage change (Patient not taking: Reported on 04/07/2020)   No facility-administered encounter medications on file as of 04/19/2020.    Activities of Daily Living In your present state of health, do you have any difficulty performing the following activities: 04/19/2020 10/08/2019  Hearing? N Y  Comment Wears bilateral hearing aids. -  Vision? N N  Difficulty  concentrating or making decisions? N N  Walking or climbing stairs? N N  Dressing or bathing? N N  Doing errands, shopping? N N  Preparing Food and eating ? N -  Using the Toilet? N -  In the past six months, have you accidently leaked urine? N -  Do you have problems with loss of bowel control? N -  Managing your Medications? N -  Managing your Finances? N -  Housekeeping or managing your Housekeeping? N -  Some recent data might be hidden    Patient Care Team: Jerrol Banana., MD as PCP - General (Family Medicine) Benjaman Kindler, MD as Consulting Physician (Obstetrics and Gynecology) Birder Robson, MD as Referring Physician (Ophthalmology) Beverly Gust, MD as Consulting Physician (Otolaryngology) Ralene Bathe, MD (Dermatology)    Assessment:   This is a routine wellness examination for Bianca Malone.  Exercise Activities and Dietary recommendations Current Exercise Habits: Home exercise routine, Type of exercise: walking(rides a bicycle), Time (Minutes): 55, Frequency (Times/Week): 4, Weekly Exercise (Minutes/Week): 220,  Intensity: Mild, Exercise limited by: None identified  Goals    . DIET - EAT MORE VEGETABLES     Recommend to eat 2 servings of vegetables every day.        Fall Risk: Fall Risk  04/19/2020 10/08/2019 01/15/2018 11/29/2016 05/21/2016  Falls in the past year? 0 0 No No No  Number falls in past yr: 0 0 - - -  Injury with Fall? 0 0 - - -  Follow up - Falls evaluation completed - - -    FALL RISK PREVENTION PERTAINING TO THE HOME:  Any stairs in or around the home? Yes  If so, are there any without handrails? No   Home free of loose throw rugs in walkways, pet beds, electrical cords, etc? Yes  Adequate lighting in your home to reduce risk of falls? Yes   ASSISTIVE DEVICES UTILIZED TO PREVENT FALLS:  Life alert? No  Use of a cane, walker or w/c? No  Grab bars in the bathroom? Yes  Shower chair or bench in shower? No  Elevated toilet seat or a handicapped toilet? Yes    TIMED UP AND GO:  Was the test performed? No .    Depression Screen PHQ 2/9 Scores 04/19/2020 10/08/2019 01/15/2018 01/15/2018  PHQ - 2 Score 0 1 0 0  PHQ- 9 Score - 1 0 -     Cognitive Function: Declined today.       6CIT Screen 11/29/2016  What Year? 0 points  What month? 0 points  What time? 0 points  Count back from 20 0 points  Months in reverse 0 points  Repeat phrase 2 points  Total Score 2    Immunization History  Administered Date(s) Administered  . Fluad Quad(high Dose 65+) 09/02/2019  . Influenza Split 11/04/2012  . Influenza, High Dose Seasonal PF 11/16/2014, 08/22/2015, 09/29/2016, 09/05/2017, 09/16/2018  . Influenza,inj,Quad PF,6+ Mos 10/14/2013  . Pneumococcal Conjugate-13 11/16/2014  . Pneumococcal Polysaccharide-23 03/13/2011  . Td 03/30/2004, 04/07/2019    Qualifies for Shingles Vaccine? Yes . Due for Shingrix. Pt has been advised to call insurance company to determine out of pocket expense. Advised may also receive vaccine at local pharmacy or Health Dept. Verbalized  acceptance and understanding.  Tdap: Up to date  Flu Vaccine: Up to date  Pneumococcal Vaccine: Completed series  Screening Tests Health Maintenance  Topic Date Due  . COVID-19 Vaccine (1) Never done  . DEXA SCAN  03/01/2020  .  INFLUENZA VACCINE  07/03/2020  . TETANUS/TDAP  04/06/2029  . PNA vac Low Risk Adult  Completed    Cancer Screenings:  Colorectal Screening: No longer required.   Mammogram: No longer required.   Bone Density: Completed 03/02/15. Results reflect OSTEOPENIA. Repeat every 5 years. Ordered today. Pt aware the office will call re: appt.  Lung Cancer Screening: (Low Dose CT Chest recommended if Age 72-80 years, 30 pack-year currently smoking OR have quit w/in 15years.) does not qualify.   Additional Screening:  Vision Screening: Recommended annual ophthalmology exams for early detection of glaucoma and other disorders of the eye.  Dental Screening: Recommended annual dental exams for proper oral hygiene  Community Resource Referral:  CRR required this visit? No     Plan:  I have personally reviewed and addressed the Medicare Annual Wellness questionnaire and have noted the following in the patient's chart:  A. Medical and social history B. Use of alcohol, tobacco or illicit drugs  C. Current medications and supplements D. Functional ability and status E.  Nutritional status F.  Physical activity G. Advance directives H. List of other physicians I.  Hospitalizations, surgeries, and ER visits in previous 12 months J.  [place] such as hearing and vision if needed, cognitive and depression L. Referrals and appointments   In addition, I have reviewed and discussed with patient certain preventive protocols, quality metrics, and best practice recommendations. A written personalized care plan for preventive services as well as general preventive health recommendations were provided to patient. Nurse Health Advisor  Signed,    Kimo Bancroft  [place], [place]  [id number] Nurse Health Advisor   Nurse Notes: None.

## 2020-04-19 ENCOUNTER — Other Ambulatory Visit: Payer: Self-pay

## 2020-04-19 ENCOUNTER — Ambulatory Visit (INDEPENDENT_AMBULATORY_CARE_PROVIDER_SITE_OTHER): Payer: Medicare Other

## 2020-04-19 DIAGNOSIS — Z0000 Encounter for general adult medical examination without abnormal findings: Secondary | ICD-10-CM | POA: Diagnosis not present

## 2020-04-19 DIAGNOSIS — E2839 Other primary ovarian failure: Secondary | ICD-10-CM

## 2020-04-19 NOTE — Patient Instructions (Signed)
Bianca Malone , Thank you for taking time to come for your Medicare Wellness Visit. I appreciate your ongoing commitment to your health goals. Please review the following plan we discussed and let me know if I can assist you in the future.   Screening recommendations/referrals: Colonoscopy: No longer required.  Mammogram: No longer required.  Bone Density: Ordered today. Pt aware the office will call re: appt. Recommended yearly ophthalmology/optometry visit for glaucoma screening and checkup Recommended yearly dental visit for hygiene and checkup  Vaccinations: Influenza vaccine: Up to date Pneumococcal vaccine: Completed series Tdap vaccine: Up to date, due 04/2029 Shingles vaccine: Pt declines today.     Advanced directives: Please bring a copy of your POA (Power of Attorney) and/or Living Will to your next appointment.   Conditions/risks identified: Recommend to eat 2 servings of vegetables every day.   Next appointment: 07/12/20 @ 9:20 AM with Dr Rosanna Randy. Declined scheduling an AWV for 2022 at this time.    Preventive Care 40 Years and Older, Female Preventive care refers to lifestyle choices and visits with your health care provider that can promote health and wellness. What does preventive care include?  A yearly physical exam. This is also called an annual well check.  Dental exams once or twice a year.  Routine eye exams. Ask your health care provider how often you should have your eyes checked.  Personal lifestyle choices, including:  Daily care of your teeth and gums.  Regular physical activity.  Eating a healthy diet.  Avoiding tobacco and drug use.  Limiting alcohol use.  Practicing safe sex.  Taking low-dose aspirin every day.  Taking vitamin and mineral supplements as recommended by your health care provider. What happens during an annual well check? The services and screenings done by your health care provider during your annual well check will depend on  your age, overall health, lifestyle risk factors, and family history of disease. Counseling  Your health care provider may ask you questions about your:  Alcohol use.  Tobacco use.  Drug use.  Emotional well-being.  Home and relationship well-being.  Sexual activity.  Eating habits.  History of falls.  Memory and ability to understand (cognition).  Work and work Statistician.  Reproductive health. Screening  You may have the following tests or measurements:  Height, weight, and BMI.  Blood pressure.  Lipid and cholesterol levels. These may be checked every 5 years, or more frequently if you are over 63 years old.  Skin check.  Lung cancer screening. You may have this screening every year starting at age 9 if you have a 30-pack-year history of smoking and currently smoke or have quit within the past 15 years.  Fecal occult blood test (FOBT) of the stool. You may have this test every year starting at age 72.  Flexible sigmoidoscopy or colonoscopy. You may have a sigmoidoscopy every 5 years or a colonoscopy every 10 years starting at age 34.  Hepatitis C blood test.  Hepatitis B blood test.  Sexually transmitted disease (STD) testing.  Diabetes screening. This is done by checking your blood sugar (glucose) after you have not eaten for a while (fasting). You may have this done every 1-3 years.  Bone density scan. This is done to screen for osteoporosis. You may have this done starting at age 54.  Mammogram. This may be done every 1-2 years. Talk to your health care provider about how often you should have regular mammograms. Talk with your health care provider about your  test results, treatment options, and if necessary, the need for more tests. Vaccines  Your health care provider may recommend certain vaccines, such as:  Influenza vaccine. This is recommended every year.  Tetanus, diphtheria, and acellular pertussis (Tdap, Td) vaccine. You may need a Td booster  every 10 years.  Zoster vaccine. You may need this after age 80.  Pneumococcal 13-valent conjugate (PCV13) vaccine. One dose is recommended after age 24.  Pneumococcal polysaccharide (PPSV23) vaccine. One dose is recommended after age 13. Talk to your health care provider about which screenings and vaccines you need and how often you need them. This information is not intended to replace advice given to you by your health care provider. Make sure you discuss any questions you have with your health care provider. Document Released: 12/16/2015 Document Revised: 08/08/2016 Document Reviewed: 09/20/2015 Elsevier Interactive Patient Education  2017 [place] Prevention in the Home Falls can cause injuries. They can happen to people of all ages. There are many things you can do to make your home safe and to help prevent falls. What can I do on the outside of my home?  Regularly fix the edges of walkways and driveways and fix any cracks.  Remove anything that might make you trip as you walk through a door, such as a raised step or threshold.  Trim any bushes or trees on the path to your home.  Use bright outdoor lighting.  Clear any walking paths of anything that might make someone trip, such as rocks or tools.  Regularly check to see if handrails are loose or broken. Make sure that both sides of any steps have handrails.  Any raised decks and porches should have guardrails on the edges.  Have any leaves, snow, or ice cleared regularly.  Use sand or salt on walking paths during winter.  Clean up any spills in your garage right away. This includes oil or grease spills. What can I do in the bathroom?  Use night lights.  Install grab bars by the toilet and in the tub and shower. Do not use towel bars as grab bars.  Use non-skid mats or decals in the tub or shower.  If you need to sit down in the shower, use a plastic, non-slip stool.  Keep the floor dry. Clean up any  water that spills on the floor as soon as it happens.  Remove soap buildup in the tub or shower regularly.  Attach bath mats securely with double-sided non-slip rug tape.  Do not have throw rugs and other things on the floor that can make you trip. What can I do in the bedroom?  Use night lights.  Make sure that you have a light by your bed that is easy to reach.  Do not use any sheets or blankets that are too big for your bed. They should not hang down onto the floor.  Have a firm chair that has side arms. You can use this for support while you get dressed.  Do not have throw rugs and other things on the floor that can make you trip. What can I do in the kitchen?  Clean up any spills right away.  Avoid walking on wet floors.  Keep items that you use a lot in easy-to-reach places.  If you need to reach something above you, use a strong step stool that has a grab bar.  Keep electrical cords out of the way.  Do not use floor polish or wax that  makes floors slippery. If you must use wax, use non-skid floor wax.  Do not have throw rugs and other things on the floor that can make you trip. What can I do with my stairs?  Do not leave any items on the stairs.  Make sure that there are handrails on both sides of the stairs and use them. Fix handrails that are broken or loose. Make sure that handrails are as long as the stairways.  Check any carpeting to make sure that it is firmly attached to the stairs. Fix any carpet that is loose or worn.  Avoid having throw rugs at the top or bottom of the stairs. If you do have throw rugs, attach them to the floor with carpet tape.  Make sure that you have a light switch at the top of the stairs and the bottom of the stairs. If you do not have them, ask someone to add them for you. What else can I do to help prevent falls?  Wear shoes that:  Do not have high heels.  Have rubber bottoms.  Are comfortable and fit you well.  Are closed  at the toe. Do not wear sandals.  If you use a stepladder:  Make sure that it is fully opened. Do not climb a closed stepladder.  Make sure that both sides of the stepladder are locked into place.  Ask someone to hold it for you, if possible.  Clearly mark and make sure that you can see:  Any grab bars or handrails.  First and last steps.  Where the edge of each step is.  Use tools that help you move around (mobility aids) if they are needed. These include:  Canes.  Walkers.  Scooters.  Crutches.  Turn on the lights when you go into a dark area. Replace any light bulbs as soon as they burn out.  Set up your furniture so you have a clear path. Avoid moving your furniture around.  If any of your floors are uneven, fix them.  If there are any pets around you, be aware of where they are.  Review your medicines with your doctor. Some medicines can make you feel dizzy. This can increase your chance of falling. Ask your doctor what other things that you can do to help prevent falls. This information is not intended to replace advice given to you by your health care provider. Make sure you discuss any questions you have with your health care provider. Document Released: 09/15/2009 Document Revised: 04/26/2016 Document Reviewed: 12/24/2014 Elsevier Interactive Patient Education  2017 [employer].

## 2020-04-27 ENCOUNTER — Other Ambulatory Visit: Payer: Self-pay

## 2020-04-27 ENCOUNTER — Ambulatory Visit (INDEPENDENT_AMBULATORY_CARE_PROVIDER_SITE_OTHER): Payer: Medicare Other | Admitting: Dermatology

## 2020-04-27 DIAGNOSIS — L719 Rosacea, unspecified: Secondary | ICD-10-CM

## 2020-04-27 DIAGNOSIS — Z86018 Personal history of other benign neoplasm: Secondary | ICD-10-CM

## 2020-04-27 DIAGNOSIS — Z1283 Encounter for screening for malignant neoplasm of skin: Secondary | ICD-10-CM | POA: Diagnosis not present

## 2020-04-27 DIAGNOSIS — L708 Other acne: Secondary | ICD-10-CM

## 2020-04-27 DIAGNOSIS — L720 Epidermal cyst: Secondary | ICD-10-CM

## 2020-04-27 DIAGNOSIS — L814 Other melanin hyperpigmentation: Secondary | ICD-10-CM

## 2020-04-27 DIAGNOSIS — L821 Other seborrheic keratosis: Secondary | ICD-10-CM

## 2020-04-27 DIAGNOSIS — D239 Other benign neoplasm of skin, unspecified: Secondary | ICD-10-CM

## 2020-04-27 DIAGNOSIS — D1801 Hemangioma of skin and subcutaneous tissue: Secondary | ICD-10-CM

## 2020-04-27 DIAGNOSIS — L570 Actinic keratosis: Secondary | ICD-10-CM

## 2020-04-27 DIAGNOSIS — Z85828 Personal history of other malignant neoplasm of skin: Secondary | ICD-10-CM

## 2020-04-27 DIAGNOSIS — D229 Melanocytic nevi, unspecified: Secondary | ICD-10-CM

## 2020-04-27 DIAGNOSIS — L578 Other skin changes due to chronic exposure to nonionizing radiation: Secondary | ICD-10-CM

## 2020-04-27 NOTE — Progress Notes (Signed)
Follow-Up Visit   Subjective  Bianca Malone is a 85 y.o. female who presents for the following: Annual Exam (1 year f/u TBSE, Hx of BCC). Pt had a cyst drained from her right axilla 2 months Hx of Dysplastic nevus.  The patient presents for Total-Body Skin Exam (TBSE) for skin cancer screening and mole check.  The following portions of the chart were reviewed this encounter and updated as appropriate:  Tobacco  Allergies  Meds  Problems  Med Hx  Surg Hx  Fam Hx      Review of Systems:  No other skin or systemic complaints except as noted in HPI or Assessment and Plan.  Objective  Well appearing patient in no apparent distress; mood and affect are within normal limits.  A full examination was performed including scalp, head, eyes, ears, nose, lips, neck, chest, axillae, abdomen, back, buttocks, bilateral upper extremities, bilateral lower extremities, hands, feet, fingers, toes, fingernails, and toenails. All findings within normal limits unless otherwise noted below.  Objective  R cheek: Erythematous thin papules/macules with gritty scale.   Objective  L upper lip: Dilated pore  Objective  Right Axilla: 1.0 cm Subcutaneous nodule.   Objective  Scalp: Smooth white papule(s).   Objective  Head - Anterior (Face): Mid face erythema with telangiectasias +/- scattered inflammatory papules.    Assessment & Plan    Lentigines - Scattered tan macules - Discussed due to sun exposure - Benign, observe - Call for any changes  Seborrheic Keratoses - Stuck-on, waxy, tan-brown papules and plaques  - Discussed benign etiology and prognosis. - Observe - Call for any changes  Melanocytic Nevi - Tan-brown and/or pink-flesh-colored symmetric macules and papules - Benign appearing on exam today - Observation - Call clinic for new or changing moles - Recommend daily use of broad spectrum spf 30+ sunscreen to sun-exposed areas.   Hemangiomas - Red papules - Discussed  benign nature - Observe - Call for any changes  Actinic Damage - diffuse scaly erythematous macules with underlying dyspigmentation - Recommend daily broad spectrum sunscreen SPF 30+ to sun-exposed areas, reapply every 2 hours as needed.  - Call for new or changing lesions.  Skin cancer screening performed today.  AK (actinic keratosis) R cheek  Destruction of lesion - R cheek Complexity: simple   Destruction method: cryotherapy   Informed consent: discussed and consent obtained   Timeout:  patient name, date of birth, surgical site, and procedure verified Lesion destroyed using liquid nitrogen: Yes   Region frozen until ice ball extended beyond lesion: Yes   Outcome: patient tolerated procedure well with no complications   Post-procedure details: wound care instructions given    Dilated pore of Winer L upper lip Discussed treatment option of surgery but do not recommend it. Observe   Epidermal cyst Right Axilla With history of rupture and status post I&D.  The cyst persists. Recommend surgery removal   Milia Scalp  We will remove at cyst surgery-R axilla  appt time   Sample of Retin Micro 0.% cream sample given use qhs on small areas on face   Rosacea Head - Anterior (Face)  Discussed BBL laser cosmetic treatment would help, not covered by insurance  Return in about 1 year (around 04/27/2021) for TBSE, surgery appt for cyst at R axilla .  IMarye Round, CMA, am acting as scribe for Sarina Ser, MD .  Documentation: I have reviewed the above documentation for accuracy and completeness, and I agree with the above.  Sarina Ser, MD

## 2020-04-27 NOTE — Patient Instructions (Signed)
Pre-Operative Instructions  You are scheduled for a surgical procedure at [facility]. We recommend you read the following instructions. If you have any questions or concerns, please call the office at [phone].  1. Shower and wash the entire body with soap and water the day of your surgery paying special attention to cleansing at and around the planned surgery site.  2. Avoid aspirin or aspirin containing products at least fourteen (14) days prior to your surgical procedure and for at least one week (7 Days) after your surgical procedure. If you take aspirin on a regular basis for heart disease or history of stroke or for any other reason, we may recommend you continue taking aspirin but please notify us if you take this on a regular basis. Aspirin can cause more bleeding to occur during surgery as well as prolonged bleeding and bruising after surgery.   3. Avoid other nonsteroidal pain medications at least one week prior to surgery and at least one week prior to your surgery. These include medications such as Ibuprofen (Motrin, Advil and Nuprin), Naprosyn, Voltaren, Relafen, etc. If medications are used for therapeutic reasons, please inform us as they can cause increased bleeding or prolonged bleeding during and bruising after surgical procedures.   4. Please advice [place] if you are taking any "blood thinner" medications such as Coumadin or Dipyridamole or Plavix or similar medications. These cause increased bleeding and prolonged bleeding during and bruising after surgical procedures. We may have to consider discontinuing these medications briefly prior to and shortly after your surgery, if safe to do so.   5. Please inform us of all medications you are currently taking. All medications that are taken regularly should be taken the day of surgery as you always do. Nevertheless, we need to be informed of what medications you are taking prior to surgery to whether they will affect the  procedure or cause any complications.   6. Please inform us of any medication allergies. Also inform us of whether you have allergies to Latex or rubber products or whether you have had any adverse reaction to Lidocaine or Epinephrine.  7. Please inform us of any prosthetic or artificial body parts such as artificial heart valve, joint replacements, etc., or similar condition that might require preoperative antibiotics.   8. We recommend avoidance of alcohol at least two weeks prior to surgery and continued avoidence for at least two weeks after surgery.   9. We recommend discontinuation of tobacco smoking at least two weeks prior to surgery and continued abstinence for at least two weeks after surgery.  10. Do not plan strenuous exercise, strenuous work or strenuous lifting for approximately four weeks after your surgery.   11. We request if you are unable to make your scheduled surgical appointment, please call us at least a week in advance or as soon as you are aware of a problem sot aht we can cancel or reschedule you.   12. You MAKE TAKE TYLENOL (acetaminophen) for pain as it is not a blood thinner.   13. PLEASE PLAN TO BE IN TOWN FOR TWO WEEKS FOLLOWING SURGERY, THIS IS IMPORTANT SO YOU CAN BE CHECKED FOR DRESSING CHANGES, SUTURE REMOVAL AND TO MONITOR FOR POSSIBLE COMPLICATIONS  Cryotherapy Aftercare  Wash gently with soap and water everyday.   Apply Vaseline and Band-Aid daily until healed.

## 2020-04-29 ENCOUNTER — Encounter: Payer: Self-pay | Admitting: Dermatology

## 2020-05-24 ENCOUNTER — Other Ambulatory Visit: Payer: Self-pay

## 2020-05-24 ENCOUNTER — Ambulatory Visit (INDEPENDENT_AMBULATORY_CARE_PROVIDER_SITE_OTHER): Payer: Medicare Other | Admitting: Dermatology

## 2020-05-24 DIAGNOSIS — L720 Epidermal cyst: Secondary | ICD-10-CM

## 2020-05-24 NOTE — Progress Notes (Signed)
   Follow-Up Visit   Subjective  Bianca Malone is a 85 y.o. female who presents for the following: Cyst (vs other of right axilla - Excise today).  The following portions of the chart were reviewed this encounter and updated as appropriate:  Tobacco  Allergies  Meds  Problems  Med Hx  Surg Hx  Fam Hx      Review of Systems:  No other skin or systemic complaints except as noted in HPI or Assessment and Plan.  Objective  Well appearing patient in no apparent distress; mood and affect are within normal limits.  A focused examination was performed including right axilla. Relevant physical exam findings are noted in the Assessment and Plan.  Objective  Right Axilla: 1.0 cm subcutaneous nodule.    Assessment & Plan  Epidermal inclusion cyst Right Axilla  Skin excision - Right Axilla  Lesion length (cm):  1 Lesion width (cm):  1 Margin per side (cm):  0.2 Total excision diameter (cm):  1.4 Informed consent: discussed and consent obtained   Timeout: patient name, date of birth, surgical site, and procedure verified   Procedure prep:  Patient was prepped and draped in usual sterile fashion Prep type:  Isopropyl alcohol and povidone-iodine Anesthesia: the lesion was anesthetized in a standard fashion   Anesthetic:  1% lidocaine w/ epinephrine 1-100,000 buffered w/ 8.4% NaHCO3 Instrument used: #15 blade   Hemostasis achieved with: pressure   Hemostasis achieved with comment:  Electrocautery Outcome: patient tolerated procedure well with no complications   Post-procedure details: sterile dressing applied and wound care instructions given   Dressing type: bandage and pressure dressing (mupirocin)    Skin repair - Right Axilla Complexity:  Complex Final length (cm):  3 Reason for type of repair: reduce tension to allow closure, reduce the risk of dehiscence, infection, and necrosis, reduce subcutaneous dead space and avoid a hematoma, allow closure of the large defect, preserve  normal anatomy, preserve normal anatomical and functional relationships and enhance both functionality and cosmetic results   Undermining: area extensively undermined   Undermining comment:  Undermining defect - 1.4 cm Subcutaneous layers (deep stitches):  Suture size:  4-0 Suture type: Vicryl (polyglactin 910)   Subcutaneous suture technique: inverted dermal. Fine/surface layer approximation (top stitches):  Suture size:  4-0 Suture type: nylon   Stitches: simple running   Suture removal (days):  7 Hemostasis achieved with: suture and pressure Outcome: patient tolerated procedure well with no complications   Post-procedure details: sterile dressing applied and wound care instructions given   Dressing type: bandage and pressure dressing (mupirocin)   Additional details:  Mupirocin qd with dressing change - patient has.  Specimen 1 - Surgical pathology Differential Diagnosis: Cyst vs other  Check Margins: No 1.0 cm subcutaneous nodule.  Return in about 1 week (around 05/31/2020).   I, Ashok Cordia, CMA, am acting as scribe for Sarina Ser, MD .  Documentation: I have reviewed the above documentation for accuracy and completeness, and I agree with the above.  Sarina Ser, MD

## 2020-05-24 NOTE — Patient Instructions (Signed)

## 2020-05-25 ENCOUNTER — Encounter: Payer: Self-pay | Admitting: Dermatology

## 2020-05-30 ENCOUNTER — Telehealth: Payer: Self-pay

## 2020-05-30 NOTE — Telephone Encounter (Signed)
-----   Message from Ralene Bathe, MD sent at 05/30/2020 11:41 AM EDT ----- Skin (M), right axilla EXCISION, EPIDERMOID CYST, MARGINS FREE  Benign cyst

## 2020-05-30 NOTE — Telephone Encounter (Signed)
Discussed biopsy results with pt husband  

## 2020-05-31 ENCOUNTER — Other Ambulatory Visit: Payer: Self-pay

## 2020-05-31 ENCOUNTER — Ambulatory Visit (INDEPENDENT_AMBULATORY_CARE_PROVIDER_SITE_OTHER): Payer: Medicare Other | Admitting: Dermatology

## 2020-05-31 DIAGNOSIS — L720 Epidermal cyst: Secondary | ICD-10-CM

## 2020-05-31 DIAGNOSIS — Z4802 Encounter for removal of sutures: Secondary | ICD-10-CM

## 2020-05-31 NOTE — Progress Notes (Signed)
   Follow-Up Visit   Subjective  Bianca Malone is a 85 y.o. female who presents for the following: Suture / Staple Removal (1 week f/u R axilla biopsy proven Cyst) and growth (Pt here to have a growth removed from her R side of scalp, growing  ).   The following portions of the chart were reviewed this encounter and updated as appropriate:  Tobacco  Allergies  Meds  Problems  Med Hx  Surg Hx  Fam Hx      Review of Systems:  No other skin or systemic complaints except as noted in HPI or Assessment and Plan.  Objective  Well appearing patient in no apparent distress; mood and affect are within normal limits.  A focused examination was performed including face, scalp, R axilla. Relevant physical exam findings are noted in the Assessment and Plan.  Objective  Right Axilla: Well healed scar with no evidence of recurrence.   Objective  R scalp: Smooth white papule   Assessment & Plan    Epidermal cyst Right Axilla  Biopsy proven Epidermal cyst   Encounter for Removal of Sutures - Incision site at the R axilla  is clean, dry and intact - Wound cleansed, sutures removed, wound cleansed and steri strips applied.  - Discussed pathology results showing Epidermal cyst  - Patient advised to keep steri-strips dry until they fall off. - Scars remodel for a full year. - Once steri-strips fall off, patient can apply over-the-counter silicone scar cream each night to help with scar remodeling if desired. - Patient advised to call with any concerns or if they notice any new or changing lesions.    Milia R scalp  Acne/Milia surgery - R scalp Location: R scalp   Informed Consent: Discussed risks (permanent scarring, light or dark discoloration, infection, pain, bleeding, bruising, redness, damage to adjacent structures, and recurrence of the lesion) and benefits of the procedure, as well as the alternatives.  Informed consent was obtained.  Preparation: The area was prepped with  alcohol.  Anesthesia: none  Procedure Details: An incision was made overlying the lesion. The lesion drained pus.  A small amount of fluid was drained.    Antibiotic ointment and a sterile pressure dressing were applied. The patient tolerated procedure well.  Total number of lesions drained: 1  Plan: The patient was instructed on post-op care. Recommend OTC analgesia as needed for pain.   Return for as scheduled. IMarye Round, CMA, am acting as scribe for Sarina Ser, MD .  Documentation: I have reviewed the above documentation for accuracy and completeness, and I agree with the above.  Sarina Ser, MD

## 2020-05-31 NOTE — Patient Instructions (Signed)

## 2020-06-13 ENCOUNTER — Encounter: Payer: Self-pay | Admitting: Dermatology

## 2020-06-16 ENCOUNTER — Other Ambulatory Visit: Payer: Self-pay | Admitting: Family Medicine

## 2020-06-16 DIAGNOSIS — K219 Gastro-esophageal reflux disease without esophagitis: Secondary | ICD-10-CM

## 2020-07-06 ENCOUNTER — Other Ambulatory Visit: Payer: Self-pay | Admitting: Family Medicine

## 2020-07-06 DIAGNOSIS — I10 Essential (primary) hypertension: Secondary | ICD-10-CM

## 2020-07-07 NOTE — Progress Notes (Signed)
I,April Miller,acting as a scribe for Wilhemena Durie, MD.,have documented all relevant documentation on the behalf of Wilhemena Durie, MD,as directed by  Wilhemena Durie, MD while in the presence of Wilhemena Durie, MD.   Established patient visit   Patient: Bianca Malone   DOB: 10/07/1935   85 y.o. Female  MRN: [id number] Visit Date: 07/12/2020  Today's healthcare provider: Wilhemena Durie, MD   Chief Complaint  Patient presents with  . Follow-up  . Hypertension   Subjective    HPI  Patient states her blood pressures at home runs 110s to 130s over 50s.  She states she has a long history of [place] hypertension. Hypertension, follow-up  BP Readings from Last 3 Encounters:  07/12/20 (!) 175/64  04/07/20 (!) 174/67  10/08/19 (!) 136/50   Wt Readings from Last 3 Encounters:  07/12/20 134 lb (60.8 kg)  04/07/20 136 lb 6.4 oz (61.9 kg)  10/08/19 136 lb 9.6 oz (62 kg)     She was last seen for hypertension 3 months ago.  BP at that visit was 174/67. Management since that visit includes; Controlled on losartan and hydralazine. She reports good compliance with treatment. She is not having side effects. none She is exercising. She is adherent to low salt diet.   Outside blood pressures are 127/54.  She does not smoke.  Use of agents associated with hypertension: none.   --------------------------------------------------------------------  Anxiety, Follow-up  She was last seen for anxiety 3 months ago. Changes made at last visit include; Chronic issue.  Consider low-dose SSRI in the future.   She reports good compliance with treatment. She reports good tolerance of treatment. She is not having side effects. none  She feels her anxiety is mild and Improved since last visit.  Symptoms: No chest pain No difficulty concentrating  No dizziness Yes fatigue  No feelings of losing control No insomnia  No irritable No palpitations  No panic attacks No  racing thoughts  No shortness of breath No sweating  No tremors/shakes    GAD-7 Results GAD-7 Generalized Anxiety Disorder Screening Tool 07/12/2020  1. Feeling Nervous, Anxious, or on Edge 1  2. Not Being Able to Stop or Control Worrying 1  3. Worrying Too Much About Different Things 1  4. Trouble Relaxing 0  5. Being So Restless it's Hard To Sit Still 0  6. Becoming Easily Annoyed or Irritable 0  7. Feeling Afraid As If Something Awful Might Happen 1  Total GAD-7 Score 4  Difficulty At Work, Home, or Getting  Along With Others? Not difficult at all    PHQ-9 Scores PHQ9 SCORE ONLY 07/12/2020 04/19/2020 10/08/2019  PHQ-9 Total Score 0 0 1    --------------------------------------------------------------------   Irritable bowel syndrome, unspecified type From 04/07/2020-We will refill the Bentyl as opposed to trying Librax for now.  Contraindicated in her age.  On the beers list       Medications: Outpatient Medications Prior to Visit  Medication Sig  . aspirin 81 MG chewable tablet Chew by mouth.   Marland Kitchen CALCIUM CARBONATE-VITAMIN D PO Take 2 tablets by mouth daily. Reported on 03/08/2016  . clidinium-chlordiazePOXIDE (LIBRAX) 5-2.5 MG capsule Take 1 capsule by mouth 2 (two) times daily as needed.  . dicyclomine (BENTYL) 20 MG tablet Take 1 tablet (20 mg total) by mouth every 6 (six) hours as needed for spasms.  . hydrALAZINE (APRESOLINE) 25 MG tablet TAKE 1 TABLET BY MOUTH TWICE A DAY  . ibuprofen (  ADVIL,MOTRIN) 600 MG tablet Take 1 tablet (600 mg total) by mouth every 6 (six) hours as needed.  Marland Kitchen losartan (COZAAR) 100 MG tablet TAKE 1 TABLET BY MOUTH EVERY DAY  . pantoprazole (PROTONIX) 40 MG tablet TAKE 1 TABLET BY MOUTH EVERY DAY  . polyethylene glycol (MIRALAX / GLYCOLAX) packet Take 17 g by mouth daily as needed.  . promethazine (PHENERGAN) 25 MG tablet Take 1 tablet (25 mg total) by mouth every 6 (six) hours as needed for nausea or vomiting.  . calcium-vitamin D (OSCAL WITH D)  500-200 MG-UNIT tablet Take 1 tablet by mouth 2 (two) times daily.    No facility-administered medications prior to visit.    Review of Systems  Constitutional: Negative for appetite change, chills, fatigue and fever.  Respiratory: Negative for chest tightness and shortness of breath.   Cardiovascular: Negative for chest pain and palpitations.  Gastrointestinal: Negative for abdominal pain, nausea and vomiting.  Neurological: Negative for dizziness and weakness.       Objective    BP (!) 175/64 (BP Location: Left Arm, Patient Position: Sitting, Cuff Size: Normal)   Pulse 60   Temp 98 F (36.7 C) (Other (Comment))   Resp 16   Ht 5\' 5"  (1.651 m)   Wt 134 lb (60.8 kg)   SpO2 97%   BMI 22.30 kg/m  BP Readings from Last 3 Encounters:  07/12/20 (!) 161/63  04/07/20 (!) 174/67  10/08/19 (!) 136/50   Wt Readings from Last 3 Encounters:  07/12/20 134 lb (60.8 kg)  04/07/20 136 lb 6.4 oz (61.9 kg)  10/08/19 136 lb 9.6 oz (62 kg)      Physical Exam Vitals reviewed.  Constitutional:      Appearance: Normal appearance. She is well-developed.     Comments: Patient appears younger than her age of 49.  HENT:     Head: Normocephalic and atraumatic.     Right Ear: Tympanic membrane and external ear normal.     Left Ear: Tympanic membrane and external ear normal.  Eyes:     General: No scleral icterus.    Conjunctiva/sclera: Conjunctivae normal.  Neck:     Thyroid: No thyromegaly.     Vascular: No carotid bruit.  Cardiovascular:     Rate and Rhythm: Normal rate and regular rhythm.     Heart sounds: Normal heart sounds.  Pulmonary:     Effort: Pulmonary effort is normal.     Breath sounds: Normal breath sounds.  Abdominal:     Palpations: Abdomen is soft.  Musculoskeletal:     Right lower leg: No edema.     Left lower leg: No edema.  Lymphadenopathy:     Cervical: No cervical adenopathy.  Skin:    General: Skin is warm and dry.  Neurological:     General: No focal  deficit present.     Mental Status: She is alert and oriented to person, place, and time.  Psychiatric:        Mood and Affect: Mood normal.        Behavior: Behavior normal.        Thought Content: Thought content normal.        Judgment: Judgment normal.       No results found for any visits on 07/12/20.  Assessment & Plan     1. Essential hypertension [place] hypertension.  Follow home readings.  2. Anxiety Clinically stable.  Husband and failing health.  3. HH (hiatus hernia) Per GI.   No  follow-ups on file.         Maci Eickholt Cranford Mon, MD  [facility] [phone] (phone) [phone] (fax)  [place]

## 2020-07-12 ENCOUNTER — Other Ambulatory Visit: Payer: Self-pay

## 2020-07-12 ENCOUNTER — Encounter: Payer: Self-pay | Admitting: Family Medicine

## 2020-07-12 ENCOUNTER — Ambulatory Visit (INDEPENDENT_AMBULATORY_CARE_PROVIDER_SITE_OTHER): Payer: Medicare Other | Admitting: Family Medicine

## 2020-07-12 VITALS — BP 161/63 | HR 61 | Temp 98.0°F | Resp 16 | Ht 65.0 in | Wt 134.0 lb

## 2020-07-12 DIAGNOSIS — F419 Anxiety disorder, unspecified: Secondary | ICD-10-CM

## 2020-07-12 DIAGNOSIS — I10 Essential (primary) hypertension: Secondary | ICD-10-CM | POA: Diagnosis not present

## 2020-07-12 DIAGNOSIS — K449 Diaphragmatic hernia without obstruction or gangrene: Secondary | ICD-10-CM | POA: Diagnosis not present

## 2020-09-19 ENCOUNTER — Telehealth: Payer: Self-pay

## 2020-09-19 NOTE — Telephone Encounter (Signed)
Copied from [place] [id number]. Topic: General - Inquiry >> Sep 19, 2020 12:18 PM Alanda Slim E wrote: Reason for CRM: Pt wanted Dr. Marlan Palau advice on if she should get her flu shot or moderna booster vaccine first/ please advise

## 2020-09-26 NOTE — Telephone Encounter (Signed)
Moderna then flu 2-4 weeks later.

## 2020-09-26 NOTE — Telephone Encounter (Signed)
Please review. Thanks!  

## 2020-09-26 NOTE — Telephone Encounter (Signed)
Patient was advised.  

## 2020-10-11 NOTE — Progress Notes (Signed)
I,April Miller,acting as a scribe for Wilhemena Durie, MD.,have documented all relevant documentation on the behalf of Wilhemena Durie, MD,as directed by  Wilhemena Durie, MD while in the presence of Wilhemena Durie, MD.   Established patient visit   Patient: Bianca Malone   DOB: 01/19/1935   85 y.o. Female  MRN: [id number] Visit Date: 10/12/2020  Today's healthcare provider: Wilhemena Durie, MD   Chief Complaint  Patient presents with  . Anxiety  . Follow-up  . Hypertension   Subjective    HPI  Patient coping well since the death of her husband in the last month.  They were married for 61 years. Needs refill on promethazine.  He does not check her blood pressures at home but thinks they run good outside the office. Hypertension, follow-up  BP Readings from Last 3 Encounters:  10/12/20 (!) 175/60  07/12/20 (!) 161/63  04/07/20 (!) 174/67   Wt Readings from Last 3 Encounters:  10/12/20 131 lb (59.4 kg)  07/12/20 134 lb (60.8 kg)  04/07/20 136 lb 6.4 oz (61.9 kg)     She was last seen for hypertension 3 months ago.  BP at that visit was 161/63. Management since that visit includes; [place] hypertension.  Follow home readings. She reports good compliance with treatment. She is not having side effects. none She is exercising. She is adherent to low salt diet.   Outside blood pressures are not checking.  She does not smoke.  Use of agents associated with hypertension: none.   --------------------------------------------------------------------  Anxiety, Follow-up  She was last seen for anxiety 3 months ago. Changes made at last visit include; Clinically stable.  Husband and failing health.   She reports good compliance with treatment. She reports good tolerance of treatment. She is not having side effects. none  She feels her anxiety is moderate and Worse since last visit.  Symptoms: No chest pain Yes difficulty concentrating  No dizziness  Yes fatigue  No feelings of losing control No insomnia  No irritable No palpitations  No panic attacks No racing thoughts  No shortness of breath No sweating  No tremors/shakes    GAD-7 Results GAD-7 Generalized Anxiety Disorder Screening Tool 07/12/2020  1. Feeling Nervous, Anxious, or on Edge 1  2. Not Being Able to Stop or Control Worrying 1  3. Worrying Too Much About Different Things 1  4. Trouble Relaxing 0  5. Being So Restless it's Hard To Sit Still 0  6. Becoming Easily Annoyed or Irritable 0  7. Feeling Afraid As If Something Awful Might Happen 1  Total GAD-7 Score 4  Difficulty At Work, Home, or Getting  Along With Others? Not difficult at all    PHQ-9 Scores PHQ9 SCORE ONLY 07/12/2020 04/19/2020 10/08/2019  PHQ-9 Total Score 0 0 1    --------------------------------------------------------------------      Medications: Outpatient Medications Prior to Visit  Medication Sig  . aspirin 81 MG chewable tablet Chew by mouth.   Marland Kitchen CALCIUM CARBONATE-VITAMIN D PO Take 2 tablets by mouth daily. Reported on 03/08/2016  . clidinium-chlordiazePOXIDE (LIBRAX) 5-2.5 MG capsule Take 1 capsule by mouth 2 (two) times daily as needed.  . dicyclomine (BENTYL) 20 MG tablet Take 1 tablet (20 mg total) by mouth every 6 (six) hours as needed for spasms.  . hydrALAZINE (APRESOLINE) 25 MG tablet TAKE 1 TABLET BY MOUTH TWICE A DAY  . ibuprofen (ADVIL,MOTRIN) 600 MG tablet Take 1 tablet (600 mg total) by mouth  every 6 (six) hours as needed.  Marland Kitchen losartan (COZAAR) 100 MG tablet TAKE 1 TABLET BY MOUTH EVERY DAY  . pantoprazole (PROTONIX) 40 MG tablet TAKE 1 TABLET BY MOUTH EVERY DAY  . polyethylene glycol (MIRALAX / GLYCOLAX) packet Take 17 g by mouth daily as needed.  . promethazine (PHENERGAN) 25 MG tablet Take 1 tablet (25 mg total) by mouth every 6 (six) hours as needed for nausea or vomiting.  . calcium-vitamin D (OSCAL WITH D) 500-200 MG-UNIT tablet Take 1 tablet by mouth 2 (two) times daily.   (Patient not taking: Reported on 10/12/2020)   No facility-administered medications prior to visit.    Review of Systems  Constitutional: Negative for appetite change, chills, fatigue and fever.  Respiratory: Negative for chest tightness and shortness of breath.   Cardiovascular: Negative for chest pain and palpitations.  Gastrointestinal: Negative for abdominal pain, nausea and vomiting.  Neurological: Negative for dizziness and weakness.       Objective    BP (!) 175/60 (BP Location: Left Arm, Cuff Size: Normal)   Pulse 62   Temp 98 F (36.7 C) (Oral)   Resp 16   Ht 5\' 5"  (1.651 m)   Wt 131 lb (59.4 kg)   SpO2 100%   BMI 21.80 kg/m  BP Readings from Last 3 Encounters:  10/12/20 (!) 175/60  07/12/20 (!) 161/63  04/07/20 (!) 174/67   Wt Readings from Last 3 Encounters:  10/12/20 131 lb (59.4 kg)  07/12/20 134 lb (60.8 kg)  04/07/20 136 lb 6.4 oz (61.9 kg)      Physical Exam Vitals reviewed.  Constitutional:      Appearance: Normal appearance. She is well-developed.     Comments: Patient appears younger than her age of 25.  HENT:     Head: Normocephalic and atraumatic.     Right Ear: Tympanic membrane and external ear normal.     Left Ear: Tympanic membrane and external ear normal.  Eyes:     General: No scleral icterus.    Conjunctiva/sclera: Conjunctivae normal.  Neck:     Thyroid: No thyromegaly.     Vascular: No carotid bruit.  Cardiovascular:     Rate and Rhythm: Normal rate and regular rhythm.     Heart sounds: Normal heart sounds.  Pulmonary:     Effort: Pulmonary effort is normal.     Breath sounds: Normal breath sounds.  Abdominal:     Palpations: Abdomen is soft.  Musculoskeletal:     Right lower leg: No edema.     Left lower leg: No edema.  Lymphadenopathy:     Cervical: No cervical adenopathy.  Skin:    General: Skin is warm and dry.  Neurological:     General: No focal deficit present.     Mental Status: She is alert and oriented to  person, place, and time.  Psychiatric:        Mood and Affect: Mood normal.        Behavior: Behavior normal.        Thought Content: Thought content normal.        Judgment: Judgment normal.       No results found for any visits on 10/12/20.  Assessment & Plan     1. Essential hypertension Blood pressure at home and bring in readings. - CBC w/Diff/Platelet - Comprehensive Metabolic Panel (CMET) - TSH  2. Anxiety Clinically stable  3. Need for influenza vaccination  - Flu Vaccine QUAD High Dose(Fluad)  4.  Gastroesophageal reflux disease without esophagitis Refill as needed promethazine - CBC w/Diff/Platelet - Comprehensive Metabolic Panel (CMET) - TSH  5. Irritable bowel syndrome, unspecified type  - CBC w/Diff/Platelet - Comprehensive Metabolic Panel (CMET) - TSH  6. ASCVD (arteriosclerotic cardiovascular disease) Risk factors treated - CBC w/Diff/Platelet - Comprehensive Metabolic Panel (CMET) - TSH   No follow-ups on file.         Novi Calia Cranford Mon, MD  [facility] [phone] (phone) [phone] (fax)  [place]

## 2020-10-12 ENCOUNTER — Encounter: Payer: Self-pay | Admitting: Family Medicine

## 2020-10-12 ENCOUNTER — Other Ambulatory Visit: Payer: Self-pay

## 2020-10-12 ENCOUNTER — Ambulatory Visit (INDEPENDENT_AMBULATORY_CARE_PROVIDER_SITE_OTHER): Payer: Medicare Other | Admitting: Family Medicine

## 2020-10-12 VITALS — BP 175/60 | HR 62 | Temp 98.0°F | Resp 16 | Ht 65.0 in | Wt 131.0 lb

## 2020-10-12 DIAGNOSIS — K589 Irritable bowel syndrome without diarrhea: Secondary | ICD-10-CM

## 2020-10-12 DIAGNOSIS — F419 Anxiety disorder, unspecified: Secondary | ICD-10-CM

## 2020-10-12 DIAGNOSIS — Z23 Encounter for immunization: Secondary | ICD-10-CM | POA: Diagnosis not present

## 2020-10-12 DIAGNOSIS — K219 Gastro-esophageal reflux disease without esophagitis: Secondary | ICD-10-CM | POA: Diagnosis not present

## 2020-10-12 DIAGNOSIS — I2510 Atherosclerotic heart disease of native coronary artery without angina pectoris: Secondary | ICD-10-CM

## 2020-10-12 DIAGNOSIS — I10 Essential (primary) hypertension: Secondary | ICD-10-CM | POA: Diagnosis not present

## 2020-10-13 ENCOUNTER — Ambulatory Visit: Payer: Medicare Other

## 2020-10-13 LAB — CBC WITH DIFFERENTIAL/PLATELET
Basophils Absolute: 0 10*3/uL (ref 0.0–0.2)
Basos: 1 %
EOS (ABSOLUTE): 0.1 10*3/uL (ref 0.0–0.4)
Eos: 2 %
Hematocrit: 39.4 % (ref 34.0–46.6)
Hemoglobin: 13.3 g/dL (ref 11.1–15.9)
Immature Grans (Abs): 0 10*3/uL (ref 0.0–0.1)
Immature Granulocytes: 0 %
Lymphocytes Absolute: 1.1 10*3/uL (ref 0.7–3.1)
Lymphs: 20 %
MCH: 30.9 pg (ref 26.6–33.0)
MCHC: 33.8 g/dL (ref 31.5–35.7)
MCV: 92 fL (ref 79–97)
Monocytes Absolute: 0.4 10*3/uL (ref 0.1–0.9)
Monocytes: 7 %
Neutrophils Absolute: 3.6 10*3/uL (ref 1.4–7.0)
Neutrophils: 70 %
Platelets: 223 10*3/uL (ref 150–450)
RBC: 4.3 x10E6/uL (ref 3.77–5.28)
RDW: 12.7 % (ref 11.7–15.4)
WBC: 5.1 10*3/uL (ref 3.4–10.8)

## 2020-10-13 LAB — COMPREHENSIVE METABOLIC PANEL
ALT: 12 IU/L (ref 0–32)
AST: 22 IU/L (ref 0–40)
Albumin/Globulin Ratio: 1.9 (ref 1.2–2.2)
Albumin: 4.8 g/dL — ABNORMAL HIGH (ref 3.6–4.6)
Alkaline Phosphatase: 71 IU/L (ref 44–121)
BUN/Creatinine Ratio: 14 (ref 12–28)
BUN: 12 mg/dL (ref 8–27)
Bilirubin Total: 1.5 mg/dL — ABNORMAL HIGH (ref 0.0–1.2)
CO2: 23 mmol/L (ref 20–29)
Calcium: 10.1 mg/dL (ref 8.7–10.3)
Chloride: 98 mmol/L (ref 96–106)
Creatinine, Ser: 0.87 mg/dL (ref 0.57–1.00)
GFR calc Af Amer: 71 mL/min/{1.73_m2} (ref >59)
GFR calc non Af Amer: 62 mL/min/{1.73_m2} (ref >59)
Globulin, Total: 2.5 g/dL (ref 1.5–4.5)
Glucose: 94 mg/dL (ref 65–99)
Potassium: 4.1 mmol/L (ref 3.5–5.2)
Sodium: 135 mmol/L (ref 134–144)
Total Protein: 7.3 g/dL (ref 6.0–8.5)

## 2020-10-13 LAB — TSH: TSH: 2.06 u[IU]/mL (ref 0.450–4.500)

## 2020-10-17 ENCOUNTER — Telehealth: Payer: Self-pay

## 2020-10-17 NOTE — Telephone Encounter (Signed)
Pt would like further explanation why she needs a hepatic function test. Pt asking for call back.  Do not see test ordered.

## 2020-10-18 NOTE — Telephone Encounter (Signed)
LMTCB 10/18/2020   Thanks,   -Alasdair Kleve  

## 2020-10-18 NOTE — Telephone Encounter (Signed)
Pt advised.   Thanks,   -Detron Carras  

## 2020-11-14 ENCOUNTER — Telehealth: Payer: Self-pay | Admitting: Family Medicine

## 2020-11-14 DIAGNOSIS — K589 Irritable bowel syndrome without diarrhea: Secondary | ICD-10-CM

## 2020-11-14 MED ORDER — PROMETHAZINE HCL 25 MG PO TABS: 25.0000 mg | ORAL_TABLET | Freq: Four times a day (QID) | ORAL | 3 refills | Status: DC | PRN

## 2020-11-14 NOTE — Telephone Encounter (Signed)
Pt called and never received her RX for Promethazine 25mg  from her 11.10.21visit / please advise asap before Pt goes out of town for the holidays on 12.15.21

## 2021-01-01 ENCOUNTER — Other Ambulatory Visit: Payer: Self-pay | Admitting: Family Medicine

## 2021-01-01 DIAGNOSIS — I10 Essential (primary) hypertension: Secondary | ICD-10-CM

## 2021-02-03 ENCOUNTER — Other Ambulatory Visit: Payer: Self-pay | Admitting: Family Medicine

## 2021-02-03 DIAGNOSIS — Z1231 Encounter for screening mammogram for malignant neoplasm of breast: Secondary | ICD-10-CM

## 2021-02-28 ENCOUNTER — Other Ambulatory Visit: Payer: Self-pay

## 2021-02-28 ENCOUNTER — Ambulatory Visit
Admission: RE | Admit: 2021-02-28 | Discharge: 2021-02-28 | Disposition: A | Discharge status: Home / Self Care | Payer: Medicare Other | Source: Ambulatory Visit | Attending: Family Medicine | Admitting: Family Medicine

## 2021-02-28 DIAGNOSIS — Z1231 Encounter for screening mammogram for malignant neoplasm of breast: Secondary | ICD-10-CM | POA: Insufficient documentation

## 2021-03-01 ENCOUNTER — Other Ambulatory Visit: Payer: Self-pay | Admitting: Family Medicine

## 2021-03-01 DIAGNOSIS — I10 Essential (primary) hypertension: Secondary | ICD-10-CM

## 2021-03-01 NOTE — Telephone Encounter (Signed)
Requested Prescriptions  Pending Prescriptions Disp Refills  . losartan (COZAAR) 100 MG tablet [Pharmacy Med Name: LOSARTAN POTASSIUM 100 MG TAB] 90 tablet 0    Sig: TAKE 1 TABLET BY MOUTH EVERY DAY     Cardiovascular:  Angiotensin Receptor Blockers Failed - 03/01/2021  1:47 AM      Failed - Last BP in normal range    BP Readings from Last 1 Encounters:  10/12/20 (!) 175/60         Passed - Cr in normal range and within 180 days    Creatinine  Date Value Ref Range Status  09/29/2014 1.10 0.60 - 1.30 mg/dL Final   Creatinine, Ser  Date Value Ref Range Status  10/12/2020 0.87 0.57 - 1.00 mg/dL Final         Passed - K in normal range and within 180 days    Potassium  Date Value Ref Range Status  10/12/2020 4.1 3.5 - 5.2 mmol/L Final  09/29/2014 3.5 3.5 - 5.1 mmol/L Final         Passed - Patient is not pregnant      Passed - Valid encounter within last 6 months    Recent Outpatient Visits          4 months ago Essential hypertension   [facility] Jerrol Banana., MD   7 months ago Essential hypertension   [facility] Jerrol Banana., MD   10 months ago Irritable bowel syndrome, unspecified type   [facility] Jerrol Banana., MD   1 year ago Cough   [facility] Jerrol Banana., MD   1 year ago Encounter for annual physical exam   [facility] Jerrol Banana., MD      Future Appointments            In 3 weeks Jerrol Banana., MD [facility] [place]

## 2021-03-08 ENCOUNTER — Other Ambulatory Visit: Payer: Self-pay

## 2021-03-08 ENCOUNTER — Encounter: Payer: Self-pay | Admitting: Ophthalmology

## 2021-03-10 ENCOUNTER — Other Ambulatory Visit: Payer: Self-pay | Admitting: Family Medicine

## 2021-03-10 ENCOUNTER — Other Ambulatory Visit: Payer: Self-pay

## 2021-03-10 ENCOUNTER — Other Ambulatory Visit
Admission: RE | Admit: 2021-03-10 | Discharge: 2021-03-10 | Disposition: A | Discharge status: Home / Self Care | Payer: Medicare Other | Source: Ambulatory Visit | Attending: Ophthalmology | Admitting: Ophthalmology

## 2021-03-10 DIAGNOSIS — Z20822 Contact with and (suspected) exposure to covid-19: Secondary | ICD-10-CM | POA: Diagnosis not present

## 2021-03-10 DIAGNOSIS — K219 Gastro-esophageal reflux disease without esophagitis: Secondary | ICD-10-CM

## 2021-03-10 DIAGNOSIS — Z01812 Encounter for preprocedural laboratory examination: Secondary | ICD-10-CM | POA: Diagnosis present

## 2021-03-10 NOTE — Telephone Encounter (Signed)
Requested Prescriptions  Pending Prescriptions Disp Refills  . pantoprazole (PROTONIX) 40 MG tablet [Pharmacy Med Name: PANTOPRAZOLE SOD DR 40 MG TAB] 90 tablet 0    Sig: TAKE 1 TABLET BY MOUTH EVERY DAY     Gastroenterology: Proton Pump Inhibitors Passed - 03/10/2021  1:14 AM      Passed - Valid encounter within last 12 months    Recent Outpatient Visits          4 months ago Essential hypertension   [facility] Jerrol Banana., MD   8 months ago Essential hypertension   [facility] Jerrol Banana., MD   11 months ago Irritable bowel syndrome, unspecified type   [facility] Jerrol Banana., MD   1 year ago Cough   [facility] Jerrol Banana., MD   1 year ago Encounter for annual physical exam   [facility] Jerrol Banana., MD      Future Appointments            In 2 weeks Jerrol Banana., MD [facility] [place]

## 2021-03-11 LAB — SARS CORONAVIRUS 2 (TAT 6-24 HRS): SARS Coronavirus 2: NEGATIVE

## 2021-03-14 ENCOUNTER — Encounter
Admission: RE | Disposition: A | Discharge status: Home / Self Care | Payer: Self-pay | Source: Home / Self Care | Attending: Ophthalmology

## 2021-03-14 ENCOUNTER — Ambulatory Visit: Payer: Medicare Other | Admitting: Anesthesiology

## 2021-03-14 ENCOUNTER — Encounter: Payer: Self-pay | Admitting: Ophthalmology

## 2021-03-14 ENCOUNTER — Other Ambulatory Visit: Payer: Self-pay

## 2021-03-14 ENCOUNTER — Ambulatory Visit
Admission: RE | Admit: 2021-03-14 | Discharge: 2021-03-14 | Disposition: A | Discharge status: Home / Self Care | Payer: Medicare Other | Attending: Ophthalmology | Admitting: Ophthalmology

## 2021-03-14 DIAGNOSIS — H2512 Age-related nuclear cataract, left eye: Secondary | ICD-10-CM | POA: Insufficient documentation

## 2021-03-14 DIAGNOSIS — Z882 Allergy status to sulfonamides status: Secondary | ICD-10-CM | POA: Insufficient documentation

## 2021-03-14 DIAGNOSIS — Z880 Allergy status to penicillin: Secondary | ICD-10-CM | POA: Insufficient documentation

## 2021-03-14 DIAGNOSIS — Z79899 Other long term (current) drug therapy: Secondary | ICD-10-CM | POA: Diagnosis not present

## 2021-03-14 DIAGNOSIS — Z8616 Personal history of COVID-19: Secondary | ICD-10-CM | POA: Insufficient documentation

## 2021-03-14 DIAGNOSIS — Z888 Allergy status to other drugs, medicaments and biological substances status: Secondary | ICD-10-CM | POA: Diagnosis not present

## 2021-03-14 DIAGNOSIS — Z7982 Long term (current) use of aspirin: Secondary | ICD-10-CM | POA: Diagnosis not present

## 2021-03-14 HISTORY — PX: CATARACT EXTRACTION W/PHACO: SHX586

## 2021-03-14 SURGERY — PHACOEMULSIFICATION, CATARACT, WITH IOL INSERTION
Anesthesia: Monitor Anesthesia Care | Site: Eye | Laterality: Left

## 2021-03-14 MED ORDER — NA CHONDROIT SULF-NA HYALURON 40-17 MG/ML IO SOLN
INTRAOCULAR | Status: DC | PRN
  Administered 2021-03-14: 1 mL via INTRAOCULAR

## 2021-03-14 MED ORDER — FENTANYL CITRATE (PF) 100 MCG/2ML IJ SOLN
INTRAMUSCULAR | Status: DC | PRN
  Administered 2021-03-14: 50 ug via INTRAVENOUS

## 2021-03-14 MED ORDER — EPINEPHRINE PF 1 MG/ML IJ SOLN
INTRAOCULAR | Status: DC | PRN
  Administered 2021-03-14: 64 mL via OPHTHALMIC

## 2021-03-14 MED ORDER — TETRACAINE HCL 0.5 % OP SOLN
1.0000 [drp] | OPHTHALMIC | Status: DC | PRN
  Administered 2021-03-14 (×3): 1 [drp] via OPHTHALMIC

## 2021-03-14 MED ORDER — MIDAZOLAM HCL 2 MG/2ML IJ SOLN
INTRAMUSCULAR | Status: DC | PRN
  Administered 2021-03-14: 1 mg via INTRAVENOUS

## 2021-03-14 MED ORDER — ACETAMINOPHEN 325 MG PO TABS: 325.0000 mg | ORAL_TABLET | Freq: Once | ORAL | Status: DC

## 2021-03-14 MED ORDER — ACETAMINOPHEN 160 MG/5ML PO SOLN: 325.0000 mg | Freq: Once | ORAL | Status: DC

## 2021-03-14 MED ORDER — LACTATED RINGERS IV SOLN: INTRAVENOUS | Status: DC

## 2021-03-14 MED ORDER — ARMC OPHTHALMIC DILATING DROPS
1.0000 "application " | OPHTHALMIC | Status: DC | PRN
  Administered 2021-03-14 (×3): 1 via OPHTHALMIC

## 2021-03-14 MED ORDER — BRIMONIDINE TARTRATE-TIMOLOL 0.2-0.5 % OP SOLN
OPHTHALMIC | Status: DC | PRN
  Administered 2021-03-14: 1 [drp] via OPHTHALMIC

## 2021-03-14 MED ORDER — MOXIFLOXACIN HCL 0.5 % OP SOLN
OPHTHALMIC | Status: DC | PRN
  Administered 2021-03-14: 0.2 mL via OPHTHALMIC

## 2021-03-14 MED ORDER — LIDOCAINE HCL (PF) 2 % IJ SOLN
INTRAOCULAR | Status: DC | PRN
  Administered 2021-03-14: 2 mL

## 2021-03-14 SURGICAL SUPPLY — 16 items
CANNULA ANT/CHMB 27GA (MISCELLANEOUS) ×4 IMPLANT
GLOVE SURG TRIUMPH 8.0 PF LTX (GLOVE) ×4 IMPLANT
GOWN STRL REUS W/ TWL LRG LVL3 (GOWN DISPOSABLE) ×2 IMPLANT
GOWN STRL REUS W/TWL LRG LVL3 (GOWN DISPOSABLE) ×4
LENS IOL ACRSF IQ ULTRA 23.5 (Intraocular Lens) ×1 IMPLANT
LENS IOL ACRYSOF IQ 23.5 (Intraocular Lens) ×2 IMPLANT
MARKER SKIN DUAL TIP RULER LAB (MISCELLANEOUS) ×2 IMPLANT
NEEDLE FILTER BLUNT 18X 1/2SAF (NEEDLE) ×1
NEEDLE FILTER BLUNT 18X1 1/2 (NEEDLE) ×1 IMPLANT
PACK EYE AFTER SURG (MISCELLANEOUS) ×2 IMPLANT
PACK OPTHALMIC (MISCELLANEOUS) ×2 IMPLANT
PACK PORFILIO (MISCELLANEOUS) ×2 IMPLANT
SYR 3ML LL SCALE MARK (SYRINGE) ×2 IMPLANT
SYR TB 1ML LUER SLIP (SYRINGE) ×2 IMPLANT
WATER STERILE IRR 250ML POUR (IV SOLUTION) ×2 IMPLANT
WIPE NON LINTING 3.25X3.25 (MISCELLANEOUS) ×2 IMPLANT

## 2021-03-14 NOTE — Anesthesia Preprocedure Evaluation (Signed)
Anesthesia Evaluation  Patient identified by MRN, date of birth, ID band Patient awake    Reviewed: Allergy & Precautions, H&P , NPO status , Patient's Chart, lab work & pertinent test results  Airway Mallampati: II  TM Distance: >3 FB Neck ROM: full    Dental no notable dental hx.    Pulmonary    Pulmonary exam normal breath sounds clear to auscultation       Cardiovascular hypertension, Normal cardiovascular exam Rhythm:regular Rate:Normal     Neuro/Psych    GI/Hepatic GERD  ,  Endo/Other    Renal/GU      Musculoskeletal   Abdominal   Peds  Hematology   Anesthesia Other Findings   Reproductive/Obstetrics                             Anesthesia Physical Anesthesia Plan  ASA: II  Anesthesia Plan: MAC   Post-op Pain Management:    Induction:   PONV Risk Score and Plan: 2 and Treatment may vary due to age or medical condition, Midazolam and TIVA  Airway Management Planned:   Additional Equipment:   Intra-op Plan:   Post-operative Plan:   Informed Consent: I have reviewed the patients History and Physical, chart, labs and discussed the procedure including the risks, benefits and alternatives for the proposed anesthesia with the patient or authorized representative who has indicated his/her understanding and acceptance.     Dental Advisory Given  Plan Discussed with: CRNA  Anesthesia Plan Comments:         Anesthesia Quick Evaluation

## 2021-03-14 NOTE — H&P (Signed)
[facility]   Primary Care Physician:  Jerrol Banana., MD Ophthalmologist: Dr. Benay Pillow  Pre-Procedure History & Physical: HPI:  Bianca Malone is a 86 y.o. female here for cataract surgery.   Past Medical History:  Diagnosis Date  . Actinic keratosis   . Arthritis   . Atypical mole 08/31/2008   L suprapubic  . COVID-19 01/2020  . GERD (gastroesophageal reflux disease)   . Hypertension   . Vaginal prolapse     Past Surgical History:  Procedure Laterality Date  . ABDOMINAL ADHESION SURGERY     small bowel lysis of adhesions  . BREAST CYST ASPIRATION Left   . CARPAL TUNNEL RELEASE    . COLONOSCOPY    . COLONOSCOPY WITH PROPOFOL N/A 07/22/2017   Procedure: COLONOSCOPY WITH PROPOFOL;  Surgeon: Manya Silvas, MD;  Location: [facility] ENDOSCOPY;  Service: Endoscopy;  Laterality: N/A;  . CYST EXCISION     thyroglossal duct cyst surgery  . ESOPHAGOGASTRODUODENOSCOPY (EGD) WITH PROPOFOL N/A 07/22/2017   Procedure: ESOPHAGOGASTRODUODENOSCOPY (EGD) WITH PROPOFOL;  Surgeon: Manya Silvas, MD;  Location: [facility] ENDOSCOPY;  Service: Endoscopy;  Laterality: N/A;  . HYSTEROSCOPY    . INCISION AND DRAINAGE / EXCISION THYROGLOSSAL CYST    . UPPER GASTROINTESTINAL ENDOSCOPY      Prior to Admission medications   Medication Sig Start Date End Date Taking? Authorizing Provider  aspirin 81 MG chewable tablet Chew by mouth.    Yes [provider]  calcium-vitamin D (OSCAL WITH D) 500-200 MG-UNIT tablet Take 1 tablet by mouth 2 (two) times daily.   Yes [provider]  hydrALAZINE (APRESOLINE) 25 MG tablet TAKE 1 TABLET BY MOUTH TWICE A DAY 01/01/21  Yes Jerrol Banana., MD  ibuprofen (ADVIL,MOTRIN) 600 MG tablet Take 1 tablet (600 mg total) by mouth every 6 (six) hours as needed. 01/02/16  Yes Jerrol Banana., MD  losartan (COZAAR) 100 MG tablet TAKE 1 TABLET BY MOUTH EVERY DAY 03/01/21  Yes Jerrol Banana., MD  pantoprazole (PROTONIX) 40 MG  tablet TAKE 1 TABLET BY MOUTH EVERY DAY 03/10/21  Yes Jerrol Banana., MD  polyethylene glycol [facility] Floria Raveling) packet Take 17 g by mouth daily as needed.   Yes [provider]  CALCIUM CARBONATE-VITAMIN D PO Take 2 tablets by mouth daily. Reported on 03/08/2016    [provider]  clidinium-chlordiazePOXIDE (LIBRAX) 5-2.5 MG capsule Take 1 capsule by mouth 2 (two) times daily as needed. Patient not taking: Reported on 03/08/2021 04/07/20   Jerrol Banana., MD  dicyclomine (BENTYL) 20 MG tablet Take 1 tablet (20 mg total) by mouth every 6 (six) hours as needed for spasms. Patient not taking: Reported on 03/08/2021 04/12/20   Jerrol Banana., MD  promethazine (PHENERGAN) 25 MG tablet Take 1 tablet (25 mg total) by mouth every 6 (six) hours as needed for nausea or vomiting. Patient not taking: Reported on 03/08/2021 11/14/20   Jerrol Banana., MD    Allergies as of 02/03/2021 - Review Complete 07/12/2020  Allergen Reaction Noted  . Amoxicillin Hives and Rash 08/19/2015  . Daypro  [oxaprozin] Rash 08/19/2015  . Sulfa antibiotics Rash 08/19/2015    Family History  Problem Relation Age of Onset  . Alzheimer's disease Mother   . Hypertension Mother   . Kidney failure Mother   . Hypertension Father   . CAD Father   . Heart disease Brother   . Anemia  Brother   . Heart disease Brother   . Myelodysplastic syndrome Brother   . Breast cancer Paternal Aunt   . Prostate cancer Paternal Uncle     Social History   Socioeconomic History  . Marital status: Married    Spouse name: Not on file  . Number of children: 2  . Years of education: Not on file  . Highest education level: Some college, no degree  Occupational History  . Not on file  Tobacco Use  . Smoking status: Never Smoker  . Smokeless tobacco: Never Used  Vaping Use  . Vaping Use: Never used  Substance and Sexual Activity  . Alcohol use: Yes    Alcohol/week: 0.0 - 3.0 standard drinks  .  Drug use: No  . Sexual activity: Yes    Birth control/protection: Condom  Other Topics Concern  . Not on file  Social History Narrative  . Not on file   Social Determinants of Health   Financial Resource Strain: Low Risk   . Difficulty of Paying Living Expenses: Not hard at all  Food Insecurity: No Food Insecurity  . Worried About Charity fundraiser in the Last Year: Never true  . Ran Out of Food in the Last Year: Never true  Transportation Needs: No Transportation Needs  . Lack of Transportation (Medical): No  . Lack of Transportation (Non-Medical): No  Physical Activity: Sufficiently Active  . Days of Exercise per Week: 4 days  . Minutes of Exercise per Session: 60 min  Stress: Stress Concern Present  . Feeling of Stress : To some extent  Social Connections: Moderately Integrated  . Frequency of Communication with Friends and Family: More than three times a week  . Frequency of Social Gatherings with Friends and Family: More than three times a week  . Attends Religious Services: Never  . Active Member of Clubs or Organizations: Yes  . Attends Archivist Meetings: More than 4 times per year  . Marital Status: Married  Human resources officer Violence: Not At Risk  . Fear of Current or Ex-Partner: No  . Emotionally Abused: No  . Physically Abused: No  . Sexually Abused: No    Review of Systems: See HPI, otherwise negative ROS  Physical Exam: BP (!) 177/69   Pulse 79   Temp (!) 96.5 F (35.8 C) (Temporal)   Ht 5\' 5"  (1.651 m)   Wt 58.8 kg   SpO2 99%   BMI 21.58 kg/m  General:   Alert,  pleasant and cooperative in NAD Head:  Normocephalic and atraumatic. Respiratory:  Normal work of breathing. Cardiovascular:  RRR  Impression/Plan: Bianca Malone is here for cataract surgery.  Risks, benefits, limitations, and alternatives regarding cataract surgery have been reviewed with the patient.  Questions have been answered.  All parties agreeable.   Birder Robson, MD  03/14/2021, 11:17 AM

## 2021-03-14 NOTE — Op Note (Signed)
PREOPERATIVE DIAGNOSIS:  Nuclear sclerotic cataract of the left eye.   POSTOPERATIVE DIAGNOSIS:  Nuclear sclerotic cataract of the left eye.   OPERATIVE PROCEDURE:@   SURGEON:  Birder Robson, MD.   ANESTHESIA:  Anesthesiologist: Ronelle Nigh, MD CRNA: Cameron Ali, CRNA  1.      Managed anesthesia care. 2.     0.49ml of Shugarcaine was instilled following the paracentesis   COMPLICATIONS:  None.   TECHNIQUE:   Stop and chop   DESCRIPTION OF PROCEDURE:  The patient was examined and consented in the preoperative holding area where the aforementioned topical anesthesia was applied to the left eye and then brought back to the Operating Room where the left eye was prepped and draped in the usual sterile ophthalmic fashion and a lid speculum was placed. A paracentesis was created with the side port blade and the anterior chamber was filled with viscoelastic. A near clear corneal incision was performed with the steel keratome. A continuous curvilinear capsulorrhexis was performed with a cystotome followed by the capsulorrhexis forceps. Hydrodissection and hydrodelineation were carried out with BSS on a blunt cannula. The lens was removed in a stop and chop  technique and the remaining cortical material was removed with the irrigation-aspiration handpiece. The capsular bag was inflated with viscoelastic and the Technis [id number] lens was placed in the capsular bag without complication. The remaining viscoelastic was removed from the eye with the irrigation-aspiration handpiece. The wounds were hydrated. The anterior chamber was flushed with BSS and the eye was inflated to physiologic pressure. 0.59ml Vigamox was placed in the anterior chamber. The wounds were found to be water tight. The eye was dressed with Combigan. The patient was given protective glasses to wear throughout the day and a shield with which to sleep tonight. The patient was also given drops with which to begin a drop regimen today and  will follow-up with me in one day. Implant Name Type Inv. Item Serial No. Manufacturer Lot No. LRB No. Used Action  LENS IOL ACRYSOF IQ 23.5 - [id number] Intraocular Lens LENS IOL ACRYSOF IQ 23.5 [id number] ALCON  Left 1 Implanted    Procedure(s): CATARACT EXTRACTION PHACO AND INTRAOCULAR LENS PLACEMENT (IOC) LEFT 9.46 01:01.2 (Left)  Electronically signed: Birder Robson 03/14/2021 11:44 AM

## 2021-03-14 NOTE — Discharge Instructions (Signed)

## 2021-03-14 NOTE — Anesthesia Postprocedure Evaluation (Signed)
Anesthesia Post Note  Patient: Bianca Malone  Procedure(s) Performed: CATARACT EXTRACTION PHACO AND INTRAOCULAR LENS PLACEMENT (IOC) LEFT 9.46 01:01.2 (Left Eye)     Patient location during evaluation: PACU Anesthesia Type: MAC Level of consciousness: awake and alert and oriented Pain management: satisfactory to patient Vital Signs Assessment: post-procedure vital signs reviewed and stable Respiratory status: spontaneous breathing, nonlabored ventilation and respiratory function stable Cardiovascular status: blood pressure returned to baseline and stable Postop Assessment: Adequate PO intake and No signs of nausea or vomiting Anesthetic complications: no   No complications documented.  Raliegh Ip

## 2021-03-14 NOTE — Transfer of Care (Signed)
Immediate Anesthesia Transfer of Care Note  Patient: Bianca Malone  Procedure(s) Performed: CATARACT EXTRACTION PHACO AND INTRAOCULAR LENS PLACEMENT (IOC) LEFT 9.46 01:01.2 (Left Eye)  Patient Location: PACU  Anesthesia Type: MAC  Level of Consciousness: awake, alert  and patient cooperative  Airway and Oxygen Therapy: Patient Spontanous Breathing and Patient connected to supplemental oxygen  Post-op Assessment: Post-op Vital signs reviewed, Patient's Cardiovascular Status Stable, Respiratory Function Stable, Patent Airway and No signs of Nausea or vomiting  Post-op Vital Signs: Reviewed and stable  Complications: No complications documented.

## 2021-03-15 ENCOUNTER — Encounter: Payer: Self-pay | Admitting: Ophthalmology

## 2021-03-27 ENCOUNTER — Ambulatory Visit (INDEPENDENT_AMBULATORY_CARE_PROVIDER_SITE_OTHER): Payer: Medicare Other | Admitting: Family Medicine

## 2021-03-27 ENCOUNTER — Other Ambulatory Visit: Payer: Self-pay

## 2021-03-27 ENCOUNTER — Encounter: Payer: Self-pay | Admitting: Family Medicine

## 2021-03-27 VITALS — BP 166/59 | HR 65 | Temp 98.0°F | Ht 65.0 in | Wt 130.8 lb

## 2021-03-27 DIAGNOSIS — Z13220 Encounter for screening for lipoid disorders: Secondary | ICD-10-CM | POA: Diagnosis not present

## 2021-03-27 DIAGNOSIS — Z0000 Encounter for general adult medical examination without abnormal findings: Secondary | ICD-10-CM | POA: Diagnosis not present

## 2021-03-27 DIAGNOSIS — M8580 Other specified disorders of bone density and structure, unspecified site: Secondary | ICD-10-CM

## 2021-03-27 DIAGNOSIS — M791 Myalgia, unspecified site: Secondary | ICD-10-CM

## 2021-03-27 DIAGNOSIS — I10 Essential (primary) hypertension: Secondary | ICD-10-CM

## 2021-03-27 NOTE — Patient Instructions (Signed)
Try mustard daily for leg cramps.

## 2021-03-27 NOTE — Progress Notes (Signed)
Annual Wellness Visit     Patient: Bianca Malone, Female    DOB: 04/20/1935, 86 y.o.   MRN: [id number] Visit Date: 03/27/2021  Today's Provider: Wilhemena Durie, MD   Chief Complaint  Patient presents with  . Annual Exam  . Hypertension  . Anxiety   I,Porsha C McClurkin,acting as a scribe for Wilhemena Durie, MD.,have documented all relevant documentation on the behalf of Wilhemena Durie, MD,as directed by  Wilhemena Durie, MD while in the presence of Wilhemena Durie, MD.  Subjective    Bianca Malone is a 86 y.o. female who presents today for her Annual Wellness Visit./Annual Physical. She reports consuming a general and low sodium diet. Home exercise routine includes walking, yard work and household cleaning. She generally feels well. She reports sleeping fairly well. She does have additional problems to discuss today.  He is coping fairly well since the death of her husband. He does have nocturnal cramps in her legs.  HPI Hypertension, follow-up  BP Readings from Last 3 Encounters:  03/27/21 (!) 166/59  03/14/21 130/72  10/12/20 (!) 175/60   Wt Readings from Last 3 Encounters:  03/27/21 130 lb 12.8 oz (59.3 kg)  03/14/21 129 lb 11.2 oz (58.8 kg)  10/12/20 131 lb (59.4 kg)     She was last seen for hypertension 5 months ago.  BP at that visit was 175/60. Management since that visit includes continue current medication.  She reports good compliance with treatment. She is not having side effects.  She is following a Regular, Low Sodium diet. She is exercising. She does not smoke.  Use of agents associated with hypertension: none.   Outside blood pressures are arranging in 120's-130's/50's-60's. Symptoms: No chest pain No chest pressure  No palpitations No syncope  No dyspnea No orthopnea  No paroxysmal nocturnal dyspnea No lower extremity edema   Pertinent labs: Lab Results  Component Value Date   CHOL 194 10/13/2019   HDL 90 10/13/2019    LDLCALC 90 10/13/2019   TRIG 76 10/13/2019   CHOLHDL 2.2 10/13/2019   Lab Results  Component Value Date   NA 135 10/12/2020   K 4.1 10/12/2020   CREATININE 0.87 10/12/2020   GFRNONAA 62 10/12/2020   GFRAA 71 10/12/2020   GLUCOSE 94 10/12/2020     The ASCVD Risk score (Goff DC Jr., et al., 2013) failed to calculate for the following reasons:   The 2013 ASCVD risk score is only valid for ages 73 to 43   --------------------------------------------------------------------------------------------------- Anxiety, Follow-up  She was last seen for anxiety 5 months ago. Changes made at last visit include continue current medication.   She reports good compliance with treatment. She reports good tolerance of treatment. She is not having side effects.   She feels her anxiety is moderate and Improved since last visit.  Symptoms: No chest pain No difficulty concentrating  No dizziness No fatigue  No feelings of losing control No insomnia  No irritable No palpitations  No panic attacks No racing thoughts  No shortness of breath No sweating  No tremors/shakes    GAD-7 Results GAD-7 Generalized Anxiety Disorder Screening Tool 03/27/2021 07/12/2020  1. Feeling Nervous, Anxious, or on Edge 0 1  2. Not Being Able to Stop or Control Worrying 0 1  3. Worrying Too Much About Different Things 0 1  4. Trouble Relaxing 0 0  5. Being So Restless it's Hard To Sit Still 0 0  6.  Becoming Easily Annoyed or Irritable 0 0  7. Feeling Afraid As If Something Awful Might Happen 0 1  Total GAD-7 Score 0 4  Difficulty At Work, Home, or Getting  Along With Others? Not difficult at all Not difficult at all    PHQ-9 Scores PHQ9 SCORE ONLY 03/27/2021 07/12/2020 04/19/2020  PHQ-9 Total Score 1 0 0    ---------------------------------------------------------------------------------------------------       Medications: Outpatient Medications Prior to Visit  Medication Sig  . aspirin 81 MG chewable  tablet Chew by mouth.   Marland Kitchen CALCIUM CARBONATE-VITAMIN D PO Take 2 tablets by mouth daily. Reported on 03/08/2016  . dicyclomine (BENTYL) 20 MG tablet Take 1 tablet (20 mg total) by mouth every 6 (six) hours as needed for spasms.  Marland Kitchen ibuprofen (ADVIL,MOTRIN) 600 MG tablet Take 1 tablet (600 mg total) by mouth every 6 (six) hours as needed.  Marland Kitchen losartan (COZAAR) 100 MG tablet TAKE 1 TABLET BY MOUTH EVERY DAY  . pantoprazole (PROTONIX) 40 MG tablet TAKE 1 TABLET BY MOUTH EVERY DAY  . polyethylene glycol (MIRALAX / GLYCOLAX) packet Take 17 g by mouth daily as needed.  . calcium-vitamin D (OSCAL WITH D) 500-200 MG-UNIT tablet Take 1 tablet by mouth 2 (two) times daily.  . clidinium-chlordiazePOXIDE (LIBRAX) 5-2.5 MG capsule Take 1 capsule by mouth 2 (two) times daily as needed. (Patient not taking: No sig reported)  . hydrALAZINE (APRESOLINE) 25 MG tablet TAKE 1 TABLET BY MOUTH TWICE A DAY  . promethazine (PHENERGAN) 25 MG tablet Take 1 tablet (25 mg total) by mouth every 6 (six) hours as needed for nausea or vomiting. (Patient not taking: No sig reported)   No facility-administered medications prior to visit.    Allergies  Allergen Reactions  . Amoxicillin Hives and Rash       . Daypro  [Oxaprozin] Rash  . Sulfa Antibiotics Rash    presuptive reaction    Patient Care Team: Jerrol Banana., MD as PCP - General (Family Medicine) Benjaman Kindler, MD as Consulting Physician (Obstetrics and Gynecology) Birder Robson, MD as Referring Physician (Ophthalmology) Beverly Gust, MD as Consulting Physician (Otolaryngology) Ralene Bathe, MD (Dermatology)  Review of Systems  Constitutional: Negative.   HENT: Negative.   Eyes: Negative.   Respiratory: Negative.   Cardiovascular: Negative.   Gastrointestinal: Negative.   Endocrine: Negative.   Genitourinary: Negative.   Musculoskeletal: Negative.   Skin: Negative.   Allergic/Immunologic: Negative.   Neurological: Negative.    Hematological: Negative.   Psychiatric/Behavioral: Negative.          Objective    Vitals: BP (!) 166/59 (BP Location: Left Arm, Patient Position: Sitting, Cuff Size: Normal)   Pulse 65   Temp 98 F (36.7 C) (Oral)   Ht 5\' 5"  (1.651 m)   Wt 130 lb 12.8 oz (59.3 kg)   SpO2 99%   BMI 21.77 kg/m  BP Readings from Last 3 Encounters:  03/27/21 (!) 166/59  03/14/21 130/72  10/12/20 (!) 175/60   Wt Readings from Last 3 Encounters:  03/27/21 130 lb 12.8 oz (59.3 kg)  03/14/21 129 lb 11.2 oz (58.8 kg)  10/12/20 131 lb (59.4 kg)      Physical Exam Vitals reviewed.  Constitutional:      Appearance: Normal appearance. She is well-developed.     Comments: Patient appears younger than her age of 76.  HENT:     Head: Normocephalic and atraumatic.     Right Ear: Tympanic membrane and external ear  normal.     Left Ear: Tympanic membrane and external ear normal.  Eyes:     General: No scleral icterus.    Conjunctiva/sclera: Conjunctivae normal.  Neck:     Thyroid: No thyromegaly.     Vascular: No carotid bruit.  Cardiovascular:     Rate and Rhythm: Normal rate and regular rhythm.     Heart sounds: Normal heart sounds.  Pulmonary:     Effort: Pulmonary effort is normal.     Breath sounds: Normal breath sounds.  Abdominal:     Palpations: Abdomen is soft.  Musculoskeletal:     Right lower leg: No edema.     Left lower leg: No edema.  Lymphadenopathy:     Cervical: No cervical adenopathy.  Skin:    General: Skin is warm and dry.  Neurological:     General: No focal deficit present.     Mental Status: She is alert and oriented to person, place, and time.  Psychiatric:        Mood and Affect: Mood normal.        Behavior: Behavior normal.        Thought Content: Thought content normal.        Judgment: Judgment normal.      Most recent functional status assessment: In your present state of health, do you have any difficulty performing the following activities:  03/27/2021  Hearing? Y  Comment -  Vision? N  Difficulty concentrating or making decisions? N  Walking or climbing stairs? N  Dressing or bathing? N  Doing errands, shopping? N  Preparing Food and eating ? -  Using the Toilet? -  In the past six months, have you accidently leaked urine? -  Do you have problems with loss of bowel control? -  Managing your Medications? -  Managing your Finances? -  Housekeeping or managing your Housekeeping? -  Some recent data might be hidden   Most recent fall risk assessment: Fall Risk  03/27/2021  Falls in the past year? 0  Number falls in past yr: 0  Injury with Fall? 0  Risk for fall due to : No Fall Risks  Follow up Falls evaluation completed    Most recent depression screenings: PHQ 2/9 Scores 03/27/2021 07/12/2020  PHQ - 2 Score 0 0  PHQ- 9 Score 1 0   Most recent cognitive screening: 6CIT Screen 11/29/2016  What Year? 0 points  What month? 0 points  What time? 0 points  Count back from 20 0 points  Months in reverse 0 points  Repeat phrase 2 points  Total Score 2   Most recent Audit-C alcohol use screening Alcohol Use Disorder Test (AUDIT) 03/27/2021  1. How often do you have a drink containing alcohol? 1  2. How many drinks containing alcohol do you have on a typical day when you are drinking? 0  3. How often do you have six or more drinks on one occasion? 0  AUDIT-C Score 1  Alcohol Brief Interventions/Follow-up -   A score of 3 or more in women, and 4 or more in men indicates increased risk for alcohol abuse, EXCEPT if all of the points are from question 1   No results found for any visits on 03/27/21.  Assessment & Plan     Annual wellness visit done today including the all of the following: Reviewed patient's Family Medical History Reviewed and updated list of patient's medical providers Assessment of cognitive impairment was done Assessed patient's functional  ability Established a written schedule for health  screening [place] Completed and Reviewed  Exercise Activities and Dietary recommendations Goals    . DIET - EAT MORE VEGETABLES     Recommend to eat 2 servings of vegetables every day.        Immunization History  Administered Date(s) Administered  . Fluad Quad(high Dose 65+) 09/02/2019, 10/12/2020  . Influenza Split 11/04/2012  . Influenza, High Dose Seasonal PF 11/16/2014, 08/22/2015, 09/29/2016, 09/05/2017, 09/16/2018  . Influenza,inj,Quad PF,6+ Mos 10/14/2013  . Moderna Sars-Covid-2 Vaccination 12/15/2019, 01/12/2020, 09/24/2020  . Pneumococcal Conjugate-13 11/16/2014  . Pneumococcal Polysaccharide-23 03/13/2011  . Td 03/30/2004, 04/07/2019    Health Maintenance  Topic Date Due  . DEXA SCAN  03/01/2020  . COVID-19 Vaccine (4 - Booster for Moderna series) 03/25/2021  . INFLUENZA VACCINE  07/03/2021  . TETANUS/TDAP  04/06/2029  . PNA vac Low Risk Adult  Completed  . HPV VACCINES  Aged Out     Discussed health benefits of physical activity, and encouraged her to engage in regular exercise appropriate for her age and condition.    1. Encounter for Medicare annual wellness exam Doing well.  2. Annual physical exam Patient declines pelvic or breast exam.  3. Essential hypertension [place] hypertension.  Follow-up for home readings. - TSH - Comprehensive metabolic panel - CBC with Differential/Platelet  4. Screening for lipid disorders  - TSH - Lipid panel  5. Myalgia Try mustard in addition to magnesium. - CK  6. Osteopenia, unspecified location Slender white female.  Obtain bone density. - DG Bone Density   No follow-ups on file.     I, Wilhemena Durie, MD, have reviewed all documentation for this visit. The documentation on 04/01/21 for the exam, diagnosis, procedures, and orders are all accurate and complete.    Sheehan Stacey Cranford Mon, MD  [facility] [phone] (phone) [phone] (fax)  [place]

## 2021-03-28 LAB — CBC WITH DIFFERENTIAL/PLATELET
Basophils Absolute: 0.1 10*3/uL (ref 0.0–0.2)
Basos: 1 %
EOS (ABSOLUTE): 0.3 10*3/uL (ref 0.0–0.4)
Eos: 4 %
Hematocrit: 37.8 % (ref 34.0–46.6)
Hemoglobin: 12.8 g/dL (ref 11.1–15.9)
Immature Grans (Abs): 0 10*3/uL (ref 0.0–0.1)
Immature Granulocytes: 0 %
Lymphocytes Absolute: 1.2 10*3/uL (ref 0.7–3.1)
Lymphs: 17 %
MCH: 30.8 pg (ref 26.6–33.0)
MCHC: 33.9 g/dL (ref 31.5–35.7)
MCV: 91 fL (ref 79–97)
Monocytes Absolute: 0.7 10*3/uL (ref 0.1–0.9)
Monocytes: 9 %
Neutrophils Absolute: 4.9 10*3/uL (ref 1.4–7.0)
Neutrophils: 69 %
Platelets: 243 10*3/uL (ref 150–450)
RBC: 4.16 x10E6/uL (ref 3.77–5.28)
RDW: 12.3 % (ref 11.7–15.4)
WBC: 7.1 10*3/uL (ref 3.4–10.8)

## 2021-03-28 LAB — COMPREHENSIVE METABOLIC PANEL
ALT: 13 IU/L (ref 0–32)
AST: 25 IU/L (ref 0–40)
Albumin/Globulin Ratio: 1.9 (ref 1.2–2.2)
Albumin: 4.8 g/dL — ABNORMAL HIGH (ref 3.6–4.6)
Alkaline Phosphatase: 89 IU/L (ref 44–121)
BUN/Creatinine Ratio: 29 — ABNORMAL HIGH (ref 12–28)
BUN: 24 mg/dL (ref 8–27)
Bilirubin Total: 0.9 mg/dL (ref 0.0–1.2)
CO2: 21 mmol/L (ref 20–29)
Calcium: 10.3 mg/dL (ref 8.7–10.3)
Chloride: 96 mmol/L (ref 96–106)
Creatinine, Ser: 0.83 mg/dL (ref 0.57–1.00)
Globulin, Total: 2.5 g/dL (ref 1.5–4.5)
Glucose: 88 mg/dL (ref 65–99)
Potassium: 4.5 mmol/L (ref 3.5–5.2)
Sodium: 136 mmol/L (ref 134–144)
Total Protein: 7.3 g/dL (ref 6.0–8.5)
eGFR: 69 mL/min/{1.73_m2} (ref >59)

## 2021-03-28 LAB — LIPID PANEL
Chol/HDL Ratio: 1.8 ratio (ref 0.0–4.4)
Cholesterol, Total: 178 mg/dL (ref 100–199)
HDL: 99 mg/dL (ref >39)
LDL Chol Calc (NIH): 66 mg/dL (ref 0–99)
Triglycerides: 66 mg/dL (ref 0–149)
VLDL Cholesterol Cal: 13 mg/dL (ref 5–40)

## 2021-03-28 LAB — TSH: TSH: 2.1 u[IU]/mL (ref 0.450–4.500)

## 2021-03-28 LAB — CK: Total CK: 88 U/L (ref 26–161)

## 2021-04-05 ENCOUNTER — Other Ambulatory Visit: Payer: Medicare Other

## 2021-04-14 ENCOUNTER — Other Ambulatory Visit: Payer: Self-pay | Admitting: Family Medicine

## 2021-04-14 DIAGNOSIS — K219 Gastro-esophageal reflux disease without esophagitis: Secondary | ICD-10-CM

## 2021-04-24 ENCOUNTER — Ambulatory Visit
Admission: RE | Admit: 2021-04-24 | Discharge: 2021-04-24 | Disposition: A | Discharge status: Home / Self Care | Payer: Medicare Other | Source: Ambulatory Visit | Attending: Family Medicine | Admitting: Family Medicine

## 2021-04-24 ENCOUNTER — Other Ambulatory Visit: Payer: Self-pay

## 2021-04-24 DIAGNOSIS — M810 Age-related osteoporosis without current pathological fracture: Secondary | ICD-10-CM | POA: Insufficient documentation

## 2021-04-24 DIAGNOSIS — M8580 Other specified disorders of bone density and structure, unspecified site: Secondary | ICD-10-CM | POA: Insufficient documentation

## 2021-04-27 ENCOUNTER — Ambulatory Visit (INDEPENDENT_AMBULATORY_CARE_PROVIDER_SITE_OTHER): Payer: Medicare Other | Admitting: Dermatology

## 2021-04-27 ENCOUNTER — Encounter: Payer: Self-pay | Admitting: Dermatology

## 2021-04-27 ENCOUNTER — Other Ambulatory Visit: Payer: Self-pay

## 2021-04-27 DIAGNOSIS — D229 Melanocytic nevi, unspecified: Secondary | ICD-10-CM

## 2021-04-27 DIAGNOSIS — Z86018 Personal history of other benign neoplasm: Secondary | ICD-10-CM

## 2021-04-27 DIAGNOSIS — D1800 Hemangioma unspecified site: Secondary | ICD-10-CM

## 2021-04-27 DIAGNOSIS — L820 Inflamed seborrheic keratosis: Secondary | ICD-10-CM

## 2021-04-27 DIAGNOSIS — L814 Other melanin hyperpigmentation: Secondary | ICD-10-CM

## 2021-04-27 DIAGNOSIS — L720 Epidermal cyst: Secondary | ICD-10-CM

## 2021-04-27 DIAGNOSIS — L821 Other seborrheic keratosis: Secondary | ICD-10-CM

## 2021-04-27 DIAGNOSIS — L570 Actinic keratosis: Secondary | ICD-10-CM

## 2021-04-27 DIAGNOSIS — L578 Other skin changes due to chronic exposure to nonionizing radiation: Secondary | ICD-10-CM | POA: Diagnosis not present

## 2021-04-27 DIAGNOSIS — Z1283 Encounter for screening for malignant neoplasm of skin: Secondary | ICD-10-CM | POA: Diagnosis not present

## 2021-04-27 NOTE — Patient Instructions (Signed)

## 2021-04-27 NOTE — Progress Notes (Signed)
Follow-Up Visit   Subjective  Bianca Malone is a 86 y.o. female who presents for the following: Annual Exam (Hx dysplastic nevus - L supra pubic). She has noticed a lesion on her scalp, chest, and nose that she is concerned about and would like checked today. The patient presents for Total-Body Skin Exam (TBSE) for skin cancer screening and mole check.  The following portions of the chart were reviewed this encounter and updated as appropriate:   Tobacco  Allergies  Meds  Problems  Med Hx  Surg Hx  Fam Hx     Review of Systems:  No other skin or systemic complaints except as noted in HPI or Assessment and Plan.  Objective  Well appearing patient in no apparent distress; mood and affect are within normal limits.  A full examination was performed including scalp, head, eyes, ears, nose, lips, neck, chest, axillae, abdomen, back, buttocks, bilateral upper extremities, bilateral lower extremities, hands, feet, fingers, toes, fingernails, and toenails. All findings within normal limits unless otherwise noted below.  Objective  Scalp: Firm white papule  Objective  L nose x 1, chest x 2 (3): Erythematous keratotic or waxy stuck-on papule or plaque.   Objective  Chest x 3, R calf x 1 (4): Erythematous keratotic or waxy stuck-on papule or plaque.   Assessment & Plan  [place] Scalp  Benign-appearing.  Observation.  Call clinic for new or changing lesions.  Recommend daily use of broad spectrum spf 30+ sunscreen to sun-exposed areas.   Actinic keratosis (3) L nose x 1, chest x 2  Destruction of lesion - L nose x 1, chest x 2 Complexity: simple   Destruction method: cryotherapy   Informed consent: discussed and consent obtained   Timeout:  patient name, date of birth, surgical site, and procedure verified Lesion destroyed using liquid nitrogen: Yes   Region frozen until ice ball extended beyond lesion: Yes   Outcome: patient tolerated procedure well with no complications    Post-procedure details: wound care instructions given    Inflamed seborrheic keratosis (4) Chest x 3, R calf x 1  Destruction of lesion - Chest x 3, R calf x 1 Complexity: simple   Destruction method: cryotherapy   Informed consent: discussed and consent obtained   Timeout:  patient name, date of birth, surgical site, and procedure verified Lesion destroyed using liquid nitrogen: Yes   Region frozen until ice ball extended beyond lesion: Yes   Outcome: patient tolerated procedure well with no complications   Post-procedure details: wound care instructions given     Lentigines - Scattered tan macules - Due to sun exposure - Benign-appering, observe - Recommend daily broad spectrum sunscreen SPF 30+ to sun-exposed areas, reapply every 2 hours as needed. - Call for any changes  Seborrheic Keratoses - Stuck-on, waxy, tan-brown papules and/or plaques  - Benign-appearing - Discussed benign etiology and prognosis. - Observe - Call for any changes  Melanocytic Nevi - Tan-brown and/or pink-flesh-colored symmetric macules and papules - Benign appearing on exam today - Observation - Call clinic for new or changing moles - Recommend daily use of broad spectrum spf 30+ sunscreen to sun-exposed areas.   Hemangiomas - Red papules - Discussed benign nature - Observe - Call for any changes  Actinic Damage - Chronic condition, secondary to cumulative UV/sun exposure - diffuse scaly erythematous macules with underlying dyspigmentation - Recommend daily broad spectrum sunscreen SPF 30+ to sun-exposed areas, reapply every 2 hours as needed.  - Staying in the  shade or wearing long sleeves, sun glasses (UVA+UVB protection) and wide brim hats (4-inch brim around the entire circumference of the hat) are also recommended for sun protection.  - Call for new or changing lesions.  History of Dysplastic Nevus - L supra pubic  - No evidence of recurrence today - Recommend regular full body skin  exams - Recommend daily broad spectrum sunscreen SPF 30+ to sun-exposed areas, reapply every 2 hours as needed.  - Call if any new or changing lesions are noted between office visits  Skin cancer screening performed today.  Return in about 1 year (around 04/27/2022) for TBSE.  Luther Redo, CMA, am acting as scribe for Sarina Ser, MD .  Documentation: I have reviewed the above documentation for accuracy and completeness, and I agree with the above.  Sarina Ser, MD

## 2021-05-01 ENCOUNTER — Encounter: Payer: Self-pay | Admitting: Dermatology

## 2021-05-27 ENCOUNTER — Other Ambulatory Visit: Payer: Self-pay | Admitting: Family Medicine

## 2021-05-27 DIAGNOSIS — I10 Essential (primary) hypertension: Secondary | ICD-10-CM

## 2021-07-09 ENCOUNTER — Other Ambulatory Visit: Payer: Self-pay | Admitting: Family Medicine

## 2021-07-09 DIAGNOSIS — I10 Essential (primary) hypertension: Secondary | ICD-10-CM

## 2021-07-09 NOTE — Telephone Encounter (Signed)
Requested Prescriptions  Pending Prescriptions Disp Refills  . hydrALAZINE (APRESOLINE) 25 MG tablet [Pharmacy Med Name: HYDRALAZINE 25 MG TABLET] 180 tablet 1    Sig: TAKE 1 TABLET BY MOUTH TWICE A DAY     Cardiovascular:  Vasodilators Failed - 07/09/2021 12:08 PM      Failed - Last BP in normal range    BP Readings from Last 1 Encounters:  03/27/21 (!) 166/59         Passed - HCT in normal range and within 360 days    Hematocrit  Date Value Ref Range Status  03/27/2021 37.8 34.0 - 46.6 % Final         Passed - HGB in normal range and within 360 days    Hemoglobin  Date Value Ref Range Status  03/27/2021 12.8 11.1 - 15.9 g/dL Final         Passed - RBC in normal range and within 360 days    RBC  Date Value Ref Range Status  03/27/2021 4.16 3.77 - 5.28 x10E6/uL Final  02/26/2018 4.71 3.80 - 5.20 MIL/uL Final         Passed - WBC in normal range and within 360 days    WBC  Date Value Ref Range Status  03/27/2021 7.1 3.4 - 10.8 x10E3/uL Final  02/26/2018 8.3 3.6 - 11.0 K/uL Final         Passed - PLT in normal range and within 360 days    Platelets  Date Value Ref Range Status  03/27/2021 243 150 - 450 x10E3/uL Final         Passed - Valid encounter within last 12 months    Recent Outpatient Visits          3 months ago Encounter for [employer] annual wellness exam   [facility] Jerrol Banana., MD   9 months ago Essential hypertension   [facility] Jerrol Banana., MD   12 months ago Essential hypertension   [facility] Jerrol Banana., MD   1 year ago Irritable bowel syndrome, unspecified type   [facility] Jerrol Banana., MD   1 year ago Cough   [facility] Jerrol Banana., MD      Future Appointments            In 2 months Jerrol Banana., MD [facility] [place]

## 2021-08-23 ENCOUNTER — Other Ambulatory Visit: Payer: Self-pay | Admitting: Family Medicine

## 2021-08-23 DIAGNOSIS — I10 Essential (primary) hypertension: Secondary | ICD-10-CM

## 2021-09-09 ENCOUNTER — Other Ambulatory Visit: Payer: Self-pay | Admitting: Family Medicine

## 2021-09-09 DIAGNOSIS — K219 Gastro-esophageal reflux disease without esophagitis: Secondary | ICD-10-CM

## 2021-09-09 NOTE — Telephone Encounter (Signed)
Requested Prescriptions  Pending Prescriptions Disp Refills  . pantoprazole (PROTONIX) 40 MG tablet [Pharmacy Med Name: PANTOPRAZOLE SOD DR 40 MG TAB] 30 tablet 0    Sig: TAKE 1 TABLET BY MOUTH EVERY DAY     Gastroenterology: Proton Pump Inhibitors Passed - 09/09/2021  9:14 AM      Passed - Valid encounter within last 12 months    Recent Outpatient Visits          5 months ago Encounter for [employer] annual wellness exam   [facility] Jerrol Banana., MD   11 months ago Essential hypertension   [facility] Jerrol Banana., MD   1 year ago Essential hypertension   [facility] Jerrol Banana., MD   1 year ago Irritable bowel syndrome, unspecified type   [facility] Jerrol Banana., MD   1 year ago Cough   [facility] Jerrol Banana., MD      Future Appointments            In 2 weeks Jerrol Banana., MD [facility] [place]

## 2021-09-19 ENCOUNTER — Telehealth: Payer: Self-pay

## 2021-09-19 NOTE — Telephone Encounter (Signed)
Copied from [place] [id number]. Topic: General - Other >> Aug 31, 2021 10:04 AM Bayard Beaver wrote: Reason for CRM: Patient wants advice on whether to get a flu shot or booster. Please call back

## 2021-09-20 NOTE — Telephone Encounter (Signed)
Patient was advised okay to get shots.

## 2021-09-26 ENCOUNTER — Ambulatory Visit (INDEPENDENT_AMBULATORY_CARE_PROVIDER_SITE_OTHER): Payer: Medicare Other | Admitting: Family Medicine

## 2021-09-26 ENCOUNTER — Other Ambulatory Visit: Payer: Self-pay

## 2021-09-26 VITALS — BP 142/54 | HR 71 | Temp 97.6°F | Wt 129.0 lb

## 2021-09-26 DIAGNOSIS — Z23 Encounter for immunization: Secondary | ICD-10-CM | POA: Diagnosis not present

## 2021-09-26 DIAGNOSIS — K589 Irritable bowel syndrome without diarrhea: Secondary | ICD-10-CM | POA: Diagnosis not present

## 2021-09-26 DIAGNOSIS — I10 Essential (primary) hypertension: Secondary | ICD-10-CM

## 2021-09-26 DIAGNOSIS — K449 Diaphragmatic hernia without obstruction or gangrene: Secondary | ICD-10-CM

## 2021-09-26 DIAGNOSIS — K219 Gastro-esophageal reflux disease without esophagitis: Secondary | ICD-10-CM

## 2021-09-26 DIAGNOSIS — R350 Frequency of micturition: Secondary | ICD-10-CM

## 2021-09-26 DIAGNOSIS — F419 Anxiety disorder, unspecified: Secondary | ICD-10-CM

## 2021-09-26 LAB — POCT URINALYSIS DIPSTICK
Bilirubin, UA: NEGATIVE
Blood, UA: NEGATIVE
Glucose, UA: NEGATIVE
Ketones, UA: NEGATIVE
Leukocytes, UA: NEGATIVE
Nitrite, UA: NEGATIVE
Protein, UA: NEGATIVE
Spec Grav, UA: 1.02 (ref 1.010–1.025)
Urobilinogen, UA: 0.2 E.U./dL
pH, UA: 6 (ref 5.0–8.0)

## 2021-09-26 MED ORDER — HYDRALAZINE HCL 25 MG PO TABS: 25.0000 mg | ORAL_TABLET | Freq: Two times a day (BID) | ORAL | 0 refills | Status: DC

## 2021-09-26 MED ORDER — PROMETHAZINE HCL 25 MG PO TABS
25.0000 mg | ORAL_TABLET | Freq: Four times a day (QID) | ORAL | 3 refills | Status: AC | PRN
Start: 2021-09-26 — End: ?

## 2021-09-26 MED ORDER — PANTOPRAZOLE SODIUM 40 MG PO TBEC: 40.0000 mg | DELAYED_RELEASE_TABLET | Freq: Every day | ORAL | 0 refills | Status: DC

## 2021-09-26 MED ORDER — DICYCLOMINE HCL 20 MG PO TABS: 20.0000 mg | ORAL_TABLET | Freq: Four times a day (QID) | ORAL | 3 refills | Status: DC | PRN

## 2021-09-26 MED ORDER — LOSARTAN POTASSIUM 100 MG PO TABS: 100.0000 mg | ORAL_TABLET | Freq: Every day | ORAL | 0 refills | Status: DC

## 2021-09-26 NOTE — Progress Notes (Signed)
Established patient visit   Patient: Bianca Malone   DOB: 24-Jun-1935   86 y.o. Female  MRN: [id number] Visit Date: 09/26/2021  Today's healthcare provider: Wilhemena Durie, MD   No chief complaint on file.  Subjective    HPI  Patient is a pleasant 86 year old who was widowed last year.  She lives independently and is doing fairly well.  Brings in home blood pressures which are always been in the 1 10-1 30 range over 50s to 60s. The patient needs refills on her medications for hypertension and reflux. Issue today is 1 of recurrent problems with ongoing crampy abdominal pain.  It seems to be a little bit worse recently.  She has been seen multiple times and managed by GI.  She is on dicyclomine for this by GI.  He had full work-up by Dr. Vira Agar with EGD and colonoscopy multiple office evaluations. Hypertension, follow-up  BP Readings from Last 3 Encounters:  09/26/21 (!) 142/54  03/27/21 (!) 166/59  03/14/21 130/72   Wt Readings from Last 3 Encounters:  09/26/21 129 lb (58.5 kg)  03/27/21 130 lb 12.8 oz (59.3 kg)  03/14/21 129 lb 11.2 oz (58.8 kg)     She was last seen for hypertension 6 months ago.  BP at that visit was as above. Management since that visit includes none.  She reports good compliance with treatment. She is not having side effects.  She is following a Regular diet. She is exercising. She does not smoke.  Use of agents associated with hypertension: none.   Outside blood pressures readings range 100's-130's over 50's-60's. Symptoms: No chest pain No chest pressure  No palpitations No syncope  No dyspnea No orthopnea  No paroxysmal nocturnal dyspnea No lower extremity edema   Pertinent labs: Lab Results  Component Value Date   CHOL 178 03/27/2021   HDL 99 03/27/2021   LDLCALC 66 03/27/2021   TRIG 66 03/27/2021   CHOLHDL 1.8 03/27/2021   Lab Results  Component Value Date   NA 136 03/27/2021   K 4.5 03/27/2021   CREATININE 0.83 03/27/2021    EGFR 69 03/27/2021   GLUCOSE 88 03/27/2021   TSH 2.100 03/27/2021     The ASCVD Risk score (Arnett DK, et al., 2019) failed to calculate for the following reasons:   The 2019 ASCVD risk score is only valid for ages 83 to 41   ---------------------------------------------------------------------------------------------------     Medications: Outpatient Medications Prior to Visit  Medication Sig   aspirin 81 MG chewable tablet Chew by mouth.    CALCIUM CARBONATE-VITAMIN D PO Take 2 tablets by mouth daily. Reported on 03/08/2016   dicyclomine (BENTYL) 20 MG tablet Take 1 tablet (20 mg total) by mouth every 6 (six) hours as needed for spasms.   hydrALAZINE (APRESOLINE) 25 MG tablet TAKE 1 TABLET BY MOUTH TWICE A DAY   ibuprofen (ADVIL,MOTRIN) 600 MG tablet Take 1 tablet (600 mg total) by mouth every 6 (six) hours as needed.   losartan (COZAAR) 100 MG tablet TAKE 1 TABLET BY MOUTH EVERY DAY   pantoprazole (PROTONIX) 40 MG tablet TAKE 1 TABLET BY MOUTH EVERY DAY   polyethylene glycol (MIRALAX / GLYCOLAX) packet Take 17 g by mouth daily as needed.   promethazine (PHENERGAN) 25 MG tablet Take 1 tablet (25 mg total) by mouth every 6 (six) hours as needed for nausea or vomiting.   clidinium-chlordiazePOXIDE (LIBRAX) 5-2.5 MG capsule Take 1 capsule by mouth 2 (two) times daily  as needed.   [DISCONTINUED] calcium-vitamin D (OSCAL WITH D) 500-200 MG-UNIT tablet Take 1 tablet by mouth 2 (two) times daily.   No facility-administered medications prior to visit.    Review of Systems      Objective    BP (!) 142/54 (BP Location: Right Arm, Patient Position: Sitting, Cuff Size: Normal)   Pulse 71   Temp 97.6 F (36.4 C) (Oral)   Wt 129 lb (58.5 kg)   SpO2 98%   BMI 21.47 kg/m  BP Readings from Last 3 Encounters:  09/26/21 (!) 142/54  03/27/21 (!) 166/59  03/14/21 130/72   Wt Readings from Last 3 Encounters:  09/26/21 129 lb (58.5 kg)  03/27/21 130 lb 12.8 oz (59.3 kg)  03/14/21 129 lb  11.2 oz (58.8 kg)      Physical Exam Vitals reviewed.  Constitutional:      Appearance: Normal appearance. She is well-developed.     Comments: Patient appears younger than her age of 86.  HENT:     Head: Normocephalic and atraumatic.     Right Ear: Tympanic membrane and external ear normal.     Left Ear: Tympanic membrane and external ear normal.  Eyes:     General: No scleral icterus.    Conjunctiva/sclera: Conjunctivae normal.  Neck:     Thyroid: No thyromegaly.     Vascular: No carotid bruit.  Cardiovascular:     Rate and Rhythm: Normal rate and regular rhythm.     Heart sounds: Normal heart sounds.  Pulmonary:     Effort: Pulmonary effort is normal.     Breath sounds: Normal breath sounds.  Abdominal:     Palpations: Abdomen is soft.  Musculoskeletal:     Right lower leg: No edema.     Left lower leg: No edema.  Lymphadenopathy:     Cervical: No cervical adenopathy.  Skin:    General: Skin is warm and dry.  Neurological:     General: No focal deficit present.     Mental Status: She is alert and oriented to person, place, and time.  Psychiatric:        Mood and Affect: Mood normal.        Behavior: Behavior normal.        Thought Content: Thought content normal.        Judgment: Judgment normal.      No results found for any visits on 09/26/21.  Assessment & Plan     1. Essential hypertension Pressure controlled on hydralazine and losartan. - hydrALAZINE (APRESOLINE) 25 MG tablet; Take 1 tablet (25 mg total) by mouth 2 (two) times daily.  Dispense: 180 tablet; Refill: 0 - losartan (COZAAR) 100 MG tablet; Take 1 tablet (100 mg total) by mouth daily.  Dispense: 90 tablet; Refill: 0  2. Irritable bowel syndrome, unspecified type Chronic IBS with recent flares.  Be more than happy to refer back to GI.  Will refill dicyclomine. She will let us know if she wishes to be referred - dicyclomine (BENTYL) 20 MG tablet; Take 1 tablet (20 mg total) by mouth every 6  (six) hours as needed for spasms.  Dispense: 30 tablet; Refill: 3 - promethazine (PHENERGAN) 25 MG tablet; Take 1 tablet (25 mg total) by mouth every 6 (six) hours as needed for nausea or vomiting.  Dispense: 30 tablet; Refill: 3  3. Gastro-esophageal reflux disease without esophagitis Refill pantoprazole which she needs to control his symptoms - pantoprazole (PROTONIX) 40 MG tablet; Take 1 tablet (  40 mg total) by mouth daily.  Dispense: 30 tablet; Refill: 0  4. Need for influenza vaccination  - Flu Vaccine QUAD High Dose(Fluad)  5. Frequency of urination UA is negative - POCT urinalysis dipstick  6. Anxiety Chronic issue which patient has managed well.  7. Adaptive colitis   8. HH (hiatus hernia)    No follow-ups on file.      I, Wilhemena Durie, MD, have reviewed all documentation for this visit. The documentation on 09/30/21 for the exam, diagnosis, procedures, and orders are all accurate and complete.    Richard Cranford Mon, MD  [facility] [phone] (phone) [phone] (fax)  [place]

## 2021-09-27 ENCOUNTER — Ambulatory Visit: Payer: Medicare Other

## 2021-10-01 ENCOUNTER — Other Ambulatory Visit: Payer: Self-pay | Admitting: Family Medicine

## 2021-10-01 DIAGNOSIS — K219 Gastro-esophageal reflux disease without esophagitis: Secondary | ICD-10-CM

## 2021-10-01 NOTE — Telephone Encounter (Signed)
Requested Prescriptions  Pending Prescriptions Disp Refills  . pantoprazole (PROTONIX) 40 MG tablet [Pharmacy Med Name: PANTOPRAZOLE SOD DR 40 MG TAB] 90 tablet 3    Sig: TAKE 1 TABLET BY MOUTH EVERY DAY     Gastroenterology: Proton Pump Inhibitors Passed - 10/01/2021  1:30 PM      Passed - Valid encounter within last 12 months    Recent Outpatient Visits          5 days ago Essential hypertension   [facility] Jerrol Banana., MD   6 months ago Encounter for [employer] annual wellness exam   [facility] Jerrol Banana., MD   11 months ago Essential hypertension   [facility] Jerrol Banana., MD   1 year ago Essential hypertension   [facility] Jerrol Banana., MD   1 year ago Irritable bowel syndrome, unspecified type   [facility] Jerrol Banana., MD      Future Appointments            In 5 months Jerrol Banana., MD [facility], [place]

## 2021-12-25 ENCOUNTER — Other Ambulatory Visit: Payer: Self-pay | Admitting: Family Medicine

## 2021-12-25 DIAGNOSIS — I10 Essential (primary) hypertension: Secondary | ICD-10-CM

## 2021-12-25 NOTE — Telephone Encounter (Signed)
Requested Prescriptions  Pending Prescriptions Disp Refills   hydrALAZINE (APRESOLINE) 25 MG tablet [Pharmacy Med Name: HYDRALAZINE 25 MG TABLET] 180 tablet 0    Sig: TAKE 1 TABLET BY MOUTH TWICE A DAY     Cardiovascular:  Vasodilators Failed - 12/25/2021  1:23 AM      Failed - Last BP in normal range    BP Readings from Last 1 Encounters:  09/26/21 (!) 142/54         Passed - HCT in normal range and within 360 days    Hematocrit  Date Value Ref Range Status  03/27/2021 37.8 34.0 - 46.6 % Final         Passed - HGB in normal range and within 360 days    Hemoglobin  Date Value Ref Range Status  03/27/2021 12.8 11.1 - 15.9 g/dL Final         Passed - RBC in normal range and within 360 days    RBC  Date Value Ref Range Status  03/27/2021 4.16 3.77 - 5.28 x10E6/uL Final  02/26/2018 4.71 3.80 - 5.20 MIL/uL Final         Passed - WBC in normal range and within 360 days    WBC  Date Value Ref Range Status  03/27/2021 7.1 3.4 - 10.8 x10E3/uL Final  02/26/2018 8.3 3.6 - 11.0 K/uL Final         Passed - PLT in normal range and within 360 days    Platelets  Date Value Ref Range Status  03/27/2021 243 150 - 450 x10E3/uL Final         Passed - Valid encounter within last 12 months    Recent Outpatient Visits          3 months ago Essential hypertension   [facility] Jerrol Banana., MD   9 months ago Encounter for [employer] annual wellness exam   [facility] Jerrol Banana., MD   1 year ago Essential hypertension   [facility] Jerrol Banana., MD   1 year ago Essential hypertension   [facility] Jerrol Banana[facility] MD   1 year ago Irritable bowel syndrome, unspecified type   [facility] Jerrol Banana., MD      Future Appointments            In 3 months Jerrol Banana., MD [facility], [place]

## 2022-01-02 ENCOUNTER — Telehealth: Payer: Self-pay | Admitting: Family Medicine

## 2022-01-02 NOTE — Telephone Encounter (Signed)
Copied from [place] [id number]. Topic: Medicare AWV >> Jan 02, 2022  2:51 PM Cher Nakai R wrote: Reason for CRM:  Left message for patient to call back and schedule Medicare Annual Wellness Visit (AWV) in office.   If not able to come in office, please offer to do virtually or by telephone.   Last AWV: 04/19/2020  Please schedule at anytime with [facility] Health Advisor.  If any questions, please contact me at [phone]

## 2022-01-31 DIAGNOSIS — R002 Palpitations: Secondary | ICD-10-CM | POA: Insufficient documentation

## 2022-02-10 ENCOUNTER — Other Ambulatory Visit: Payer: Self-pay | Admitting: Family Medicine

## 2022-02-10 DIAGNOSIS — I10 Essential (primary) hypertension: Secondary | ICD-10-CM

## 2022-02-13 ENCOUNTER — Other Ambulatory Visit: Payer: Self-pay | Admitting: Family Medicine

## 2022-02-13 ENCOUNTER — Telehealth: Payer: Self-pay

## 2022-02-13 DIAGNOSIS — Z1231 Encounter for screening mammogram for malignant neoplasm of breast: Secondary | ICD-10-CM

## 2022-02-13 NOTE — Telephone Encounter (Signed)
Copied from [place] [id number]. Topic: Appointment Scheduling - Scheduling Inquiry for Clinic ?>> Feb 13, 2022  2:26 PM Erick Blinks wrote: ?Reason for CRM: Pt needs an order for a mammogram, she is due this month. Wants order sent to [facility] ?Best contact: [phone] ? ?Order placed. Left detailed message advising patient to call [place] to schedule appt.  ?

## 2022-02-14 DIAGNOSIS — I480 Paroxysmal atrial fibrillation: Secondary | ICD-10-CM | POA: Insufficient documentation

## 2022-02-24 ENCOUNTER — Other Ambulatory Visit: Payer: Self-pay | Admitting: Family Medicine

## 2022-02-24 DIAGNOSIS — I10 Essential (primary) hypertension: Secondary | ICD-10-CM

## 2022-03-01 ENCOUNTER — Other Ambulatory Visit: Payer: Self-pay | Admitting: Family Medicine

## 2022-03-01 ENCOUNTER — Ambulatory Visit: Payer: Self-pay

## 2022-03-01 DIAGNOSIS — K219 Gastro-esophageal reflux disease without esophagitis: Secondary | ICD-10-CM

## 2022-03-01 NOTE — Telephone Encounter (Signed)
Pt stated she picked up medication hydralazine hydrochloride 25 MG. However, her old label was hydrALAZINE 25 MG tablet .Pt is concerned that this may be wrong medication and would like to get clarification.  ? ?Seeking clinical advice.  ? ? ? ?Chief Complaint: Pt. States her Hydralazine from [facility] is in a "different looking bottle, the pill looks the same." Unsure if it is the right medication. Instructed to call [facility] and verify. ?Symptoms: n/a ?Frequency: n/a ?Pertinent Negatives: Patient denies n/a ?Disposition: '[]'$ ED /'[]'$ Urgent Care (no appt availability in office) / '[]'$ Appointment(In office/virtual)/ '[]'$  [facility]/ '[]'$ Home Care/ '[]'$ Refused Recommended Disposition /'[]'$ [facility]/ '[]'$  Follow-up with PCP ?Additional Notes: Will call pharmacy and verify medication.  ?Answer Assessment - Initial Assessment Questions ?1. NAME of MEDICATION: "What medicine are you calling about?" ?    Hydralazine ?2. QUESTION: "What is your question?" (e.g., double dose of medicine, side effect) ?    States her bottle "looks different but the pill looks the same." ?3. PRESCRIBING HCP: "Who prescribed it?" Reason: if prescribed by specialist, call should be referred to that group. ?    Dr. Rosanna Randy ?4. SYMPTOMS: "Do you have any symptoms?" ?    N/a ?5. SEVERITY: If symptoms are present, ask "Are they mild, moderate or severe?" ?    N/a ?6. PREGNANCY:  "Is there any chance that you are pregnant?" "When was your last menstrual period?" ?    No ? ?Protocols used: Medication Question Call-A-AH ? ?

## 2022-03-26 ENCOUNTER — Ambulatory Visit
Admission: RE | Admit: 2022-03-26 | Discharge: 2022-03-26 | Disposition: A | Discharge status: Home / Self Care | Payer: Medicare Other | Source: Ambulatory Visit | Attending: Family Medicine | Admitting: Family Medicine

## 2022-03-26 DIAGNOSIS — Z1231 Encounter for screening mammogram for malignant neoplasm of breast: Secondary | ICD-10-CM | POA: Diagnosis present

## 2022-03-27 ENCOUNTER — Ambulatory Visit (INDEPENDENT_AMBULATORY_CARE_PROVIDER_SITE_OTHER): Payer: Medicare Other

## 2022-03-27 VITALS — Wt 129.0 lb

## 2022-03-27 DIAGNOSIS — Z0000 Encounter for general adult medical examination without abnormal findings: Secondary | ICD-10-CM | POA: Diagnosis not present

## 2022-03-27 NOTE — Patient Instructions (Signed)
Ms. Mcneeley , ?Thank you for taking time to come for your Medicare Wellness Visit. I appreciate your ongoing commitment to your health goals. Please review the following plan we discussed and let me know if I can assist you in the future.  ? ?Screening recommendations/referrals: ?Colonoscopy: aged out ?Mammogram: aged out ?Bone Density: 04/24/21 ?Recommended yearly ophthalmology/optometry visit for glaucoma screening and checkup ?Recommended yearly dental visit for hygiene and checkup ? ?Vaccinations: ?Influenza vaccine: 09/26/21 ?Pneumococcal vaccine: 11/16/14 ?Tdap vaccine: 04/07/19 ?Shingles vaccine: n/d   ?Covid-19:12/15/19, 01/12/20, 09/24/20 ? ?Advanced directives: no ? ?Conditions/risks identified: none ? ?Next appointment: Follow up in one year for your annual wellness visit 04/01/23 @ 10:30am by phone ? ? ?Preventive Care 63 Years and Older, Female ?Preventive care refers to lifestyle choices and visits with your health care provider that can promote health and wellness. ?What does preventive care include? ?A yearly physical exam. This is also called an annual well check. ?Dental exams once or twice a year. ?Routine eye exams. Ask your health care provider how often you should have your eyes checked. ?Personal lifestyle choices, including: ?Daily care of your teeth and gums. ?Regular physical activity. ?Eating a healthy diet. ?Avoiding tobacco and drug use. ?Limiting alcohol use. ?Practicing safe sex. ?Taking low-dose aspirin every day. ?Taking vitamin and mineral supplements as recommended by your health care provider. ?What happens during an annual well check? ?The services and screenings done by your health care provider during your annual well check will depend on your age, overall health, lifestyle risk factors, and family history of disease. ?Counseling  ?Your health care provider may ask you questions about your: ?Alcohol use. ?Tobacco use. ?Drug use. ?Emotional well-being. ?Home and relationship  well-being. ?Sexual activity. ?Eating habits. ?History of falls. ?Memory and ability to understand (cognition). ?Work and work Statistician. ?Reproductive health. ?Screening  ?You may have the following tests or measurements: ?Height, weight, and BMI. ?Blood pressure. ?Lipid and cholesterol levels. These may be checked every 5 years, or more frequently if you are over 38 years old. ?Skin check. ?Lung cancer screening. You may have this screening every year starting at age 51 if you have a 30-pack-year history of smoking and currently smoke or have quit within the past 15 years. ?Fecal occult blood test (FOBT) of the stool. You may have this test every year starting at age 19. ?Flexible sigmoidoscopy or colonoscopy. You may have a sigmoidoscopy every 5 years or a colonoscopy every 10 years starting at age 58. ?Hepatitis C blood test. ?Hepatitis B blood test. ?Sexually transmitted disease (STD) testing. ?Diabetes screening. This is done by checking your blood sugar (glucose) after you have not eaten for a while (fasting). You may have this done every 1-3 years. ?Bone density scan. This is done to screen for osteoporosis. You may have this done starting at age 49. ?Mammogram. This may be done every 1-2 years. Talk to your health care provider about how often you should have regular mammograms. ?Talk with your health care provider about your test results, treatment options, and if necessary, the need for more tests. ?Vaccines  ?Your health care provider may recommend certain vaccines, such as: ?Influenza vaccine. This is recommended every year. ?Tetanus, diphtheria, and acellular pertussis (Tdap, Td) vaccine. You may need a Td booster every 10 years. ?Zoster vaccine. You may need this after age 20. ?Pneumococcal 13-valent conjugate (PCV13) vaccine. One dose is recommended after age 54. ?Pneumococcal polysaccharide (PPSV23) vaccine. One dose is recommended after age 51. ?Talk to your health  care provider about which  screenings and vaccines you need and how often you need them. ?This information is not intended to replace advice given to you by your health care provider. Make sure you discuss any questions you have with your health care provider. ?Document Released: 12/16/2015 Document Revised: 08/08/2016 Document Reviewed: 09/20/2015 ?Elsevier Interactive Patient Education ? 2017 [place]. ? ?Fall Prevention in the Home ?Falls can cause injuries. They can happen to people of all ages. There are many things you can do to make your home safe and to help prevent falls. ?What can I do on the outside of my home? ?Regularly fix the edges of walkways and driveways and fix any cracks. ?Remove anything that might make you trip as you walk through a door, such as a raised step or threshold. ?Trim any bushes or trees on the path to your home. ?Use bright outdoor lighting. ?Clear any walking paths of anything that might make someone trip, such as rocks or tools. ?Regularly check to see if handrails are loose or broken. Make sure that both sides of any steps have handrails. ?Any raised decks and porches should have guardrails on the edges. ?Have any leaves, snow, or ice cleared regularly. ?Use sand or salt on walking paths during winter. ?Clean up any spills in your garage right away. This includes oil or grease spills. ?What can I do in the bathroom? ?Use night lights. ?Install grab bars by the toilet and in the tub and shower. Do not use towel bars as grab bars. ?Use non-skid mats or decals in the tub or shower. ?If you need to sit down in the shower, use a plastic, non-slip stool. ?Keep the floor dry. Clean up any water that spills on the floor as soon as it happens. ?Remove soap buildup in the tub or shower regularly. ?Attach bath mats securely with double-sided non-slip rug tape. ?Do not have throw rugs and other things on the floor that can make you trip. ?What can I do in the bedroom? ?Use night lights. ?Make sure that you have a  light by your bed that is easy to reach. ?Do not use any sheets or blankets that are too big for your bed. They should not hang down onto the floor. ?Have a firm chair that has side arms. You can use this for support while you get dressed. ?Do not have throw rugs and other things on the floor that can make you trip. ?What can I do in the kitchen? ?Clean up any spills right away. ?Avoid walking on wet floors. ?Keep items that you use a lot in easy-to-reach places. ?If you need to reach something above you, use a strong step stool that has a grab bar. ?Keep electrical cords out of the way. ?Do not use floor polish or wax that makes floors slippery. If you must use wax, use non-skid floor wax. ?Do not have throw rugs and other things on the floor that can make you trip. ?What can I do with my stairs? ?Do not leave any items on the stairs. ?Make sure that there are handrails on both sides of the stairs and use them. Fix handrails that are broken or loose. Make sure that handrails are as long as the stairways. ?Check any carpeting to make sure that it is firmly attached to the stairs. Fix any carpet that is loose or worn. ?Avoid having throw rugs at the top or bottom of the stairs. If you do have throw rugs, attach them to  the floor with carpet tape. ?Make sure that you have a light switch at the top of the stairs and the bottom of the stairs. If you do not have them, ask someone to add them for you. ?What else can I do to help prevent falls? ?Wear shoes that: ?Do not have high heels. ?Have rubber bottoms. ?Are comfortable and fit you well. ?Are closed at the toe. Do not wear sandals. ?If you use a stepladder: ?Make sure that it is fully opened. Do not climb a closed stepladder. ?Make sure that both sides of the stepladder are locked into place. ?Ask someone to hold it for you, if possible. ?Clearly mark and make sure that you can see: ?Any grab bars or handrails. ?First and last steps. ?Where the edge of each step  is. ?Use tools that help you move around (mobility aids) if they are needed. These include: ?Canes. ?Walkers. ?Scooters. ?Crutches. ?Turn on the lights when you go into a dark area. Replace any light bulbs as soon as they

## 2022-03-27 NOTE — Progress Notes (Signed)
Virtual Visit via Telephone Note  I connected with  Bianca Malone on 03/27/22 at 11:00 AM EDT by telephone and verified that I am speaking with the correct person using two identifiers.  Location: Patient: home Provider: [facility] Persons participating in the virtual visit: [place]   I discussed the limitations, risks, security and privacy concerns of performing an evaluation and management service by telephone and the availability of in person appointments. The patient expressed understanding and agreed to proceed.  Interactive audio and video telecommunications were attempted between this nurse and patient, however failed, due to patient having technical difficulties OR patient did not have access to video capability.  We continued and completed visit with audio only.  Some vital signs may be absent or patient reported.   Dionisio David, LPN  Subjective:   Bianca Malone is a 87 y.o. female who presents for Medicare Annual (Subsequent) preventive examination.  Review of Systems           Objective:    There were no vitals filed for this visit. There is no height or weight on file to calculate BMI.     03/14/2021   10:15 AM 04/19/2020    2:43 PM 02/26/2018    9:18 PM 07/22/2017   12:29 PM 11/29/2016   10:04 AM  Advanced Directives  Does Patient Have a Medical Advance Directive? Yes Yes Yes Yes Yes  Type of Paramedic of [place];Living will [place];Living will Living will [place];Living will Living will;Healthcare Power of Attorney  Does patient want to make changes to medical advance directive? No - Patient declined  No - Patient declined    Copy of [place] in Chart? No - copy requested No - copy requested   No - copy requested    Current Medications (verified) Outpatient Encounter Medications as of 03/27/2022  Medication Sig   metoprolol succinate (TOPROL-XL) 50 MG 24 hr  tablet Take 1 tablet by mouth daily.   aspirin 81 MG chewable tablet Chew by mouth.    CALCIUM CARBONATE-VITAMIN D PO Take 2 tablets by mouth daily. Reported on 03/08/2016   clidinium-chlordiazePOXIDE (LIBRAX) 5-2.5 MG capsule Take 1 capsule by mouth 2 (two) times daily as needed.   dicyclomine (BENTYL) 20 MG tablet Take 1 tablet (20 mg total) by mouth every 6 (six) hours as needed for spasms.   ELIQUIS 5 MG TABS tablet Take 5 mg by mouth 2 (two) times daily.   hydrALAZINE (APRESOLINE) 25 MG tablet TAKE 1 TABLET BY MOUTH TWICE A DAY   ibuprofen (ADVIL,MOTRIN) 600 MG tablet Take 1 tablet (600 mg total) by mouth every 6 (six) hours as needed.   losartan (COZAAR) 100 MG tablet TAKE 1 TABLET BY MOUTH EVERY DAY   pantoprazole (PROTONIX) 40 MG tablet TAKE 1 TABLET BY MOUTH EVERY DAY   polyethylene glycol (MIRALAX / GLYCOLAX) packet Take 17 g by mouth daily as needed.   promethazine (PHENERGAN) 25 MG tablet Take 1 tablet (25 mg total) by mouth every 6 (six) hours as needed for nausea or vomiting.   No facility-administered encounter medications on file as of 03/27/2022.    Allergies (verified) Amoxicillin, Daypro  [oxaprozin], and Sulfa antibiotics   History: Past Medical History:  Diagnosis Date   Actinic keratosis    Arthritis    Atypical mole 08/31/2008   L suprapubic   COVID-19 01/2020   GERD (gastroesophageal reflux disease)    Hypertension  Vaginal prolapse    Past Surgical History:  Procedure Laterality Date   ABDOMINAL ADHESION SURGERY     small bowel lysis of adhesions   BREAST CYST ASPIRATION Left    CARPAL TUNNEL RELEASE     CATARACT EXTRACTION W/PHACO Left 03/14/2021   Procedure: CATARACT EXTRACTION PHACO AND INTRAOCULAR LENS PLACEMENT (IOC) LEFT 9.46 01:01.2;  Surgeon: Birder Robson, MD;  Location: [facility];  Service: Ophthalmology;  Laterality: Left;   COLONOSCOPY     COLONOSCOPY WITH PROPOFOL N/A 07/22/2017   Procedure: COLONOSCOPY WITH PROPOFOL;  Surgeon:  Manya Silvas, MD;  Location: [facility] ENDOSCOPY;  Service: Endoscopy;  Laterality: N/A;   CYST EXCISION     thyroglossal duct cyst surgery   ESOPHAGOGASTRODUODENOSCOPY (EGD) WITH PROPOFOL N/A 07/22/2017   Procedure: ESOPHAGOGASTRODUODENOSCOPY (EGD) WITH PROPOFOL;  Surgeon: Manya Silvas, MD;  Location: [facility] ENDOSCOPY;  Service: Endoscopy;  Laterality: N/A;   HYSTEROSCOPY     INCISION AND DRAINAGE / EXCISION THYROGLOSSAL CYST     UPPER GASTROINTESTINAL ENDOSCOPY     Family History  Problem Relation Age of Onset   Alzheimer's disease Mother    Hypertension Mother    Kidney failure Mother    Hypertension Father    CAD Father    Heart disease Brother    Anemia Brother    Heart disease Brother    Myelodysplastic syndrome Brother    Breast cancer Paternal Aunt    Prostate cancer Paternal Uncle    Social History   Socioeconomic History   Marital status: Married    Spouse name: Not on file   Number of children: 2   Years of education: Not on file   Highest education level: Some college, no degree  Occupational History   Not on file  Tobacco Use   Smoking status: Never   Smokeless tobacco: Never  Vaping Use   Vaping Use: Never used  Substance and Sexual Activity   Alcohol use: Yes    Alcohol/week: 0.0 - 3.0 standard drinks   Drug use: No   Sexual activity: Yes    Birth control/protection: Condom  Other Topics Concern   Not on file  Social History Narrative   Not on file   Social Determinants of Health   Financial Resource Strain: Not on file  Food Insecurity: Not on file  Transportation Needs: Not on file  Physical Activity: Not on file  Stress: Not on file  Social Connections: Not on file    Tobacco Counseling Counseling given: Not Answered   Clinical Intake:  Pre-visit preparation completed: Yes  Pain : No/denies pain     Nutritional Risks: None Diabetes: No  How often do you need to have someone help you when you read instructions, pamphlets, or  other written materials from your doctor or pharmacy?: 1 - Never  Diabetic?no  Interpreter Needed?: No  Information entered by :: Kirke Shaggy, LPN   Activities of Daily Living     View : No data to display.          Patient Care Team: Jerrol Banana., MD as PCP - General (Family Medicine) Benjaman Kindler, MD as Consulting Physician (Obstetrics and Gynecology) Birder Robson, MD as Referring Physician (Ophthalmology) Beverly Gust, MD as Consulting Physician (Otolaryngology) Ralene Bathe, MD (Dermatology)  Indicate any recent Medical Services you may have received from other than [facility] providers in the past year (date may be approximate).     Assessment:   This is a routine wellness examination  for Romie Minus.  Hearing/Vision screen No results found.  Dietary issues and exercise activities discussed:     Goals Addressed   None    Depression Screen    03/27/2021    9:25 AM 07/12/2020    9:59 AM 04/19/2020    2:40 PM 10/08/2019   10:01 AM 01/15/2018    9:52 AM 01/15/2018    9:50 AM 11/29/2016   10:06 AM  PHQ 2/9 Scores  PHQ - 2 Score 0 0 0 1 0 0 0  PHQ- 9 Score 1 0  1 0      Fall Risk    03/27/2021    9:25 AM 04/19/2020    2:43 PM 10/08/2019   10:00 AM 01/15/2018    9:50 AM 11/29/2016   10:06 AM  [place] in the past year? 0 0 0 No No  Number falls in past yr: 0 0 0    Injury with Fall? 0 0 0    Risk for fall due to : No Fall Risks      Follow up Falls evaluation completed  Falls evaluation completed      [place]:  Any stairs in or around the home? Yes  If so, are there any without handrails? No  Home free of loose throw rugs in walkways, pet beds, electrical cords, etc? Yes  Adequate lighting in your home to reduce risk of falls? Yes   ASSISTIVE DEVICES UTILIZED TO PREVENT FALLS:  Life alert? No  Use of a cane, walker or w/c? No  Grab bars in the bathroom? Yes  Shower chair or bench in  shower? No  Elevated toilet seat or a handicapped toilet? Yes   Cognitive Function:       11/29/2016   10:11 AM  6CIT Screen  What Year? 0 points  What month? 0 points  What time? 0 points  Count back from 20 0 points  Months in reverse 0 points  Repeat phrase 2 points  Total Score 2 points    Immunizations Immunization History  Administered Date(s) Administered   Fluad Quad(high Dose 65+) 09/02/2019, 10/12/2020, 09/26/2021   Influenza Split 11/04/2012   Influenza, High Dose Seasonal PF 11/16/2014, 08/22/2015, 09/29/2016, 09/05/2017, 09/16/2018   Influenza,inj,Quad PF,6+ Mos 10/14/2013   Moderna Sars-Covid-2 Vaccination 12/15/2019, 01/12/2020, 09/24/2020   Pneumococcal Conjugate-13 11/16/2014   Pneumococcal Polysaccharide-23 03/13/2011   Td 03/30/2004, 04/07/2019    TDAP status: Up to date  Flu Vaccine status: Up to date  Pneumococcal vaccine status: Up to date  Covid-19 vaccine status: Completed vaccines  Qualifies for Shingles Vaccine? Yes   Zostavax completed No   Shingrix Completed?: No.    Education has been provided regarding the importance of this vaccine. Patient has been advised to call insurance company to determine out of pocket expense if they have not yet received this vaccine. Advised may also receive vaccine at local pharmacy or Health Dept. Verbalized acceptance and understanding.  Screening Tests Health Maintenance  Topic Date Due   Zoster Vaccines- Shingrix (1 of 2) Never done   COVID-19 Vaccine (4 - Booster for Moderna series) 11/19/2020   INFLUENZA VACCINE  07/03/2022   DEXA SCAN  04/24/2026   TETANUS/TDAP  04/06/2029   Pneumonia Vaccine 38+ Years old  Completed   HPV VACCINES  Aged Out    Health Maintenance  Health Maintenance Due  Topic Date Due   Zoster Vaccines- Shingrix (1 of 2) Never done  COVID-19 Vaccine (4 - Booster for Moderna series) 11/19/2020    Colorectal cancer screening: No longer required.   Mammogram status: No  longer required due to age.  Bone Density status: Completed 04/24/21. Results reflect: Bone density results: NORMAL. Repeat every 5 years.- aged out  Lung Cancer Screening: (Low Dose CT Chest recommended if Age 33-80 years, 30 pack-year currently smoking OR have quit w/in 15years.) does not qualify.   Additional Screening:  Hepatitis C Screening: does not qualify; Completed no  Vision Screening: Recommended annual ophthalmology exams for early detection of glaucoma and other disorders of the eye. Is the patient up to date with their annual eye exam?  Yes  Who is the provider or what is the name of the office in which the patient attends annual eye exams? [facility] If pt is not established with a provider, would they like to be referred to a provider to establish care? No .   Dental Screening: Recommended annual dental exams for proper oral hygiene  Community Resource Referral / Chronic Care Management: CRR required this visit?  No   CCM required this visit?  No      Plan:     I have personally reviewed and noted the following in the patient's chart:   Medical and social history Use of alcohol, tobacco or illicit drugs  Current medications and supplements including opioid prescriptions.  Functional ability and status Nutritional status Physical activity Advanced directives List of other physicians Hospitalizations, surgeries, and ER visits in previous 12 months Vitals Screenings to include cognitive, depression, and falls Referrals and appointments  In addition, I have reviewed and discussed with patient certain preventive protocols, quality metrics, and best practice recommendations. A written personalized care plan for preventive services as well as general preventive health recommendations were provided to patient.     Dionisio David, LPN   [id number]   Nurse Notes: none

## 2022-03-28 NOTE — Progress Notes (Deleted)
      Established patient visit   Patient: Bianca Malone   DOB: [birth year]   87 y.o. Female  MRN: [id number] Visit Date: 03/29/2022  Today's healthcare provider: Wilhemena Durie, MD   No chief complaint on file.  Subjective    HPI  Hypertension, follow-up  BP Readings from Last 3 Encounters:  09/26/21 (!) 142/54  03/27/21 (!) 166/59  03/14/21 130/72   Wt Readings from Last 3 Encounters:  03/27/22 129 lb (58.5 kg)  09/26/21 129 lb (58.5 kg)  03/27/21 130 lb 12.8 oz (59.3 kg)     She was last seen for hypertension 6 months ago.  BP at that visit was as above. Management since that visit includes non3.  Outside blood pressures are {***enter patient reported home BP readings, or 'not being checked':1}. Symptoms: {Yes/No:20286} chest pain {Yes/No:20286} chest pressure  {Yes/No:20286} palpitations {Yes/No:20286} syncope  {Yes/No:20286} dyspnea {Yes/No:20286} orthopnea  {Yes/No:20286} paroxysmal nocturnal dyspnea {Yes/No:20286} lower extremity edema   Pertinent labs Lab Results  Component Value Date   CHOL 178 03/27/2021   HDL 99 03/27/2021   LDLCALC 66 03/27/2021   TRIG 66 03/27/2021   CHOLHDL 1.8 03/27/2021   Lab Results  Component Value Date   NA 136 03/27/2021   K 4.5 03/27/2021   CREATININE 0.83 03/27/2021   EGFR 69 03/27/2021   GLUCOSE 88 03/27/2021   TSH 2.100 03/27/2021     The ASCVD Risk score (Arnett DK, et al., 2019) failed to calculate for the following reasons:   The 2019 ASCVD risk score is only valid for ages 66 to 54  ---------------------------------------------------------------------------------------------------   Medications: Outpatient Medications Prior to Visit  Medication Sig   aspirin 81 MG chewable tablet Chew by mouth.  (Patient not taking: Reported on 03/27/2022)   CALCIUM CARBONATE-VITAMIN D PO Take 2 tablets by mouth daily. Reported on 03/08/2016   clidinium-chlordiazePOXIDE (LIBRAX) 5-2.5 MG capsule Take 1 capsule by mouth 2 (two)  times daily as needed.   dicyclomine (BENTYL) 20 MG tablet Take 1 tablet (20 mg total) by mouth every 6 (six) hours as needed for spasms.   ELIQUIS 5 MG TABS tablet Take 5 mg by mouth 2 (two) times daily.   hydrALAZINE (APRESOLINE) 25 MG tablet TAKE 1 TABLET BY MOUTH TWICE A DAY   ibuprofen (ADVIL,MOTRIN) 600 MG tablet Take 1 tablet (600 mg total) by mouth every 6 (six) hours as needed.   losartan (COZAAR) 100 MG tablet TAKE 1 TABLET BY MOUTH EVERY DAY   metoprolol succinate (TOPROL-XL) 50 MG 24 hr tablet Take 1 tablet by mouth daily.   pantoprazole (PROTONIX) 40 MG tablet TAKE 1 TABLET BY MOUTH EVERY DAY   polyethylene glycol (MIRALAX / GLYCOLAX) packet Take 17 g by mouth daily as needed.   promethazine (PHENERGAN) 25 MG tablet Take 1 tablet (25 mg total) by mouth every 6 (six) hours as needed for nausea or vomiting.   No facility-administered medications prior to visit.    Review of Systems  {Labs  Heme  Chem  Endocrine  Serology  Results Review (optional):23779}   Objective    There were no vitals taken for this visit. {Show previous vital signs (optional):23777}  Physical Exam  ***  No results found for any visits on 03/29/22.  Assessment & Plan     ***  No follow-ups on file.      {provider attestation***:1}   Wilhemena Durie, MD  [facility] [phone] (phone) [phone] (fax)  [place]

## 2022-03-29 ENCOUNTER — Ambulatory Visit: Payer: Medicare Other | Admitting: Family Medicine

## 2022-04-03 ENCOUNTER — Ambulatory Visit (INDEPENDENT_AMBULATORY_CARE_PROVIDER_SITE_OTHER): Payer: Medicare Other | Admitting: Family Medicine

## 2022-04-03 VITALS — BP 154/50 | HR 56 | Temp 97.3°F | Resp 100 | Wt 132.0 lb

## 2022-04-03 DIAGNOSIS — K449 Diaphragmatic hernia without obstruction or gangrene: Secondary | ICD-10-CM

## 2022-04-03 DIAGNOSIS — I10 Essential (primary) hypertension: Secondary | ICD-10-CM | POA: Diagnosis not present

## 2022-04-03 DIAGNOSIS — I480 Paroxysmal atrial fibrillation: Secondary | ICD-10-CM | POA: Diagnosis not present

## 2022-04-03 DIAGNOSIS — F419 Anxiety disorder, unspecified: Secondary | ICD-10-CM

## 2022-04-03 DIAGNOSIS — J301 Allergic rhinitis due to pollen: Secondary | ICD-10-CM

## 2022-04-03 DIAGNOSIS — R002 Palpitations: Secondary | ICD-10-CM

## 2022-04-03 NOTE — Progress Notes (Signed)
? ?I,Elena D DeSanto,acting as a scribe for Wilhemena Durie, MD.,have documented all relevant documentation on the behalf of Wilhemena Durie, MD,as directed by  Wilhemena Durie, MD while in the presence of Wilhemena Durie, MD. ?  ? ? ?Established patient visit ? ? ?Patient: Bianca Malone   DOB: 09-03-1935   87 y.o. Female  MRN: [id number] ?Visit Date: 04/03/2022 ? ?Today's healthcare provider: Wilhemena Durie, MD  ? ?No chief complaint on file. ? ?Subjective  ?  ?HPI  ?Patient is adjusting to being a widow but otherwise is doing well.  She has not really gotten plugged back into her church since [place].  She does not have family close by. ?She does complain of decreased hearing in both ears. ?She says home blood pressures run 102-130/40-50. ?Hypertension, follow-up ? ?BP Readings from Last 3 Encounters:  ?04/03/22 (!) 154/50  ?09/26/21 (!) 142/54  ?03/27/21 (!) 166/59  ? Wt Readings from Last 3 Encounters:  ?04/03/22 132 lb (59.9 kg)  ?03/27/22 129 lb (58.5 kg)  ?09/26/21 129 lb (58.5 kg)  ?  ? ?She was last seen for hypertension 6 months ago.  ?BP at that visit was as above. Management since that visit includes none. ?Patient has been put on Eliquis and Metoprolol since being diagnosed with A Fib, per the patient. ?Patient states that since starting these medicines she feels she is more off balance. ? ?Outside blood pressures are running normal to low 100-110 over 30's-60's  ?Symptoms: ?No chest pain No chest pressure  ?Yes palpitations No syncope  ?No dyspnea No orthopnea  ?No paroxysmal nocturnal dyspnea No lower extremity edema  ? ?Pertinent labs ?Lab Results  ?Component Value Date  ? CHOL 178 03/27/2021  ? HDL 99 03/27/2021  ? [place] 66 03/27/2021  ? TRIG 66 03/27/2021  ? CHOLHDL 1.8 03/27/2021  ? Lab Results  ?Component Value Date  ? NA 136 03/27/2021  ? K 4.5 03/27/2021  ? CREATININE 0.83 03/27/2021  ? EGFR 69 03/27/2021  ? GLUCOSE 88 03/27/2021  ? TSH 2.100 03/27/2021  ?  ? ?The ASCVD Risk score  (Arnett DK, et al., 2019) failed to calculate for the following reasons: ?  The 2019 ASCVD risk score is only valid for ages 43 to 51 ? ?--------------------------------------------------------------------------------------------------- ? ? ?Medications: ?Outpatient Medications Prior to Visit  ?Medication Sig  ? CALCIUM CARBONATE-VITAMIN D PO Take 2 tablets by mouth daily. Reported on 03/08/2016  ? dicyclomine (BENTYL) 20 MG tablet Take 1 tablet (20 mg total) by mouth every 6 (six) hours as needed for spasms.  ? ELIQUIS 5 MG TABS tablet Take 5 mg by mouth 2 (two) times daily.  ? hydrALAZINE (APRESOLINE) 25 MG tablet TAKE 1 TABLET BY MOUTH TWICE A DAY  ? ibuprofen (ADVIL,MOTRIN) 600 MG tablet Take 1 tablet (600 mg total) by mouth every 6 (six) hours as needed.  ? losartan (COZAAR) 100 MG tablet TAKE 1 TABLET BY MOUTH EVERY DAY  ? metoprolol succinate (TOPROL-XL) 50 MG 24 hr tablet Take 1 tablet by mouth daily.  ? pantoprazole (PROTONIX) 40 MG tablet TAKE 1 TABLET BY MOUTH EVERY DAY  ? polyethylene glycol (MIRALAX / GLYCOLAX) packet Take 17 g by mouth daily as needed.  ? promethazine (PHENERGAN) 25 MG tablet Take 1 tablet (25 mg total) by mouth every 6 (six) hours as needed for nausea or vomiting.  ? clidinium-chlordiazePOXIDE (LIBRAX) 5-2.5 MG capsule Take 1 capsule by mouth 2 (two) times daily as needed. (Patient not  taking: Reported on 04/03/2022)  ? [DISCONTINUED] aspirin 81 MG chewable tablet Chew by mouth.  (Patient not taking: Reported on 03/27/2022)  ? ?No facility-administered medications prior to visit.  ? ? ?Review of Systems ? ?  ?  Objective  ?  ?BP (!) 154/50 (BP Location: Left Arm, Patient Position: Sitting, Cuff Size: Normal)   Pulse (!) 56   Temp (!) 97.3 ?F (36.3 ?C) (Oral)   Resp (!) 100   Wt 132 lb (59.9 kg)   BMI 21.97 kg/m?  ?  ? ?Physical Exam ?Vitals reviewed.  ?Constitutional:   ?   Appearance: Normal appearance. She is well-developed.  ?   Comments: Patient appears younger than her age of 71.   ?HENT:  ?   Head: Normocephalic and atraumatic.  ?   Right Ear: Tympanic membrane and external ear normal.  ?   Left Ear: Tympanic membrane and external ear normal.  ?Eyes:  ?   General: No scleral icterus. ?   Conjunctiva/sclera: Conjunctivae normal.  ?Neck:  ?   Thyroid: No thyromegaly.  ?   Vascular: No carotid bruit.  ?Cardiovascular:  ?   Rate and Rhythm: Normal rate and regular rhythm.  ?   Heart sounds: Normal heart sounds.  ?Pulmonary:  ?   Effort: Pulmonary effort is normal.  ?   Breath sounds: Normal breath sounds.  ?Abdominal:  ?   Palpations: Abdomen is soft.  ?Musculoskeletal:  ?   Right lower leg: No edema.  ?   Left lower leg: No edema.  ?Lymphadenopathy:  ?   Cervical: No cervical adenopathy.  ?Skin: ?   General: Skin is warm and dry.  ?Neurological:  ?   General: No focal deficit present.  ?   Mental Status: She is alert and oriented to person, place, and time.  ?Psychiatric:     ?   Mood and Affect: Mood normal.     ?   Behavior: Behavior normal.     ?   Thought Content: Thought content normal.     ?   Judgment: Judgment normal.  ?  ? ? ?No results found for any visits on 04/03/22. ? Assessment & Plan  ?  ? ?1. Paroxysmal atrial fibrillation (HCC) ?On full dose Eliquis.  Consider adjusting dosing ? ?2. Essential hypertension ?Patient running hypotensive recently.  At this time stop her hydralazine. ?More than 50% 30 minute visit spent in counseling or coordination of care ? ?3. HH (hiatus hernia) ?Clinically stable and followed chronically by GI ?She has symptomatic functional bowel disease ?4. Seasonal allergic rhinitis due to pollen ? ? ?5. Heart palpitations ?Clinically stable recently ? ?6. Anxiety ?Chronic problem but doing well with some slight depressive symptoms recently.  I think it would be very helpful for her to get plugged back into her church. ? ? ?No follow-ups on file.  ?   ? ?I, Wilhemena Durie, MD, have reviewed all documentation for this visit. The documentation on 04/09/22  for the exam, diagnosis, procedures, and orders are all accurate and complete. ? ? ? ?Ayra Hodgdon Cranford Mon, MD  ?[facility] ?[phone] (phone) ?[phone] (fax) ? ?[facility] ?

## 2022-05-02 ENCOUNTER — Ambulatory Visit (INDEPENDENT_AMBULATORY_CARE_PROVIDER_SITE_OTHER): Payer: Medicare Other | Admitting: Dermatology

## 2022-05-02 DIAGNOSIS — L814 Other melanin hyperpigmentation: Secondary | ICD-10-CM | POA: Diagnosis not present

## 2022-05-02 DIAGNOSIS — Z1283 Encounter for screening for malignant neoplasm of skin: Secondary | ICD-10-CM

## 2022-05-02 DIAGNOSIS — L719 Rosacea, unspecified: Secondary | ICD-10-CM

## 2022-05-02 DIAGNOSIS — L821 Other seborrheic keratosis: Secondary | ICD-10-CM | POA: Diagnosis not present

## 2022-05-02 DIAGNOSIS — L820 Inflamed seborrheic keratosis: Secondary | ICD-10-CM | POA: Diagnosis not present

## 2022-05-02 DIAGNOSIS — Z86018 Personal history of other benign neoplasm: Secondary | ICD-10-CM

## 2022-05-02 DIAGNOSIS — D229 Melanocytic nevi, unspecified: Secondary | ICD-10-CM

## 2022-05-02 DIAGNOSIS — L578 Other skin changes due to chronic exposure to nonionizing radiation: Secondary | ICD-10-CM

## 2022-05-02 DIAGNOSIS — D1800 Hemangioma unspecified site: Secondary | ICD-10-CM

## 2022-05-02 NOTE — Progress Notes (Signed)
Follow-Up Visit   Subjective  Bianca Malone is a 87 y.o. female who presents for the following: Annual Exam (1 yr tbse. Hx of rosacea, epidermal cyst , hx of dysplastic nevi at left supra pubic. Spot at right shoulder to check. ). The patient presents for Total-Body Skin Exam (TBSE) for skin cancer screening and mole check.  The patient has spots, moles and lesions to be evaluated, some may be new or changing and the patient has concerns that these could be cancer.  The following portions of the chart were reviewed this encounter and updated as appropriate:  Tobacco  Allergies  Meds  Problems  Med Hx  Surg Hx  Fam Hx     Review of Systems: No other skin or systemic complaints except as noted in HPI or Assessment and Plan.  Objective  Well appearing patient in no apparent distress; mood and affect are within normal limits.  A full examination was performed including scalp, head, eyes, ears, nose, lips, neck, chest, axillae, abdomen, back, buttocks, bilateral upper extremities, bilateral lower extremities, hands, feet, fingers, toes, fingernails, and toenails. All findings within normal limits unless otherwise noted below.  right posterior shoulder x 2, left shoulder x 1, (3) Erythematous stuck-on, waxy papule or plaque   Assessment & Plan  Rosacea Head - Anterior (Face) Rosacea is a chronic progressive skin condition usually affecting the face of adults, causing redness and/or acne bumps. It is treatable but not curable. It sometimes affects the eyes (ocular rosacea) as well. It may respond to topical and/or systemic medication and can flare with stress, sun exposure, alcohol, exercise and some foods.  Daily application of broad spectrum spf 30+ sunscreen to face is recommended to reduce flares. Mild-patient declines treatment today.  Inflamed seborrheic keratosis (3) right posterior shoulder x 2, left shoulder x 1, Symptomatic, irritating, patient would like treated. Destruction  of lesion - right posterior shoulder x 2, left shoulder x 1, Complexity: simple   Destruction method: cryotherapy   Informed consent: discussed and consent obtained   Timeout:  patient name, date of birth, surgical site, and procedure verified Lesion destroyed using liquid nitrogen: Yes   Region frozen until ice ball extended beyond lesion: Yes   Outcome: patient tolerated procedure well with no complications   Post-procedure details: wound care instructions given   Additional details:  Prior to procedure, discussed risks of blister formation, small wound, skin dyspigmentation, or rare scar following cryotherapy. Recommend Vaseline ointment to treated areas while healing.  Lentigines - Scattered tan macules - Due to sun exposure - Benign-appearing, observe - Recommend daily broad spectrum sunscreen SPF 30+ to sun-exposed areas, reapply every 2 hours as needed. - Call for any changes  Seborrheic Keratoses - Stuck-on, waxy, tan-brown papules and/or plaques  - Benign-appearing - Discussed benign etiology and prognosis. - Observe - Call for any changes  Melanocytic Nevi - Tan-brown and/or pink-flesh-colored symmetric macules and papules - Benign appearing on exam today - Observation - Call clinic for new or changing moles - Recommend daily use of broad spectrum spf 30+ sunscreen to sun-exposed areas.   Hemangiomas - Red papules - Discussed benign nature - Observe - Call for any changes  Actinic Damage - Chronic condition, secondary to cumulative UV/sun exposure - diffuse scaly erythematous macules with underlying dyspigmentation - Recommend daily broad spectrum sunscreen SPF 30+ to sun-exposed areas, reapply every 2 hours as needed.  - Staying in the shade or wearing long sleeves, sun glasses (UVA+UVB protection) and wide  brim hats (4-inch brim around the entire circumference of the hat) are also recommended for sun protection.  - Call for new or changing lesions.  History of  Dysplastic Nevi - No evidence of recurrence today at lt supra pubic  - Recommend regular full body skin exams - Recommend daily broad spectrum sunscreen SPF 30+ to sun-exposed areas, reapply every 2 hours as needed.  - Call if any new or changing lesions are noted between office visits  Skin cancer screening performed today. Return in about 1 year (around 05/03/2023) for TBSE. IRuthell Rummage, CMA, am acting as scribe for Sarina Ser, MD. Documentation: I have reviewed the above documentation for accuracy and completeness, and I agree with the above.  Sarina Ser, MD

## 2022-05-02 NOTE — Patient Instructions (Addendum)
Seborrheic Keratosis  What causes seborrheic keratoses? Seborrheic keratoses are harmless, common skin growths that first appear during adult life.  As time goes by, more growths appear.  Some people may develop a large number of them.  Seborrheic keratoses appear on both covered and uncovered body parts.  They are not caused by sunlight.  The tendency to develop seborrheic keratoses can be inherited.  They vary in color from skin-colored to gray, brown, or even black.  They can be either smooth or have a rough, warty surface.   Seborrheic keratoses are superficial and look as if they were stuck on the skin.  Under the microscope this type of keratosis looks like layers upon layers of skin.  That is why at times the top layer may seem to fall off, but the rest of the growth remains and re-grows.    Treatment Seborrheic keratoses do not need to be treated, but can easily be removed in the office.  Seborrheic keratoses often cause symptoms when they rub on clothing or jewelry.  Lesions can be in the way of shaving.  If they become inflamed, they can cause itching, soreness, or burning.  Removal of a seborrheic keratosis can be accomplished by freezing, burning, or surgery. If any spot bleeds, scabs, or grows rapidly, please return to have it checked, as these can be an indication of a skin cancer.  Cryotherapy Aftercare  Wash gently with soap and water everyday.   Apply Vaseline and Band-Aid daily until healed.    Melanoma ABCDEs  Melanoma is the most dangerous type of skin cancer, and is the leading cause of death from skin disease.  You are more likely to develop melanoma if you: Have light-colored skin, light-colored eyes, or red or blond hair Spend a lot of time in the sun Tan regularly, either outdoors or in a tanning bed Have had blistering sunburns, especially during childhood Have a close family member who has had a melanoma Have atypical moles or large birthmarks  Early detection of  melanoma is key since treatment is typically straightforward and cure rates are extremely high if we catch it early.   The first sign of melanoma is often a change in a mole or a new dark spot.  The ABCDE system is a way of remembering the signs of melanoma.  A for asymmetry:  The two halves do not match. B for border:  The edges of the growth are irregular. C for color:  A mixture of colors are present instead of an even brown color. D for diameter:  Melanomas are usually (but not always) greater than 4m - the size of a pencil eraser. E for evolution:  The spot keeps changing in size, shape, and color.  Please check your skin once per month between visits. You can use a small mirror in front and a large mirror behind you to keep an eye on the back side or your body.   If you see any new or changing lesions before your next follow-up, please call to schedule a visit.  Please continue daily skin protection including broad spectrum sunscreen SPF 30+ to sun-exposed areas, reapplying every 2 hours as needed when you're outdoors.   Staying in the shade or wearing long sleeves, sun glasses (UVA+UVB protection) and wide brim hats (4-inch brim around the entire circumference of the hat) are also recommended for sun protection.    If You Need Anything After Your Visit  If you have any questions or concerns for  your doctor, please call our main line at [phone] and press option 4 to reach your doctor's medical assistant. If no one answers, please leave a voicemail as directed and we will return your call as soon as possible. Messages left after 4 pm will be answered the following business day.   You may also send [place] a message via [place]. We typically respond to MyChart messages within 1-2 business days.  For prescription refills, please ask your pharmacy to contact our office. Our fax number is [phone].  If you have an urgent issue when the clinic is closed that cannot wait until the next  business day, you can page your doctor at the number below.    Please note that while we do our best to be available for urgent issues outside of office hours, we are not available 24/7.   If you have an urgent issue and are unable to reach [place], you may choose to seek medical care at your doctor's office, retail clinic, urgent care center, or emergency room.  If you have a medical emergency, please immediately call 911 or go to the emergency department.  Pager Numbers  - Dr. Nehemiah Massed: [phone]  - Dr. Laurence Ferrari: [phone]  - Dr. Nicole Kindred: [phone]  In the event of inclement weather, please call our main line at [phone] for an update on the status of any delays or closures.  Dermatology Medication Tips: Please keep the boxes that topical medications come in in order to help keep track of the instructions about where and how to use these. Pharmacies typically print the medication instructions only on the boxes and not directly on the medication tubes.   If your medication is too expensive, please contact our office at [phone] option 4 or send [place] a message through [place].   We are unable to tell what your co-pay for medications will be in advance as this is different depending on your insurance coverage. However, we may be able to find a substitute medication at lower cost or fill out paperwork to get insurance to cover a needed medication.   If a prior authorization is required to get your medication covered by your insurance company, please allow [place] 1-2 business days to complete this process.  Drug prices often vary depending on where the prescription is filled and some pharmacies may offer cheaper prices.  The website www.goodrx.com contains coupons for medications through different pharmacies. The prices here do not account for what the cost may be with help from insurance (it may be cheaper with your insurance), but the website can give you the price if you did not use  any insurance.  - You can print the associated coupon and take it with your prescription to the pharmacy.  - You may also stop by our office during regular business hours and pick up a GoodRx coupon card.  - If you need your prescription sent electronically to a different pharmacy, notify our office through [facility] or by phone at [phone] option 4.     Si Usted Necesita Algo Despus de Su Visita  Tambin puede enviarnos un mensaje a travs de Pharmacist, community. Por lo general respondemos a los mensajes de MyChart en el transcurso de 1 a 2 das hbiles.  Para renovar recetas, por favor pida a su farmacia que se ponga en contacto con nuestra oficina. Harland Dingwall de fax es [place] [phone].  Si tiene un asunto urgente cuando la clnica est cerrada y que no puede esperar Museum/gallery curator  siguiente da hbil, puede llamar/localizar a su doctor(a) al nmero que aparece a continuacin.   Por favor, tenga en cuenta que aunque hacemos todo lo posible para estar disponibles para asuntos urgentes fuera del horario de [place], no estamos disponibles las 24 horas del da, los 7 das de la [place].   Si tiene un problema urgente y no puede comunicarse con nosotros, puede optar por buscar atencin mdica  en el consultorio de su doctor(a), en una clnica privada, en un centro de atencin urgente o en una sala de emergencias.  Si tiene Engineering geologist, por favor llame inmediatamente al 911 o vaya a la sala de emergencias.  Nmeros de bper  - Dr. Nehemiah Massed: [phone]  - Dra. Moye: [phone]  - Dra. Nicole Kindred: [phone]  En caso de inclemencias del [place], por favor llame a Johnsie Kindred principal al [phone] para una actualizacin sobre el [place] de cualquier retraso o cierre.  Consejos para la medicacin en dermatologa: Por favor, guarde las cajas en las que vienen los medicamentos de uso tpico para ayudarle a seguir las instrucciones sobre dnde y cmo usarlos. Las farmacias  generalmente imprimen las instrucciones del medicamento slo en las cajas y no directamente en los tubos del [place].   Si su medicamento es muy caro, por favor, pngase en contacto con Zigmund Daniel llamando al [phone] y presione la opcin 4 o envenos un mensaje a travs de Pharmacist, community.   No podemos decirle cul ser su copago por los medicamentos por adelantado ya que esto es diferente dependiendo de la cobertura de su seguro. Sin embargo, es posible que podamos encontrar un medicamento sustituto a Electrical engineer un formulario para que el seguro cubra el medicamento que se considera necesario.   Si se requiere una autorizacin previa para que su compaa de seguros [place] su medicamento, por favor permtanos de 1 a 2 das hbiles para completar este proceso.  Los precios de los medicamentos varan con frecuencia dependiendo del Environmental consultant de dnde se surte la receta y alguna farmacias pueden ofrecer precios ms baratos.  El sitio web www.goodrx.com tiene cupones para medicamentos de Airline pilot. Los precios aqu no tienen en cuenta lo que podra costar con la ayuda del seguro (puede ser ms barato con su seguro), pero el sitio web puede darle el precio si no utiliz Research scientist (physical sciences).  - Puede imprimir el cupn correspondiente y llevarlo con su receta a la farmacia.  - Tambin puede pasar por nuestra oficina durante el horario de atencin regular y Charity fundraiser una tarjeta de cupones de GoodRx.  - Si necesita que su receta se enve electrnicamente a una farmacia diferente, informe a nuestra oficina a travs de MyChart de [place] o por telfono llamando al [phone] y presione la opcin 4.

## 2022-05-06 ENCOUNTER — Encounter: Payer: Self-pay | Admitting: Dermatology

## 2022-05-31 ENCOUNTER — Other Ambulatory Visit: Payer: Self-pay | Admitting: Family Medicine

## 2022-05-31 DIAGNOSIS — I10 Essential (primary) hypertension: Secondary | ICD-10-CM

## 2022-06-15 NOTE — Progress Notes (Unsigned)
Established patient visit  I,April Miller,acting as a scribe for Wilhemena Durie, MD.,have documented all relevant documentation on the behalf of Wilhemena Durie, MD,as directed by  Wilhemena Durie, MD while in the presence of Wilhemena Durie, MD.   Patient: Bianca Malone   DOB: 18-Jun-1935   87 y.o. Female  MRN: [id number] Visit Date: 06/18/2022  Today's healthcare provider: Wilhemena Durie, MD   Chief Complaint  Patient presents with   Follow-up   Hypotension   Subjective    HPI  Patient comes in today for follow-up.  She is feeling fairly well.  She has no complaints of syncope or presyncope and is tolerating her medications well. Home blood pressures are all 120s over 70 with heart rate in the upper 40s.  Hypertension, follow-up  BP Readings from Last 3 Encounters:  06/18/22 (!) 154/55  04/03/22 (!) 154/50  09/26/21 (!) 142/54   Wt Readings from Last 3 Encounters:  06/18/22 132 lb (59.9 kg)  04/03/22 132 lb (59.9 kg)  03/27/22 129 lb (58.5 kg)     She was last seen for hypertension 2 months ago.  Management since that visit includes; Patient running hypotensive recently.  At this time stop her hydralazine.  Outside blood pressures are 105/39--154/63.  Pertinent labs Lab Results  Component Value Date   CHOL 178 03/27/2021   HDL 99 03/27/2021   LDLCALC 66 03/27/2021   TRIG 66 03/27/2021   CHOLHDL 1.8 03/27/2021   Lab Results  Component Value Date   NA 136 03/27/2021   K 4.5 03/27/2021   CREATININE 0.83 03/27/2021   EGFR 69 03/27/2021   GLUCOSE 88 03/27/2021   TSH 2.100 03/27/2021     The ASCVD Risk score (Arnett DK, et al., 2019) failed to calculate for the following reasons:   The 2019 ASCVD risk score is only valid for ages 51 to 77  ---------------------------------------------------------------------------------------------------   Medications: Outpatient Medications Prior to Visit  Medication Sig   Acetaminophen (TYLENOL 8 HOUR  PO) Take by mouth.   CALCIUM CARBONATE-VITAMIN D PO Take 2 tablets by mouth daily. Reported on 03/08/2016   clidinium-chlordiazePOXIDE (LIBRAX) 5-2.5 MG capsule Take 1 capsule by mouth 2 (two) times daily as needed.   dicyclomine (BENTYL) 20 MG tablet Take 1 tablet (20 mg total) by mouth every 6 (six) hours as needed for spasms.   ELIQUIS 5 MG TABS tablet Take 5 mg by mouth 2 (two) times daily.   losartan (COZAAR) 100 MG tablet TAKE 1 TABLET BY MOUTH EVERY DAY   metoprolol succinate (TOPROL-XL) 50 MG 24 hr tablet Take 1 tablet by mouth daily.   pantoprazole (PROTONIX) 40 MG tablet TAKE 1 TABLET BY MOUTH EVERY DAY   polyethylene glycol (MIRALAX / GLYCOLAX) packet Take 17 g by mouth daily as needed.   promethazine (PHENERGAN) 25 MG tablet Take 1 tablet (25 mg total) by mouth every 6 (six) hours as needed for nausea or vomiting.   [DISCONTINUED] hydrALAZINE (APRESOLINE) 25 MG tablet TAKE 1 TABLET BY MOUTH TWICE A DAY (Patient not taking: Reported on 06/18/2022)   [DISCONTINUED] ibuprofen (ADVIL,MOTRIN) 600 MG tablet Take 1 tablet (600 mg total) by mouth every 6 (six) hours as needed. (Patient not taking: Reported on 06/18/2022)   No facility-administered medications prior to visit.    Review of Systems  Constitutional:  Negative for appetite change, chills, fatigue and fever.  Respiratory:  Negative for chest tightness and shortness of breath.   Cardiovascular:  Negative for chest pain and palpitations.  Gastrointestinal:  Negative for abdominal pain, nausea and vomiting.  Neurological:  Negative for dizziness and weakness.    Last lipids Lab Results  Component Value Date   CHOL 178 03/27/2021   HDL 99 03/27/2021   LDLCALC 66 03/27/2021   TRIG 66 03/27/2021   CHOLHDL 1.8 03/27/2021       Objective    BP (!) 154/55 (BP Location: Left Arm, Cuff Size: Small)   Pulse (!) 48   Resp 16   Wt 132 lb (59.9 kg)   SpO2 100%   BMI 21.97 kg/m  BP Readings from Last 3 Encounters:  06/18/22 (!)  154/55  04/03/22 (!) 154/50  09/26/21 (!) 142/54   Wt Readings from Last 3 Encounters:  06/18/22 132 lb (59.9 kg)  04/03/22 132 lb (59.9 kg)  03/27/22 129 lb (58.5 kg)      Physical Exam Vitals reviewed.  Constitutional:      General: She is not in acute distress.    Appearance: She is well-developed.  HENT:     Head: Normocephalic and atraumatic.     Right Ear: Hearing normal.     Left Ear: Hearing normal.     Nose: Nose normal.  Eyes:     General: Lids are normal. No scleral icterus.       Right eye: No discharge.        Left eye: No discharge.     Conjunctiva/sclera: Conjunctivae normal.  Cardiovascular:     Rate and Rhythm: Normal rate and regular rhythm.     Heart sounds: Normal heart sounds.  Pulmonary:     Effort: Pulmonary effort is normal. No respiratory distress.  Skin:    General: Skin is warm and dry.     Findings: No lesion or rash.  Neurological:     General: No focal deficit present.     Mental Status: She is alert and oriented to person, place, and time.  Psychiatric:        Mood and Affect: Mood normal.        Speech: Speech normal.        Behavior: Behavior normal.        Thought Content: Thought content normal.        Judgment: Judgment normal.       No results found for any visits on 06/18/22.  Assessment & Plan     1. Essential hypertension [place] hypertension.  Home blood pressure readings are much better. - CBC w/Diff/Platelet - Comprehensive Metabolic Panel (CMET) - TSH  2. Irritable bowel syndrome, unspecified type Well-controlled.  Followed by GI at [facility] clinic - CBC w/Diff/Platelet - Comprehensive Metabolic Panel (CMET) - TSH  3. Paroxysmal atrial fibrillation (HCC) On Eliquis and metoprolol per cardiology - CBC w/Diff/Platelet - Comprehensive Metabolic Panel (CMET) - TSH  4. Heart palpitations Metoprolol has controlled her palpitations.  She has appointment with cardiology in a few weeks and asked her to ask him  about her heart rate remaining in the upper 40s.  She is completely asymptomatic at this time. - CBC w/Diff/Platelet - Comprehensive Metabolic Panel (CMET) - TSH   Return in about 6 months (around 12/19/2022).      I, Wilhemena Durie, MD, have reviewed all documentation for this visit. The documentation on 06/19/22 for the exam, diagnosis, procedures, and orders are all accurate and complete.    Jerauld Bostwick Cranford Mon, MD  [facility] [phone] (phone) [phone] (fax)  [facility]

## 2022-06-18 ENCOUNTER — Encounter: Payer: Self-pay | Admitting: Family Medicine

## 2022-06-18 ENCOUNTER — Ambulatory Visit (INDEPENDENT_AMBULATORY_CARE_PROVIDER_SITE_OTHER): Payer: Medicare Other | Admitting: Family Medicine

## 2022-06-18 ENCOUNTER — Ambulatory Visit: Payer: Medicare Other | Admitting: Family Medicine

## 2022-06-18 VITALS — BP 122/70 | HR 48 | Resp 16 | Wt 132.0 lb

## 2022-06-18 DIAGNOSIS — I480 Paroxysmal atrial fibrillation: Secondary | ICD-10-CM | POA: Diagnosis not present

## 2022-06-18 DIAGNOSIS — K589 Irritable bowel syndrome without diarrhea: Secondary | ICD-10-CM

## 2022-06-18 DIAGNOSIS — I10 Essential (primary) hypertension: Secondary | ICD-10-CM | POA: Diagnosis not present

## 2022-06-18 DIAGNOSIS — F419 Anxiety disorder, unspecified: Secondary | ICD-10-CM

## 2022-06-18 DIAGNOSIS — R002 Palpitations: Secondary | ICD-10-CM | POA: Diagnosis not present

## 2022-06-19 LAB — TSH: TSH: 2.62 u[IU]/mL (ref 0.450–4.500)

## 2022-06-19 LAB — CBC WITH DIFFERENTIAL/PLATELET
Basophils Absolute: 0.1 10*3/uL (ref 0.0–0.2)
Basos: 1 %
EOS (ABSOLUTE): 0.2 10*3/uL (ref 0.0–0.4)
Eos: 3 %
Hematocrit: 39.2 % (ref 34.0–46.6)
Hemoglobin: 13.2 g/dL (ref 11.1–15.9)
Immature Grans (Abs): 0 10*3/uL (ref 0.0–0.1)
Immature Granulocytes: 0 %
Lymphocytes Absolute: 1.4 10*3/uL (ref 0.7–3.1)
Lymphs: 20 %
MCH: 30.3 pg (ref 26.6–33.0)
MCHC: 33.7 g/dL (ref 31.5–35.7)
MCV: 90 fL (ref 79–97)
Monocytes Absolute: 0.6 10*3/uL (ref 0.1–0.9)
Monocytes: 9 %
Neutrophils Absolute: 4.8 10*3/uL (ref 1.4–7.0)
Neutrophils: 67 %
Platelets: 183 10*3/uL (ref 150–450)
RBC: 4.36 x10E6/uL (ref 3.77–5.28)
RDW: 12.5 % (ref 11.7–15.4)
WBC: 7.1 10*3/uL (ref 3.4–10.8)

## 2022-06-19 LAB — COMPREHENSIVE METABOLIC PANEL
ALT: 15 IU/L (ref 0–32)
AST: 22 IU/L (ref 0–40)
Albumin/Globulin Ratio: 2 (ref 1.2–2.2)
Albumin: 4.4 g/dL (ref 3.7–4.7)
Alkaline Phosphatase: 64 IU/L (ref 44–121)
BUN/Creatinine Ratio: 23 (ref 12–28)
BUN: 22 mg/dL (ref 8–27)
Bilirubin Total: 1.1 mg/dL (ref 0.0–1.2)
CO2: 23 mmol/L (ref 20–29)
Calcium: 9.6 mg/dL (ref 8.7–10.3)
Chloride: 101 mmol/L (ref 96–106)
Creatinine, Ser: 0.97 mg/dL (ref 0.57–1.00)
Globulin, Total: 2.2 g/dL (ref 1.5–4.5)
Glucose: 99 mg/dL (ref 70–99)
Potassium: 5.3 mmol/L — ABNORMAL HIGH (ref 3.5–5.2)
Sodium: 137 mmol/L (ref 134–144)
Total Protein: 6.6 g/dL (ref 6.0–8.5)
eGFR: 57 mL/min/{1.73_m2} — ABNORMAL LOW (ref >59)

## 2022-09-12 ENCOUNTER — Ambulatory Visit (INDEPENDENT_AMBULATORY_CARE_PROVIDER_SITE_OTHER): Payer: Medicare Other

## 2022-09-12 DIAGNOSIS — Z23 Encounter for immunization: Secondary | ICD-10-CM

## 2022-10-12 ENCOUNTER — Other Ambulatory Visit: Payer: Self-pay | Admitting: Family Medicine

## 2022-10-12 DIAGNOSIS — K219 Gastro-esophageal reflux disease without esophagitis: Secondary | ICD-10-CM

## 2022-10-12 MED ORDER — PANTOPRAZOLE SODIUM 40 MG PO TBEC: 40.0000 mg | DELAYED_RELEASE_TABLET | Freq: Every day | ORAL | 1 refills | Status: AC

## 2022-10-12 NOTE — Telephone Encounter (Addendum)
Medication Refill - Medication:pantoprazole (PROTONIX) 40 MG tablet  Patient is all out of this med  Has the patient contacted their pharmacy? yes (Agent: If no, request that the patient contact the pharmacy for the refill. If patient does not wish to contact the pharmacy document the reason why and proceed with request.) (Agent: If yes, when and what did the pharmacy advise?)contact pcp  Preferred Pharmacy (with phone number or street name) [id number] [place] Phone: [phone]: [phone] Has the patient been seen for an appointment in the last year OR does the patient have an upcoming appointment? yes  Agent: Please be advised that RX refills may take up to 3 business days. We ask that you follow-up with your pharmacy.

## 2022-10-12 NOTE — Telephone Encounter (Signed)
Requested medication (s) are due for refill today: yes  Requested medication (s) are on the active medication list: yes  Last refill:  03/01/22  Future visit scheduled: yes  Notes to clinic:  Unable to refill per protocol, last refill by provider no longer at practice, routing for review.     Requested Prescriptions  Pending Prescriptions Disp Refills   pantoprazole (PROTONIX) 40 MG tablet 90 tablet 1    Sig: Take 1 tablet (40 mg total) by mouth daily.     Gastroenterology: Proton Pump Inhibitors Passed - 10/12/2022  4:05 PM      Passed - Valid encounter within last 12 months    Recent Outpatient Visits           3 months ago Essential hypertension   [facility] Jerrol Banana., MD   6 months ago Paroxysmal atrial fibrillation [facility])   [facility] Jerrol Banana., MD   1 year ago Essential hypertension   [facility] Jerrol Banana., MD   1 year ago Encounter for [employer] annual wellness exam   [facility] Jerrol Banana., MD   2 years ago Essential hypertension   [facility] Jerrol Banana., MD       Future Appointments             In 2 months Jerrol Banana., MD [facility] [place]   In 5 months Jerrol Banana., MD [facility] [place]   In 7 months Ralene Bathe, MD [place]

## 2022-12-19 ENCOUNTER — Ambulatory Visit: Payer: Medicare Other | Admitting: Family Medicine

## 2023-02-28 ENCOUNTER — Other Ambulatory Visit: Payer: Self-pay | Admitting: Family Medicine

## 2023-02-28 DIAGNOSIS — Z1231 Encounter for screening mammogram for malignant neoplasm of breast: Secondary | ICD-10-CM

## 2023-04-03 ENCOUNTER — Encounter: Payer: Medicare Other | Admitting: Family Medicine

## 2023-04-08 ENCOUNTER — Other Ambulatory Visit: Payer: Self-pay | Admitting: Physician Assistant

## 2023-04-08 DIAGNOSIS — K219 Gastro-esophageal reflux disease without esophagitis: Secondary | ICD-10-CM

## 2023-04-17 ENCOUNTER — Ambulatory Visit
Admission: RE | Admit: 2023-04-17 | Discharge: 2023-04-17 | Disposition: A | Discharge status: Home / Self Care | Payer: Medicare Other | Source: Ambulatory Visit | Attending: Family Medicine | Admitting: Family Medicine

## 2023-04-17 DIAGNOSIS — Z1231 Encounter for screening mammogram for malignant neoplasm of breast: Secondary | ICD-10-CM | POA: Insufficient documentation

## 2023-05-15 ENCOUNTER — Ambulatory Visit (INDEPENDENT_AMBULATORY_CARE_PROVIDER_SITE_OTHER): Payer: Medicare Other | Admitting: Dermatology

## 2023-05-15 VITALS — BP 194/87

## 2023-05-15 DIAGNOSIS — L820 Inflamed seborrheic keratosis: Secondary | ICD-10-CM

## 2023-05-15 DIAGNOSIS — L578 Other skin changes due to chronic exposure to nonionizing radiation: Secondary | ICD-10-CM

## 2023-05-15 DIAGNOSIS — L821 Other seborrheic keratosis: Secondary | ICD-10-CM

## 2023-05-15 DIAGNOSIS — D1801 Hemangioma of skin and subcutaneous tissue: Secondary | ICD-10-CM

## 2023-05-15 DIAGNOSIS — W908XXA Exposure to other nonionizing radiation, initial encounter: Secondary | ICD-10-CM

## 2023-05-15 DIAGNOSIS — Z86018 Personal history of other benign neoplasm: Secondary | ICD-10-CM

## 2023-05-15 DIAGNOSIS — L814 Other melanin hyperpigmentation: Secondary | ICD-10-CM

## 2023-05-15 DIAGNOSIS — Z1283 Encounter for screening for malignant neoplasm of skin: Secondary | ICD-10-CM | POA: Diagnosis not present

## 2023-05-15 DIAGNOSIS — X32XXXA Exposure to sunlight, initial encounter: Secondary | ICD-10-CM

## 2023-05-15 DIAGNOSIS — D229 Melanocytic nevi, unspecified: Secondary | ICD-10-CM

## 2023-05-15 NOTE — Progress Notes (Signed)
Follow-Up Visit   Subjective  Bianca Malone is a 88 y.o. female who presents for the following: Skin Cancer Screening and Full Body Skin Exam  The patient presents for Total-Body Skin Exam (TBSE) for skin cancer screening and mole check. The patient has spots, moles and lesions to be evaluated, some may be new or changing and the patient has concerns that these could be cancer.    The following portions of the chart were reviewed this encounter and updated as appropriate: medications, allergies, medical history  Review of Systems:  No other skin or systemic complaints except as noted in HPI or Assessment and Plan.  Objective  Well appearing patient in no apparent distress; mood and affect are within normal limits.  A full examination was performed including scalp, head, eyes, ears, nose, lips, neck, chest, axillae, abdomen, back, buttocks, bilateral upper extremities, bilateral lower extremities, hands, feet, fingers, toes, fingernails, and toenails. All findings within normal limits unless otherwise noted below.   Relevant physical exam findings are noted in the Assessment and Plan.  Left Lower Back x 1, left inframammary x 1, right tricep x 1, right lower leg x 1 (4) Erythematous stuck-on, waxy papule or plaque    Assessment & Plan   History of Dysplastic Nevi - left suprapubic - No evidence of recurrence today - Recommend regular full body skin exams - Recommend daily broad spectrum sunscreen SPF 30+ to sun-exposed areas, reapply every 2 hours as needed.  - Call if any new or changing lesions are noted between office visits  LENTIGINES, SEBORRHEIC KERATOSES, HEMANGIOMAS - Benign normal skin lesions - Benign-appearing - Call for any changes  MELANOCYTIC NEVI - Tan-brown and/or pink-flesh-colored symmetric macules and papules - Benign appearing on exam today - Observation - Call clinic for new or changing moles - Recommend daily use of broad spectrum spf 30+ sunscreen to  sun-exposed areas.   ACTINIC DAMAGE - Chronic condition, secondary to cumulative UV/sun exposure - diffuse scaly erythematous macules with underlying dyspigmentation - Recommend daily broad spectrum sunscreen SPF 30+ to sun-exposed areas, reapply every 2 hours as needed.  - Staying in the shade or wearing long sleeves, sun glasses (UVA+UVB protection) and wide brim hats (4-inch brim around the entire circumference of the hat) are also recommended for sun protection.  - Call for new or changing lesions.  SKIN CANCER SCREENING PERFORMED TODAY.    Inflamed seborrheic keratosis (4) Left Lower Back x 1, left inframammary x 1, right tricep x 1, right lower leg x 1  Symptomatic, irritating, patient would like treated.  Benign-appearing.  Call clinic for new or changing lesions.   Prior to procedure, discussed risks of blister formation, small wound, skin dyspigmentation, or rare scar following treatment. Recommend Vaseline ointment to treated areas while healing.   Destruction of lesion - Left Lower Back x 1, left inframammary x 1, right tricep x 1, right lower leg x 1 Complexity: simple   Destruction method: cryotherapy   Informed consent: discussed and consent obtained   Timeout:  patient name, date of birth, surgical site, and procedure verified Lesion destroyed using liquid nitrogen: Yes   Region frozen until ice ball extended beyond lesion: Yes   Outcome: patient tolerated procedure well with no complications   Post-procedure details: wound care instructions given     Return in about 1 year (around 05/14/2024) for TBSE.  I, Joanie Coddington, CMA, am acting as scribe for Armida Sans, MD .   Documentation: I have reviewed the  above documentation for accuracy and completeness, and I agree with the above.  Armida Sans, MD

## 2023-05-15 NOTE — Patient Instructions (Signed)
Cryotherapy Aftercare  Wash gently with soap and water everyday.   Apply Vaseline and Band-Aid daily until healed.     Due to recent changes in healthcare laws, you may see results of your pathology and/or laboratory studies on MyChart before the doctors have had a chance to review them. We understand that in some cases there may be results that are confusing or concerning to you. Please understand that not all results are received at the same time and often the doctors may need to interpret multiple results in order to provide you with the best plan of care or course of treatment. Therefore, we ask that you please give us 2 business days to thoroughly review all your results before contacting the office for clarification. Should we see a critical lab result, you will be contacted sooner.   If You Need Anything After Your Visit  If you have any questions or concerns for your doctor, please call our main line at [phone] and press option 4 to reach your doctor's medical assistant. If no one answers, please leave a voicemail as directed and we will return your call as soon as possible. Messages left after 4 pm will be answered the following business day.   You may also send us a message via MyChart. We typically respond to MyChart messages within 1-2 business days.  For prescription refills, please ask your pharmacy to contact our office. Our fax number is [phone].  If you have an urgent issue when the clinic is closed that cannot wait until the next business day, you can page your doctor at the number below.    Please note that while we do our best to be available for urgent issues outside of office hours, we are not available 24/7.   If you have an urgent issue and are unable to reach us, you may choose to seek medical care at your doctor's office, retail clinic, urgent care center, or emergency room.  If you have a medical emergency, please immediately call 911 or go to the  emergency department.  Pager Numbers  - Dr. Kowalski: [phone]  - Dr. Moye: [phone]  - Dr. Stewart: [phone]  In the event of inclement weather, please call our main line at [phone] for an update on the status of any delays or closures.  Dermatology Medication Tips: Please keep the boxes that topical medications come in in order to help keep track of the instructions about where and how to use these. Pharmacies typically print the medication instructions only on the boxes and not directly on the medication tubes.   If your medication is too expensive, please contact our office at [phone] option 4 or send us a message through MyChart.   We are unable to tell what your co-pay for medications will be in advance as this is different depending on your insurance coverage. However, we may be able to find a substitute medication at lower cost or fill out paperwork to get insurance to cover a needed medication.   If a prior authorization is required to get your medication covered by your insurance company, please allow us 1-2 business days to complete this process.  Drug prices often vary depending on where the prescription is filled and some pharmacies may offer cheaper prices.  The website www.goodrx.com contains coupons for medications through different pharmacies. The prices here do not account for what the cost may be with help from insurance (it may be cheaper with your insurance), but the website can   give you the price if you did not use any insurance.  - You can print the associated coupon and take it with your prescription to the pharmacy.  - You may also stop by our office during regular business hours and pick up a GoodRx coupon card.  - If you need your prescription sent electronically to a different pharmacy, notify our office through [facility] MyChart or by phone at [phone] option 4.     Si Usted Necesita Algo Despus de Su Visita  Tambin puede  enviarnos un mensaje a travs de MyChart. Por lo general respondemos a los mensajes de MyChart en el transcurso de 1 a 2 das hbiles.  Para renovar recetas, por favor pida a su farmacia que se ponga en contacto con nuestra oficina. Nuestro nmero de fax es el [phone].  Si tiene un asunto urgente cuando la clnica est cerrada y que no puede esperar hasta el siguiente da hbil, puede llamar/localizar a su doctor(a) al nmero que aparece a continuacin.   Por favor, tenga en cuenta que aunque hacemos todo lo posible para estar disponibles para asuntos urgentes fuera del horario de oficina, no estamos disponibles las 24 horas del da, los 7 das de la semana.   Si tiene un problema urgente y no puede comunicarse con nosotros, puede optar por buscar atencin mdica  en el consultorio de su doctor(a), en una clnica privada, en un centro de atencin urgente o en una sala de emergencias.  Si tiene una emergencia mdica, por favor llame inmediatamente al 911 o vaya a la sala de emergencias.  Nmeros de bper  - Dr. Kowalski: [phone]  - Dra. Moye: [phone]  - Dra. Stewart: [phone]  En caso de inclemencias del tiempo, por favor llame a nuestra lnea principal al [phone] para una actualizacin sobre el estado de cualquier retraso o cierre.  Consejos para la medicacin en dermatologa: Por favor, guarde las cajas en las que vienen los medicamentos de uso tpico para ayudarle a seguir las instrucciones sobre dnde y cmo usarlos. Las farmacias generalmente imprimen las instrucciones del medicamento slo en las cajas y no directamente en los tubos del medicamento.   Si su medicamento es muy caro, por favor, pngase en contacto con nuestra oficina llamando al [phone] y presione la opcin 4 o envenos un mensaje a travs de MyChart.   No podemos decirle cul ser su copago por los medicamentos por adelantado ya que esto es diferente dependiendo de la cobertura de su seguro.  Sin embargo, es posible que podamos encontrar un medicamento sustituto a menor costo o llenar un formulario para que el seguro cubra el medicamento que se considera necesario.   Si se requiere una autorizacin previa para que su compaa de seguros cubra su medicamento, por favor permtanos de 1 a 2 das hbiles para completar este proceso.  Los precios de los medicamentos varan con frecuencia dependiendo del lugar de dnde se surte la receta y alguna farmacias pueden ofrecer precios ms baratos.  El sitio web www.goodrx.com tiene cupones para medicamentos de diferentes farmacias. Los precios aqu no tienen en cuenta lo que podra costar con la ayuda del seguro (puede ser ms barato con su seguro), pero el sitio web puede darle el precio si no utiliz ningn seguro.  - Puede imprimir el cupn correspondiente y llevarlo con su receta a la farmacia.  - Tambin puede pasar por nuestra oficina durante el horario de atencin regular y recoger una tarjeta de cupones de GoodRx.  -   Si necesita que su receta se enve electrnicamente a una farmacia diferente, informe a nuestra oficina a travs de MyChart de [place] o por telfono llamando al [phone] y presione la opcin 4.  

## 2023-05-17 ENCOUNTER — Encounter: Payer: Self-pay | Admitting: Dermatology

## 2024-03-16 ENCOUNTER — Other Ambulatory Visit: Payer: Self-pay | Admitting: Family Medicine

## 2024-03-16 DIAGNOSIS — Z1231 Encounter for screening mammogram for malignant neoplasm of breast: Secondary | ICD-10-CM

## 2024-04-10 ENCOUNTER — Encounter (HOSPITAL_COMMUNITY): Payer: Self-pay

## 2024-04-17 ENCOUNTER — Ambulatory Visit
Admission: RE | Admit: 2024-04-17 | Discharge: 2024-04-17 | Disposition: A | Discharge status: Home / Self Care | Source: Ambulatory Visit | Attending: Family Medicine | Admitting: Family Medicine

## 2024-04-17 DIAGNOSIS — Z1231 Encounter for screening mammogram for malignant neoplasm of breast: Secondary | ICD-10-CM | POA: Insufficient documentation

## 2024-05-21 ENCOUNTER — Encounter: Payer: Self-pay | Admitting: Dermatology

## 2024-05-21 ENCOUNTER — Ambulatory Visit: Payer: Medicare Other | Admitting: Dermatology

## 2024-05-21 DIAGNOSIS — L578 Other skin changes due to chronic exposure to nonionizing radiation: Secondary | ICD-10-CM

## 2024-05-21 DIAGNOSIS — L820 Inflamed seborrheic keratosis: Secondary | ICD-10-CM | POA: Diagnosis not present

## 2024-05-21 DIAGNOSIS — Z1283 Encounter for screening for malignant neoplasm of skin: Secondary | ICD-10-CM

## 2024-05-21 DIAGNOSIS — L814 Other melanin hyperpigmentation: Secondary | ICD-10-CM

## 2024-05-21 DIAGNOSIS — D1801 Hemangioma of skin and subcutaneous tissue: Secondary | ICD-10-CM

## 2024-05-21 DIAGNOSIS — Z7189 Other specified counseling: Secondary | ICD-10-CM

## 2024-05-21 DIAGNOSIS — Z86018 Personal history of other benign neoplasm: Secondary | ICD-10-CM

## 2024-05-21 DIAGNOSIS — L821 Other seborrheic keratosis: Secondary | ICD-10-CM

## 2024-05-21 DIAGNOSIS — L729 Follicular cyst of the skin and subcutaneous tissue, unspecified: Secondary | ICD-10-CM

## 2024-05-21 DIAGNOSIS — Z79899 Other long term (current) drug therapy: Secondary | ICD-10-CM

## 2024-05-21 DIAGNOSIS — D229 Melanocytic nevi, unspecified: Secondary | ICD-10-CM

## 2024-05-21 DIAGNOSIS — L720 Epidermal cyst: Secondary | ICD-10-CM

## 2024-05-21 DIAGNOSIS — W908XXA Exposure to other nonionizing radiation, initial encounter: Secondary | ICD-10-CM | POA: Diagnosis not present

## 2024-05-21 NOTE — Patient Instructions (Addendum)
 For milia: Recommend OTC Differin gel (will be in the acne section at the drug store. Use pea-sized amount.   Topical retinoid medications like tretinoin/Retin-A, adapalene/Differin, tazarotene/Fabior, and Epiduo/Epiduo Forte can cause dryness and irritation when first started. Only apply a pea-sized amount to the entire affected area. Avoid applying it around the eyes, edges of mouth and creases at the nose. If you experience irritation, use a good moisturizer first and/or apply the medicine less often. If you are doing well with the medicine, you can increase how often you use it until you are applying every night. Be careful with sun protection while using this medication as it can make you sensitive to the sun. This medicine should not be used by pregnant women.      Cryotherapy Aftercare  Wash gently with soap and water everyday.   Apply Vaseline and Band-Aid daily until healed.    Recommend daily broad spectrum sunscreen SPF 30+ to sun-exposed areas, reapply every 2 hours as needed. Call for new or changing lesions.  Staying in the shade or wearing long sleeves, sun glasses (UVA+UVB protection) and wide brim hats (4-inch brim around the entire circumference of the hat) are also recommended for sun protection.    Melanoma ABCDEs  Melanoma is the most dangerous type of skin cancer, and is the leading cause of death from skin disease.  You are more likely to develop melanoma if you: Have light-colored skin, light-colored eyes, or red or blond hair Spend a lot of time in the sun Tan regularly, either outdoors or in a tanning bed Have had blistering sunburns, especially during childhood Have a close family member who has had a melanoma Have atypical moles or large birthmarks  Early detection of melanoma is key since treatment is typically straightforward and cure rates are extremely high if we catch it early.   The first sign of melanoma is often a change in a mole or a new dark spot.   The ABCDE system is a way of remembering the signs of melanoma.  A for asymmetry:  The two halves do not match. B for border:  The edges of the growth are irregular. C for color:  A mixture of colors are present instead of an even brown color. D for diameter:  Melanomas are usually (but not always) greater than 6mm - the size of a pencil eraser. E for evolution:  The spot keeps changing in size, shape, and color.  Please check your skin once per month between visits. You can use a small mirror in front and a large mirror behind you to keep an eye on the back side or your body.   If you see any new or changing lesions before your next follow-up, please call to schedule a visit.  Please continue daily skin protection including broad spectrum sunscreen SPF 30+ to sun-exposed areas, reapplying every 2 hours as needed when you're outdoors.   Staying in the shade or wearing long sleeves, sun glasses (UVA+UVB protection) and wide brim hats (4-inch brim around the entire circumference of the hat) are also recommended for sun protection.     Due to recent changes in healthcare laws, you may see results of your pathology and/or laboratory studies on MyChart before the doctors have had a chance to review them. We understand that in some cases there may be results that are confusing or concerning to you. Please understand that not all results are received at the same time and often the doctors may need  to interpret multiple results in order to provide you with the best plan of care or course of treatment. Therefore, we ask that you please give us  2 business days to thoroughly review all your results before contacting the office for clarification. Should we see a critical lab result, you will be contacted sooner.   If You Need Anything After Your Visit  If you have any questions or concerns for your doctor, please call our main line at [phone] and press option 4 to reach your doctor's medical  assistant. If no one answers, please leave a voicemail as directed and we will return your call as soon as possible. Messages left after 4 pm will be answered the following business day.   You may also send us  a message via MyChart. We typically respond to MyChart messages within 1-2 business days.  For prescription refills, please ask your pharmacy to contact our office. Our fax number is [phone].  If you have an urgent issue when the clinic is closed that cannot wait until the next business day, you can page your doctor at the number below.    Please note that while we do our best to be available for urgent issues outside of office hours, we are not available 24/7.   If you have an urgent issue and are unable to reach us , you may choose to seek medical care at your doctor's office, retail clinic, urgent care center, or emergency room.  If you have a medical emergency, please immediately call 911 or go to the emergency department.  Pager Numbers  - Dr. Bary Likes: [phone]  - Dr. Annette Barters: [phone]  - Dr. Felipe Horton: [phone]   In the event of inclement weather, please call our main line at [phone] for an update on the status of any delays or closures.  Dermatology Medication Tips: Please keep the boxes that topical medications come in in order to help keep track of the instructions about where and how to use these. Pharmacies typically print the medication instructions only on the boxes and not directly on the medication tubes.   If your medication is too expensive, please contact our office at [phone] option 4 or send us  a message through MyChart.   We are unable to tell what your co-pay for medications will be in advance as this is different depending on your insurance coverage. However, we may be able to find a substitute medication at lower cost or fill out paperwork to get insurance to cover a needed medication.   If a prior authorization is required to get  your medication covered by your insurance company, please allow us  1-2 business days to complete this process.  Drug prices often vary depending on where the prescription is filled and some pharmacies may offer cheaper prices.  The website www.goodrx.com contains coupons for medications through different pharmacies. The prices here do not account for what the cost may be with help from insurance (it may be cheaper with your insurance), but the website can give you the price if you did not use any insurance.  - You can print the associated coupon and take it with your prescription to the pharmacy.  - You may also stop by our office during regular business hours and pick up a GoodRx coupon card.  - If you need your prescription sent electronically to a different pharmacy, notify our office through [facility] or by phone at [phone] option 4.     Si Usted Necesita Algo Despus de  Su Visita  Tambin puede enviarnos un mensaje a travs de MyChart. Por lo general respondemos a los mensajes de MyChart en el transcurso de 1 a 2 das hbiles.  Para renovar recetas, por favor pida a su farmacia que se ponga en contacto con nuestra oficina. Franz Jacks de fax es [place] [phone].  Si tiene un asunto urgente cuando la clnica est cerrada y que no puede esperar hasta el siguiente da hbil, puede llamar/localizar a su doctor(a) al nmero que aparece a continuacin.   Por favor, tenga en cuenta que aunque hacemos todo lo posible para estar disponibles para asuntos urgentes fuera del horario de [place], no estamos disponibles las 24 horas del da, los 7 [address]  [address] de la [place].   Si tiene un problema urgente y no puede comunicarse con nosotros, puede optar por buscar atencin mdica  en el consultorio de su doctor(a), en una clnica privada, en un centro de atencin urgente o en una sala de emergencias.  Si tiene Engineer, drilling, por favor llame inmediatamente al 911 o vaya a la sala de  emergencias.  Nmeros de bper  - Dr. Bary Likes: [phone]  - Dra. Annette Barters: [phone]  - Dr. Felipe Horton: [phone]   En caso de inclemencias del tiempo, por favor llame a Lajuan Pila principal al [phone] para una actualizacin sobre el [place] de cualquier retraso o cierre.  Consejos para la medicacin en dermatologa: Por favor, guarde las cajas en las que vienen los medicamentos de uso tpico para ayudarle a seguir las instrucciones sobre dnde y cmo usarlos. Las farmacias generalmente imprimen las instrucciones del medicamento slo en las cajas y no directamente en los tubos del [place].   Si su medicamento es muy caro, por favor, pngase en contacto con Bettyjane Brunet llamando al [phone] y presione la opcin 4 o envenos un mensaje a travs de Clinical cytogeneticist.   No podemos decirle cul ser su copago por los medicamentos por adelantado ya que esto es diferente dependiendo de la cobertura de su seguro. Sin embargo, es posible que podamos encontrar un medicamento sustituto a Audiological scientist un formulario para que el seguro cubra el medicamento que se considera necesario.   Si se requiere una autorizacin previa para que su compaa de seguros [place] su medicamento, por favor permtanos de 1 a 2 das hbiles para completar este proceso.  Los precios de los medicamentos varan con frecuencia dependiendo del Environmental consultant de dnde se surte la receta y alguna farmacias pueden ofrecer precios ms baratos.  El sitio web www.goodrx.com tiene cupones para medicamentos de Health and safety inspector. Los precios aqu no tienen en cuenta lo que podra costar con la ayuda del seguro (puede ser ms barato con su seguro), pero el sitio web puede darle el precio si no utiliz Tourist information centre manager.  - Puede imprimir el cupn correspondiente y llevarlo con su receta a la farmacia.  - Tambin puede pasar por nuestra oficina durante el horario de atencin regular y Education officer, museum una tarjeta de cupones de GoodRx.  - Si  necesita que su receta se enve electrnicamente a una farmacia diferente, informe a nuestra oficina a travs de MyChart de [place] o por telfono llamando al [phone] y presione la opcin 4.

## 2024-05-21 NOTE — Progress Notes (Signed)
 Follow-Up Visit   Subjective  Bianca Malone is a 89 y.o. female who presents for the following: Skin Cancer Screening and Full Body Skin Exam. Patient does have places at face, chest she would like looked at. Patient with hx of dysplastic nevus.   The patient presents for Total-Body Skin Exam (TBSE) for skin cancer screening and mole check. The patient has spots, moles and lesions to be evaluated, some may be new or changing and the patient may have concern these could be cancer.  The following portions of the chart were reviewed this encounter and updated as appropriate: medications, allergies, medical history  Review of Systems:  No other skin or systemic complaints except as noted in HPI or Assessment and Plan.  Objective  Well appearing patient in no apparent distress; mood and affect are within normal limits.  A full examination was performed including scalp, head, eyes, ears, nose, lips, neck, chest, axillae, abdomen, back, buttocks, bilateral upper extremities, bilateral lower extremities, hands, feet, fingers, toes, fingernails, and toenails. All findings within normal limits unless otherwise noted below.   Relevant physical exam findings are noted in the Assessment and Plan.  R neck x4, R forehead x4, legs x8, chets x1 (17) Stuck on waxy paps with erythema  Assessment & Plan   SKIN CANCER SCREENING PERFORMED TODAY.  ACTINIC DAMAGE - Chronic condition, secondary to cumulative UV/sun exposure - diffuse scaly erythematous macules with underlying dyspigmentation - Recommend daily broad spectrum sunscreen SPF 30+ to sun-exposed areas, reapply every 2 hours as needed.  - Staying in the shade or wearing long sleeves, sun glasses (UVA+UVB protection) and wide brim hats (4-inch brim around the entire circumference of the hat) are also recommended for sun protection.  - Call for new or changing lesions.  LENTIGINES, SEBORRHEIC KERATOSES, HEMANGIOMAS - Benign normal skin lesions -  Benign-appearing - Call for any changes  HEMANGIOMA Exam: red papule(s) at L nasal tip  Discussed benign nature. Recommend observation. Call for changes. Counseling for BBL / IPL / Laser and Coordination of Care Discussed the treatment option of Broad Band Light (BBL) /Intense Pulsed Light (IPL)/ Laser for skin discoloration, including brown spots and redness.  Typically we recommend at least 1-3 treatment sessions about 5-8 weeks apart for best results.  Cannot have tanned skin when BBL performed, and regular use of sunscreen/photoprotection is advised after the procedure to help maintain results. The patient's condition may also require maintenance treatments in the future.  The fee for BBL / laser treatments is $350 per treatment session for the whole face.  A fee can be quoted for other parts of the body.  Insurance typically does not pay for BBL/laser treatments and therefore the fee is an out-of-pocket cost. Recommend prophylactic valtrex treatment. Once scheduled for procedure, will send Rx in prior to patient's appointment.   MELANOCYTIC NEVI - Tan-brown and/or pink-flesh-colored symmetric macules and papules - Benign appearing on exam today - Observation - Call clinic for new or changing moles - Recommend daily use of broad spectrum spf 30+ sunscreen to sun-exposed areas.   History of Dysplastic Nevi Left suprapubic- 08/31/2008 - No evidence of recurrence today - Recommend regular full body skin exams - Recommend daily broad spectrum sunscreen SPF 30+ to sun-exposed areas, reapply every 2 hours as needed.  - Call if any new or changing lesions are noted between office visits  Milia - tiny firm white papules - type of cyst - benign - sometimes these will clear with nightly OTC  adapalene/Differin 0.1% gel or retinol. - may be extracted if symptomatic - observe - May also consider starting tretinoin 0.025%, patient would like to try sample of Differin first then let us  know.  Samples of Aklief 0.005% cream x4 given to patient.   Topical retinoid medications like tretinoin/Retin-A, adapalene/Differin, tazarotene/Fabior, and Epiduo/Epiduo Forte can cause dryness and irritation when first started. Only apply a pea-sized amount to the entire affected area. Avoid applying it around the eyes, edges of mouth and creases at the nose. If you experience irritation, use a good moisturizer first and/or apply the medicine less often. If you are doing well with the medicine, you can increase how often you use it until you are applying every night. Be careful with sun protection while using this medication as it can make you sensitive to the sun. This medicine should not be used by pregnant women.    INFLAMED SEBORRHEIC KERATOSIS (17) R neck x4, R forehead x4, legs x8, chets x1 (17) Symptomatic, irritating, patient would like treated. Destruction of lesion - R neck x4, R forehead x4, legs x8, chets x1 (17) Complexity: simple   Destruction method: cryotherapy   Informed consent: discussed and consent obtained   Timeout:  patient name, date of birth, surgical site, and procedure verified Lesion destroyed using liquid nitrogen: Yes   Region frozen until ice ball extended beyond lesion: Yes   Outcome: patient tolerated procedure well with no complications   Post-procedure details: wound care instructions given    Return in 1 year (on 05/21/2025) for w/ Dr. Bary Likes, HxDysplastic Nevi, TBSE.  I, Jacquelynn V. Grier Leber, CMA, am acting as scribe for Celine Collard, MD   Documentation: I have reviewed the above documentation for accuracy and completeness, and I agree with the above.  Celine Collard, MD

## 2024-09-11 ENCOUNTER — Emergency Department
Admission: EM | Admit: 2024-09-11 | Discharge: 2024-09-11 | Disposition: A | Discharge status: Home / Self Care | Attending: Emergency Medicine | Admitting: Emergency Medicine

## 2024-09-11 ENCOUNTER — Other Ambulatory Visit: Payer: Self-pay

## 2024-09-11 DIAGNOSIS — I10 Essential (primary) hypertension: Secondary | ICD-10-CM | POA: Insufficient documentation

## 2024-09-11 DIAGNOSIS — Z79899 Other long term (current) drug therapy: Secondary | ICD-10-CM | POA: Insufficient documentation

## 2024-09-11 DIAGNOSIS — I4891 Unspecified atrial fibrillation: Secondary | ICD-10-CM | POA: Insufficient documentation

## 2024-09-11 DIAGNOSIS — Z7901 Long term (current) use of anticoagulants: Secondary | ICD-10-CM | POA: Insufficient documentation

## 2024-09-11 DIAGNOSIS — R000 Tachycardia, unspecified: Secondary | ICD-10-CM | POA: Diagnosis present

## 2024-09-11 LAB — BASIC METABOLIC PANEL WITH GFR
Anion gap: 10 (ref 5–15)
BUN: 28 mg/dL — ABNORMAL HIGH (ref 8–23)
CO2: 25 mmol/L (ref 22–32)
Calcium: 9.7 mg/dL (ref 8.9–10.3)
Chloride: 102 mmol/L (ref 98–111)
Creatinine, Ser: 1.07 mg/dL — ABNORMAL HIGH (ref 0.44–1.00)
GFR, Estimated: 50 mL/min — ABNORMAL LOW (ref >60)
Glucose, Bld: 118 mg/dL — ABNORMAL HIGH (ref 70–99)
Potassium: 3.7 mmol/L (ref 3.5–5.1)
Sodium: 137 mmol/L (ref 135–145)

## 2024-09-11 LAB — CBC WITH DIFFERENTIAL/PLATELET
Abs Immature Granulocytes: 0.04 K/uL (ref 0.00–0.07)
Basophils Absolute: 0.1 K/uL (ref 0.0–0.1)
Basophils Relative: 1 %
Eosinophils Absolute: 0.4 K/uL (ref 0.0–0.5)
Eosinophils Relative: 6 %
HCT: 38.6 % (ref 36.0–46.0)
Hemoglobin: 12.9 g/dL (ref 12.0–15.0)
Immature Granulocytes: 1 %
Lymphocytes Relative: 18 %
Lymphs Abs: 1.4 K/uL (ref 0.7–4.0)
MCH: 30.6 pg (ref 26.0–34.0)
MCHC: 33.4 g/dL (ref 30.0–36.0)
MCV: 91.5 fL (ref 80.0–100.0)
Monocytes Absolute: 0.7 K/uL (ref 0.1–1.0)
Monocytes Relative: 10 %
Neutro Abs: 5 K/uL (ref 1.7–7.7)
Neutrophils Relative %: 64 %
Platelets: 250 K/uL (ref 150–400)
RBC: 4.22 MIL/uL (ref 3.87–5.11)
RDW: 13.2 % (ref 11.5–15.5)
WBC: 7.7 K/uL (ref 4.0–10.5)
nRBC: 0 % (ref 0.0–0.2)

## 2024-09-11 LAB — MAGNESIUM: Magnesium: 2 mg/dL (ref 1.7–2.4)

## 2024-09-11 MED ORDER — DILTIAZEM HCL 60 MG PO TABS
60.0000 mg | ORAL_TABLET | Freq: Once | ORAL | Status: AC
  Administered 2024-09-11: 60 mg via ORAL
  Filled 2024-09-11: qty 1

## 2024-09-11 MED ORDER — LACTATED RINGERS IV BOLUS
1000.0000 mL | Freq: Once | INTRAVENOUS | Status: AC
  Administered 2024-09-11: 1000 mL via INTRAVENOUS

## 2024-09-11 MED ORDER — DILTIAZEM HCL 25 MG/5ML IV SOLN
20.0000 mg | Freq: Once | INTRAVENOUS | Status: AC
  Administered 2024-09-11: 20 mg via INTRAVENOUS
  Filled 2024-09-11: qty 5

## 2024-09-11 MED ORDER — DILTIAZEM HCL 25 MG/5ML IV SOLN
10.0000 mg | Freq: Once | INTRAVENOUS | Status: AC
  Administered 2024-09-11: 10 mg via INTRAVENOUS
  Filled 2024-09-11: qty 5

## 2024-09-11 NOTE — ED Triage Notes (Signed)
 Chief Complaint  Patient presents with   Hypertension   Atrial Fibrillation    Pt presents to the ED from home after having increase blood pressure. Pt also reported that she has a history of a-fib. Pt HR noted to be 120-180 upon transfer with EMS. EMS reports initial BP was 197/102. Pt recently just started taking Eliquis and Cardizem within the last few days.

## 2024-09-11 NOTE — ED Provider Notes (Signed)
 [facility] Provider Note    Event Date/Time   First MD Initiated Contact with Patient 09/11/24 [phone]     (approximate)   History   Hypertension and Atrial Fibrillation   HPI  Bianca Malone is a 89 y.o. female who presents to the ED for evaluation of Hypertension and Atrial Fibrillation   Reviewed cardiology clinic visit from 2 days ago.  History of paroxysmal A-fib on Eliquis.  In the office was notably tachycardic on EKG to 166 bpm, asymptomatic.  Patient started on diltiazem short acting 60 mg twice per day.  Patient presents to the ED due to hypertension at home.  Patient reports calling 911 due to asymptomatic hypertension.  She routinely checked her blood pressure this evening and noted systolic 140s, which perturbed her so she repeatedly checked her blood pressure over and over until systolic was 180 so she called 911 because she wanted to make sure that she would not have a stroke.  She presents to the ED asymptomatic and notably with rapid A-fib.  She reports starting the diltiazem today taking 2 doses, persistent compliance with Eliquis   Physical Exam   Triage Vital Signs: ED Triage Vitals  Encounter Vitals Group     BP      Girls Systolic BP Percentile      Girls Diastolic BP Percentile      Boys Systolic BP Percentile      Boys Diastolic BP Percentile      Pulse      Resp      Temp      Temp src      SpO2      Weight      Height      Head Circumference      Peak Flow      Pain Score      Pain Loc      Pain Education      Exclude from Growth Chart     Most recent vital signs: Vitals:   09/11/24 0445 09/11/24 0608  BP:  138/67  Pulse: 82 97  Resp: 18 19  Temp:  (!) 97.5 F (36.4 C)  SpO2: 99% 98%    General: Awake, no distress.  Well-appearing CV:  Good peripheral perfusion.  Tachycardic and irregular Resp:  Normal effort.  Abd:  No distention.  MSK:  No deformity noted.  Neuro:  No focal deficits  appreciated. Other:     ED Results / Procedures / Treatments   Labs (all labs ordered are listed, but only abnormal results are displayed) Labs Reviewed  BASIC METABOLIC PANEL WITH GFR - Abnormal; Notable for the following components:      Result Value   Glucose, Bld 118 (*)    BUN 28 (*)    Creatinine, Ser 1.07 (*)    GFR, Estimated 50 (*)    All other components within normal limits  CBC WITH DIFFERENTIAL/PLATELET  MAGNESIUM    EKG A-fib with RVR, rate of 156 bpm.  Couple PVCs.  No STEMI  RADIOLOGY   Official radiology report(s): No results found.  PROCEDURES and INTERVENTIONS:  .1-3 Lead EKG Interpretation  Performed by: Claudene Rover, MD Authorized by: Claudene Rover, MD     Interpretation: abnormal     ECG rate:  144   ECG rate assessment: tachycardic     Rhythm: atrial fibrillation     Ectopy: none     Conduction: normal   .Critical Care  Performed by:  Claudene Rover, MD Authorized by: Claudene Rover, MD   Critical care provider statement:    Critical care time (minutes):  30   Critical care time was exclusive of:  Separately billable procedures and treating other patients   Critical care was necessary to treat or prevent imminent or life-threatening deterioration of the following conditions:  Cardiac failure and circulatory failure   Critical care was time spent personally by me on the following activities:  Development of treatment plan with patient or surrogate, discussions with consultants, evaluation of patient's response to treatment, examination of patient, ordering and review of laboratory studies, ordering and review of radiographic studies, ordering and performing treatments and interventions, pulse oximetry, re-evaluation of patient's condition and review of old charts   Medications  lactated ringers  bolus 1,000 mL (0 mLs Intravenous Stopped 09/11/24 0246)  diltiazem (CARDIZEM) injection 20 mg (20 mg Intravenous Given 09/11/24 0139)  diltiazem  (CARDIZEM) tablet 60 mg (60 mg Oral Given 09/11/24 0258)  diltiazem (CARDIZEM) injection 10 mg (10 mg Intravenous Given 09/11/24 0259)     IMPRESSION / MDM / ASSESSMENT AND PLAN / ED COURSE  I reviewed the triage vital signs and the nursing notes.  Differential diagnosis includes, but is not limited to, dehydration, medication noncompliance, electrolyte derangement, symptomatic anemia  {Patient presents with symptoms of an acute illness or injury that is potentially life-threatening.  Patient presents with asymptomatic hypertension, found to be in rapid A-fib.  Looks well without signs of decompensated heart failure.  Provide oral and IV dosing of her recently initiated diltiazem with good improvement of her rates.  Does seem to remain in A-fib, rates 80s-90s.  I considered admission for this patient but she is adamant about going home, which is reasonable considering she is asymptomatic.  She has normal electrolytes and CBC.  Discussed continuing her diltiazem and ED return precautions.  Clinical Course as of 09/11/24 0730  Fri Sep 11, 2024  0251 Reassessed. Feeling fine. Looks well, walking back to stretcher from toilet. We discussed plan of care. She wants to go home [DS]  0328 Reviewing telemetry, improved rates in the 90s, some sinus beats but mostly remaining in A-fib [DS]  0545 Reassessed.  Rates 80s-90s and seems to be remaining in A-fib.  She is asymptomatic and demanding discharge.  We discussed cardiology follow-up and ED return precautions [DS]    Clinical Course User Index [DS] Claudene Rover, MD     FINAL CLINICAL IMPRESSION(S) / ED DIAGNOSES   Final diagnoses:  Atrial fibrillation with RVR (HCC)     Rx / DC Orders   ED Discharge Orders     None        Note:  This document was prepared using Dragon voice recognition software and may include unintentional dictation errors.   Claudene Rover, MD 09/11/24 0730

## 2024-09-11 NOTE — ED Notes (Signed)
 Pt given warm blanket and pillow upon request. Pt denies further needs at this time.

## 2024-09-11 NOTE — Discharge Instructions (Signed)
 Continue to take both the Eliquis blood thinner and diltiazem heart rate/blood pressure medication every day twice daily.  Follow-up with a cardiologist in the clinic  Return to the ED with any worsening symptoms

## 2024-09-17 NOTE — Progress Notes (Signed)
 Established Patient Visit   Chief Complaint: Chief Complaint  Patient presents with  . Hypertension  . Atrial Fibrillation    [facility]   Date of Service: 09/17/2024 Date of Birth: 08/07/1935 PCP: Bertrum Charlie Raring, MD  History of Present Illness: [name] is a 89 y.o.female patient returns for   1.  Paroxysmal atrial fibrillation  2.  Essential hypertension  3.  Sinus bradycardia on metoprolol  succinate  At the initial office visit on 07/18/2018, the patient reported a 1 to 67-month history of new onset exertional dyspnea.  She had also experienced an episode of chest pain, described as substernal chest tightness with exertion, relieved with rest.  She also reported occasional palpitations.  The patient had an office visit with her primary care provider on 07/16/2018, which time her blood pressure was noted to be elevated at 160/60.  Hydralazine  was added to her losartan  at that visit.  Her blood pressures have overall improved.  The patient underwent a stress echocardiogram on 07/31/2018.  She exercised for 4:31 minutes without chest pain or ECG changes.  At baseline, 2D echocardiogram revealed normal left ventricular function, with LVEF greater than 55%.  At peak exercise there was appropriate augmentation of all myocardial segments, without evidence of ischemia.  The patient was seen 01/31/2022 at which time she reported occasional palpitations, and episodes of heart racing sometimes occurring while walking after eating.    72-hour Holter monitor 01/31/2022 - 02/03/2022 revealed predominant sinus rhythm with mean heart rate of 71 bpm, paroxysmal atrial fibrillation with AF burden 5.4% and longest episode 1 hour and 41 minutes with frequent premature atrial contractions.  The patient recently experienced a bradycardia with lightheadedness and dizziness, which resolved after discontinuing metoprolol  succinate.  History of Present Illness Bianca Malone returns today for hospital follow-up. At the last  visit, the patient was noted to be in rapid atrial fibrillation at a rate of 166 bpm. She was started on diltiazem  60 mg BID. On 09/11/2024, her heart rate increased to between 120 to 180 bpm around 11:30 PM, approximately four hours after taking her evening dose of diltiazem . Unable to control her heart rate at home, she called EMS and was transported to the hospital, where she received IV and oral diltiazem  improving her heart rate to the 80s and 90s, although she remained in atrial fibrillation.  Since discharge from the ED six days ago, her heart rate and blood pressure have been stable, but she reports significant swelling in her feet, making it difficult to wear shoes. She reports that the swelling began after starting diltiazem . Additionally, she describes a sensation of heaviness in her head. Her blood pressure readings at home are typically around 129/60 mmHg with a pulse of 67 bpm, and she monitors these regularly. ECG today shows atrial fibrillation at a rate of 134 bpm. She did not take her morning diltiazem  yet today.  Her medication regimen includes losartan  and Eliquis , in addition to the diltiazem . She has a history of being on metoprolol  in the past, which was discontinued due to side effects of bradycardia, lightheadedness, and dizziness. She is concerned about medication side effects impacting her active lifestyle, noting previous experiences of fatigue and sleepiness with other medications.  She lives alone. She is physically active, and has a trainer and engages in regular exercise. She has used compression stockings in the past for swelling but not recently due to difficulty taking them off. She denies chest pain. She has mild chronic exertional dyspnea when climbing  stairs.  Previous 7-day Holter monitor 03/22/2023 - 03/29/2023 revealed predominant sinus rhythm with mean heart rate of 83 bpm, sinus heart rate range 37 to 109 bpm, paroxysmal atrial fibrillation with AF burden 17%, longest  episode 3 hours and 22 minutes, frequent premature atrial contractions, occasional premature ventricular contractions.  The patient has paroxysmal atrial fibrillation as noted on 72-hour Holter monitor with CHA2DS2-VASc score of 4, started on Eliquis  during her last office visit, which is tolerated well without apparent side effects.  Patient unable to tolerate metoprolol  succinate which caused bradycardia with fatigue.  The patient has essential hypertension, blood pressure normal, currently on losartan  and diltiazem .  Past Medical and Surgical History  Past Medical History Past Medical History:  Diagnosis Date  . Arthritis   . Endometrial polyp   . Gastroesophageal reflux disease without esophagitis 01/22/2019  . GERD (gastroesophageal reflux disease)   . HH (hiatus hernia) 01/22/2019  . Hypertension   . Sensation of lump in throat 01/22/2019  . Vaginal prolapse     Past Surgical History She has a past surgical history that includes removal adhesion at lower small intestine; thyroglossal duct cyst removal; Endoscopic Carpal Tunnel Release; Breast surgery; egd (04/23/2007); Colonoscopy (03/03/2002); Colonoscopy (04/23/2007); egd (07/22/2017); and Colonoscopy (07/22/2017).   Medications and Allergies  Current Medications  Current Outpatient Medications  Medication Sig Dispense Refill  . acetaminophen  (TYLENOL  8 HOUR ORAL) Take by mouth as needed    . calcium carbonate-vitamin D3 (OS-CAL 500+D) 500 mg(1,250mg ) -200 unit tablet Take 1 tablet by mouth 2 (two) times daily with meals.    . dicyclomine  (BENTYL ) 20 mg tablet Take 1 tablet (20 mg total) by mouth 4 (four) times daily 30 tablet 3  . dilTIAZem  (CARDIZEM ) 60 MG immediate release tablet Take 1 tablet (60 mg total) by mouth 2 (two) times daily at lunch and dinner 60 tablet 11  . ELIQUIS  2.5 mg tablet TAKE 1 TABLET BY MOUTH EVERY 12 HOURS. 60 tablet 6  . losartan  (COZAAR ) 50 MG tablet TAKE 1 TABLET BY MOUTH EVERY DAY 90 tablet 3  .  pantoprazole  (PROTONIX ) 40 MG DR tablet TAKE 1 TABLET BY MOUTH EVERY DAY 90 tablet 1  . polyethylene glycol (MIRALAX) packet Take 17 g by mouth once daily. Mix in 4-8ounces of fluid prior to taking.    SABRA PROLENSA 0.07 % ophthalmic solution Place 1 drop into the left eye 2 (two) times daily    . promethazine  (PHENERGAN ) 25 MG tablet TAKE 1 TABLET (25 MG TOTAL) BY MOUTH ONCE DAILY AS NEEDED FOR NAUSEA 60 tablet 2   No current facility-administered medications for this visit.    Allergies: Loratadine, Other, Amoxicillin, Oxaprozin, and Sulfa (sulfonamide antibiotics)  Social and Family History  Social History  reports that she has never smoked. She has never used smokeless tobacco. She reports current alcohol  use of about 2.0 standard drinks of alcohol  per week. She reports that she does not use drugs.  Family History Family History  Problem Relation Name Age of Onset  . High blood pressure (Hypertension) Mother    . Kidney failure Mother    . High blood pressure (Hypertension) Father    . High blood pressure (Hypertension) Brother    . Heart failure Brother    . High blood pressure (Hypertension) Brother    . Leukemia Brother      Review of Systems   Review of Systems: The patient denies chest pain, with mild chronic exertional shortness of breath, without orthopnea, paroxysmal nocturnal  dyspnea,pedal edema, with occasional palpitations, without prolonged heart racing, presyncope, syncope, with anxiety, with occasional throat tightness associated with anxiety. Review of 10 Systems is negative except as described above.  Physical Examination   Vitals:Pulse (!) 134   Ht 165.1 cm (5' 5)   Wt 58.1 kg (128 lb)   SpO2 98%   BMI 21.30 kg/m  Ht:165.1 cm (5' 5) Wt:58.1 kg (128 lb) ADJ:Anib surface area is 1.63 meters squared. Body mass index is 21.3 kg/m.  General: Alert and oriented.  Anxious-appearing. No acute distress.  Appears younger than stated age. HEENT: Pupils equally  reactive to light and accomodation    Neck: Supple, no JVD Lungs: Normal effort of breathing; clear to auscultation bilaterally; no wheezes, rales, rhonchi Heart: rapid, irregular. No murmur, rub, or gallop Abdomen: nondistended,  bowel sounds present Extremities: no cyanosis, clubbing, or edema Peripheral Pulses: 2+ radial bilaterally Skin: Warm, dry, no diaphoresis  Assessment   89 y.o. female with  1. Paroxysmal atrial fibrillation (CMS/HHS-HCC)   2. Need for vaccination   3. Essential hypertension   4. SOB (shortness of breath) on exertion    89 year old female with essential hypertension, blood pressure well-controlled on current BP medications.  Stress echocardiogram 07/31/2018 revealed normal left ventricular function, with normal wall motion, without evidence of ischemia.   Patient reported intermittent episodes of heart racing which occurs on most days.  72-hour Holter monitor revealed paroxysmal atrial fibrillation and frequent premature atrial contractions.  The patient experienced bradycardia, which resolved after discontinuing metoprolol  succinate.  Repeat 7-day Holter monitor 03/22/2023 - 03/29/2023 revealed predominant sinus rhythm with mean heart rate of 83 bpm, AF burden 17%, and frequent premature atrial contractions.  Patient has CHA2DS2-VASc score of 4 on Eliquis  for stroke prevention. At the last visit, ECG revealed atrial fibrillation 166 bpm. She was started on diltiazem  60 mg BID at that visit. She had an episode of atrial fibrillation with RVR since this change, went to [facility] ER, received oral and IV diltiazem , and rates improved. Since then, heart rate and blood pressure had been well controlled, but she has peripheral edema, possibly secondary to diltiazem . She is concerned about taking any medication that may make her tired or swell. Heart rate elevated today, though she has not taken her morning dose of diltiazem .   Plan   1.  Reduce dose of diltiazem  to 30 mg due to  swelling and heavy feeling in head. Advised patient that she can take an additional half tablet (30 mg) for persistent heart racing 2.  Continue low-sodium diet 3.  Continue Eliquis  for stroke prevention 4.  Recommend compression stockings 5.  Advised patient to keep blood pressure and heart rate diary 6.  Return to clinic for follow up in 1 week  Orders Placed This Encounter  Procedures  . ECG 12-lead    Return in about 1 week (around 09/24/2024) for with Dr. Ammon.  Attestation Statement:   I personally performed the service, non-incident to. (WP)   ANNA MARIA DRANE, PA-C

## 2024-09-25 NOTE — Progress Notes (Signed)
 Established Patient Visit   Chief Complaint: Chief Complaint  Patient presents with  . Follow-up    1 week   Date of Service: 09/25/2024 Date of Birth: 1935-04-05 PCP: Bertrum Charlie Raring, MD  History of Present Illness: Ms. Muratalla is a 89 y.o.female patient returns for   1.  Paroxysmal atrial fibrillation  2.  Essential hypertension  3.  Sinus bradycardia on metoprolol  succinate  At the initial office visit on 07/18/2018, the patient reported a 1 to 72-month history of new onset exertional dyspnea.  She had also experienced an episode of chest pain, described as substernal chest tightness with exertion, relieved with rest.  She also reported occasional palpitations.  The patient had an office visit with her primary care provider on 07/16/2018, which time her blood pressure was noted to be elevated at 160/60.  Hydralazine  was added to her losartan  at that visit.  Her blood pressures have overall improved.  The patient underwent a stress echocardiogram on 07/31/2018.  She exercised for 4:31 minutes without chest pain or ECG changes.  At baseline, 2D echocardiogram revealed normal left ventricular function, with LVEF greater than 55%.  At peak exercise there was appropriate augmentation of all myocardial segments, without evidence of ischemia.  The patient was seen 01/31/2022 at which time she reported occasional palpitations, and episodes of heart racing sometimes occurring while walking after eating.    72-hour Holter monitor 01/31/2022 - 02/03/2022 revealed predominant sinus rhythm with mean heart rate of 71 bpm, paroxysmal atrial fibrillation with AF burden 5.4% and longest episode 1 hour and 41 minutes with frequent premature atrial contractions.  The patient recently experienced a bradycardia with lightheadedness and dizziness, which resolved after discontinuing metoprolol  succinate.  In 09/09/2024, patient was noted to be in atrial fibrillation with a rapid ventricular rate, started on Cardizem  60 mg  twice daily.  She was seen in [facility] ED, peripheral edema, at which time the patient was in sinus rhythm, and diltiazem  dose was decreased to 30 mg twice daily.  The patient returns today for follow-up, reports doing better.  She denies exertional chest pain or shortness of breath.  She reports occasional palpitations.  She has persistent peripheral edema.  The patient is active, walks 3 miles 3-4 times weekly, goes to the fitness center at [employer] twice weekly, exercises with a trainer.   Patient is completely asymptomatic.  Heart rate today 87 and regular.  7-day Holter monitor 03/22/2023 - 03/29/2023 revealed predominant sinus rhythm with mean heart rate of 83 bpm, sinus heart rate range 37 to 109 bpm, paroxysmal atrial fibrillation with AF burden 17%, longest episode 3 hours and 22 minutes, frequent premature atrial contractions, occasional premature ventricular contractions.  The patient has paroxysmal atrial fibrillation as noted on 72-hour Holter monitor with CHA2DS2-VASc score of 4, started on Eliquis  during her last office visit, which is tolerated well without apparent side effects.  Patient unable to tolerate metoprolol  succinate which caused bradycardia with fatigue.  The patient has essential hypertension, blood pressure normal, currently on losartan  and low-dose diltiazem , which is well tolerated without apparent side effects.  Blood pressure diary reveals systolic blood pressures 96-1 34, diastolic blood pressures 53-62, heart rate 59-1 24 bpm.  The patient tries to follow a low-sodium, no added salt diet.  Past Medical and Surgical History  Past Medical History Past Medical History:  Diagnosis Date  . Arthritis   . Endometrial polyp   . Gastroesophageal reflux disease without esophagitis 01/22/2019  . GERD (gastroesophageal  reflux disease)   . HH (hiatus hernia) 01/22/2019  . Hypertension   . Sensation of lump in throat 01/22/2019  . Vaginal prolapse     Past Surgical  History She has a past surgical history that includes removal adhesion at lower small intestine; thyroglossal duct cyst removal; Endoscopic Carpal Tunnel Release; Breast surgery; egd (04/23/2007); Colonoscopy (03/03/2002); Colonoscopy (04/23/2007); egd (07/22/2017); and Colonoscopy (07/22/2017).   Medications and Allergies  Current Medications  Current Outpatient Medications  Medication Sig Dispense Refill  . acetaminophen  (TYLENOL  8 HOUR ORAL) Take by mouth as needed    . calcium carbonate-vitamin D3 (OS-CAL 500+D) 500 mg(1,250mg ) -200 unit tablet Take 1 tablet by mouth 2 (two) times daily with meals.    . dicyclomine  (BENTYL ) 20 mg tablet Take 1 tablet (20 mg total) by mouth 4 (four) times daily 30 tablet 3  . dilTIAZem  (CARDIZEM ) 60 MG immediate release tablet Take 1 tablet (60 mg total) by mouth 2 (two) times daily at lunch and dinner 60 tablet 11  . ELIQUIS  2.5 mg tablet TAKE 1 TABLET BY MOUTH EVERY 12 HOURS. 60 tablet 6  . losartan  (COZAAR ) 50 MG tablet TAKE 1 TABLET BY MOUTH EVERY DAY 90 tablet 3  . pantoprazole  (PROTONIX ) 40 MG DR tablet TAKE 1 TABLET BY MOUTH EVERY DAY 90 tablet 1  . polyethylene glycol (MIRALAX) packet Take 17 g by mouth once daily. Mix in 4-8ounces of fluid prior to taking.    SABRA PROLENSA 0.07 % ophthalmic solution Place 1 drop into the left eye 2 (two) times daily    . promethazine  (PHENERGAN ) 25 MG tablet TAKE 1 TABLET (25 MG TOTAL) BY MOUTH ONCE DAILY AS NEEDED FOR NAUSEA 60 tablet 2   No current facility-administered medications for this visit.    Allergies: Loratadine, Other, Amoxicillin, Oxaprozin, and Sulfa (sulfonamide antibiotics)  Social and Family History  Social History  reports that she has never smoked. She has never used smokeless tobacco. She reports current alcohol  use of about 2.0 standard drinks of alcohol  per week. She reports that she does not use drugs.  Family History Family History  Problem Relation Name Age of Onset  . High blood  pressure (Hypertension) Mother    . Kidney failure Mother    . High blood pressure (Hypertension) Father    . High blood pressure (Hypertension) Brother    . Heart failure Brother    . High blood pressure (Hypertension) Brother    . Leukemia Brother      Review of Systems   Review of Systems: The patient denies chest pain, with mild chronic exertional shortness of breath, without orthopnea, paroxysmal nocturnal dyspnea,pedal edema, with occasional palpitations, without prolonged heart racing, presyncope, syncope, with anxiety, with occasional throat tightness associated with anxiety. Review of 10 Systems is negative except as described above.  Physical Examination   Vitals:BP 122/76   Pulse 87   Ht 165.1 cm (5' 5)   Wt 58.7 kg (129 lb 6.4 oz)   SpO2 99%   BMI 21.53 kg/m  Ht:165.1 cm (5' 5) Wt:58.7 kg (129 lb 6.4 oz) ADJ:Anib surface area is 1.64 meters squared. Body mass index is 21.53 kg/m.  General: Alert and oriented.  Anxious-appearing. No acute distress.  Appears younger than stated age. HEENT: Pupils equally reactive to light and accomodation    Neck: Supple, no JVD Lungs: Normal effort of breathing; clear to auscultation bilaterally; no wheezes, rales, rhonchi Heart: Regular rate and rhythm. No murmur, rub, or gallop Abdomen: nondistended,  bowel sounds present Extremities: no cyanosis, clubbing, or edema Peripheral Pulses: 2+ radial bilaterally Skin: Warm, dry, no diaphoresis  Assessment   89 y.o. female with  1. Chest pain with low risk for cardiac etiology   2. Essential hypertension   3. Heart palpitations   4. Paroxysmal atrial fibrillation (CMS/HHS-HCC)   5. SOB (shortness of breath) on exertion    89 year old female with essential hypertension, blood pressure well-controlled on current BP medications.  Stress echocardiogram 07/31/2018 revealed normal left ventricular function, with normal wall motion, without evidence of ischemia.   Patient reports  intermittent episodes of heart racing which occurs on most days.  72-hour Holter monitor revealed paroxysmal atrial fibrillation and frequent premature atrial contractions.  The patient experienced bradycardia, which resolved after discontinuing metoprolol  succinate.  Repeat 7-day Holter monitor 03/22/2023 - 03/29/2023 revealed predominant sinus rhythm with mean heart rate of 83 bpm, AF burden 17%, and frequent premature atrial contractions.  Patient has CHA2DS2-VASc score of 4 on Eliquis  for stroke prevention.  ECG in/07/2024 revealed atrial fibrillation 166 bpm.  Patient completely asymptomatic.  Patient started on low-dose diltiazem  60 mg twice daily which was decreased to 30 mg twice daily due to peripheral edema.  Plan   1.  Continue current medications 2.  Counseled patient about low-sodium diet 3.  DASH diet printed instructions given to the patient 4.  Continue Eliquis  for stroke prevention 5.  Continue diltiazem  30 mg twice daily 6.  Return to clinic for follow-up in 2 months  No orders of the defined types were placed in this encounter.   Return in about 2 months (around 11/25/2024).  MARSA DOOMS, MD PhD [facility]

## 2024-10-16 NOTE — Progress Notes (Signed)
 [facility] - [facility]  Chief complaint: Follow-up   Subjective: Bianca Malone is a 89 y.o. female here for f/u. History of Present Illness Bianca Malone is an 89 year old female with atrial fibrillation who presents with lightheadedness and medication side effects.  She experiences very mild  lightheadedness and swelling in her legs after starting diltiazem  at 60 mg. The swelling was significant enough to prevent her from wearing shoes. She adjusted the dose to 30 mg twice a day, which improved her symptoms, reducing the swelling and allowing her to regain her ankles.  She continues to have occasional lightheadedness and shortness of breath, though the latter has improved. The lightheadedness is not constant and sometimes occurs with activity but resolves spontaneously. She is currently taking losartan  at 50 mg, previously reduced from 100 mg.  She tries to stay hydrated and drinks more water than before, though she sometimes wakes up three to four times a night to urinate, affecting her sleep. She has recently received her flu shot and shingles vaccine.    Current Outpatient Medications:  .  acetaminophen  (TYLENOL  8 HOUR ORAL), Take by mouth as needed, Disp: , Rfl:  .  calcium carbonate-vitamin D3 (OS-CAL 500+D) 500 mg(1,250mg ) -200 unit tablet, Take 1 tablet by mouth 2 (two) times daily with meals., Disp: , Rfl:  .  dicyclomine  (BENTYL ) 20 mg tablet, Take 1 tablet (20 mg total) by mouth 4 (four) times daily, Disp: 30 tablet, Rfl: 3 .  dilTIAZem  (CARDIZEM ) 60 MG immediate release tablet, Take 1 tablet (60 mg total) by mouth 2 (two) times daily at lunch and dinner, Disp: 60 tablet, Rfl: 11 .  ELIQUIS  2.5 mg tablet, TAKE 1 TABLET BY MOUTH EVERY 12 HOURS., Disp: 60 tablet, Rfl: 6 .  losartan  (COZAAR ) 50 MG tablet, TAKE 1 TABLET BY MOUTH EVERY DAY, Disp: 90 tablet, Rfl: 3 .  pantoprazole  (PROTONIX ) 40 MG DR tablet, TAKE 1 TABLET BY MOUTH EVERY DAY, Disp: 90 tablet, Rfl: 1 .  polyethylene glycol  (MIRALAX) packet, Take 17 g by mouth once daily. Mix in 4-8ounces of fluid prior to taking., Disp: , Rfl:  .  PROLENSA 0.07 % ophthalmic solution, Place 1 drop into the left eye 2 (two) times daily, Disp: , Rfl:  .  promethazine  (PHENERGAN ) 25 MG tablet, TAKE 1 TABLET (25 MG TOTAL) BY MOUTH ONCE DAILY AS NEEDED FOR NAUSEA, Disp: 60 tablet, Rfl: 2  ROS reviewed: General ROS:  Negative for  - fatigue, fevers, chills HEENT ROS: negative for seasonal allergies, congestion, cough Respiratory ROS: negative for - cough, SOB, wheezing Cardiovascular ROS: no chest pain or dyspnea on exertion Gastrointestinal ROS: negative for constipation, N/V,D Genito-Urinary ROS: no dysuria, trouble voiding, or hematuria Musculoskeletal ROS: negative for joint pain, swelling Neurological ROS: negative for headaches, dizzinesss Dermatological ROS: negative for rash or skin lesion    Psychological ROS: negative for depression or anxiety  Allergies  Allergen Reactions  . Loratadine Rash  . Other Rash    Ozpro-Daypro  . Amoxicillin Unknown, Hives and Rash  . Oxaprozin Rash  . Sulfa (Sulfonamide Antibiotics) Rash    presuptive reaction    Social History   Tobacco Use  . Smoking status: Never  . Smokeless tobacco: Never  Vaping Use  . Vaping status: Never Used  Substance Use Topics  . Alcohol  use: Yes    Alcohol /week: 2.0 standard drinks of alcohol     Types: 2 Glasses of wine per week    Comment: occasional glass of wine  .  Drug use: Never      Objective:  BP 102/62 (BP Location: Left upper arm, Patient Position: Sitting, BP Cuff Size: Adult)   Pulse 65   Resp 16   Ht 165.1 cm (5' 5)   Wt 58.5 kg (129 lb)   SpO2 98%   BMI 21.47 kg/m  reviewed. Gen: AAOx3. Well-developed and well-nourished. NAD.  HEENT:    HEAD NORMOCEPHALIC.  PERRLA, EOM intact.  No thyromegaly present. No lymphadpathy. Cardiovascular: Normal rate, regular rhythm. Normal S1 and S2 without murmus, rubs or gallops.   Pulmonary/Chest: CTAB. Effort normal and breath sounds normal. No respiratory distress. No wheezes or rales. Abdomen: Soft. Bowel sounds are normal. No distension or tenderness.  Neuro: Cranial nervess II-XII intact.Nonfocal exam. Skin: Skin is warm and dry.  Musculoskeletal: Normal range of motion. No edema, no tenderness.  Psychiatric: Normal mood and affect. Her behavior is normal. Judgment and thought content normal  Assessment and Plan: Assessment & Plan Atrial fibrillation Managed with diltiazem  for rate control. Symptoms improved with dose reduction.  Hypertension Blood pressure borderline low, contributing to lightheadedness. Losartan  used for control and stroke risk reduction. - If lightheadedness persists, reduce losartan  to half a pill daily. - Ensure adequate hydration to manage blood pressure and prevent lightheadedness.  Nocturia Occasional nocturia, possibly related to fluid intake. - Limit fluid intake in the evening to reduce nocturia.  General Health Maintenance Received flu shot and shingles vaccine. Discussed hydration importance. - Ensure adequate hydration throughout the day.   Diagnoses and all orders for this visit:  Need for influenza vaccination -     FLU VACCINE MDCK IIV3 (EGG FREE), IM PF, (81MO+)(FLUCELVAX)   Essential hypertension  (primary encounter diagnosis)  Need for influenza vaccination Plan: FLU VACCINE MDCK IIV3 (EGG FREE), IM PF,        (81MO+)(FLUCELVAX)  Paroxysmal atrial fibrillation (CMS/HHS-HCC)  Anxiety  Light headedness        This note has been created using automated tools and reviewed for accuracy by RICHARD LESLIE GILBERT.

## 2024-10-27 ENCOUNTER — Other Ambulatory Visit: Payer: Self-pay

## 2024-10-27 ENCOUNTER — Emergency Department: Admission: EM | Admit: 2024-10-27 | Discharge: 2024-10-27 | Disposition: A | Discharge status: Home / Self Care

## 2024-10-27 DIAGNOSIS — I4891 Unspecified atrial fibrillation: Secondary | ICD-10-CM | POA: Diagnosis present

## 2024-10-27 DIAGNOSIS — Z79899 Other long term (current) drug therapy: Secondary | ICD-10-CM | POA: Diagnosis not present

## 2024-10-27 NOTE — ED Triage Notes (Addendum)
 Pt to ED via [facility] from home. Pt called EMS due to dizziness and nausea. On EMS arrival, pt hr 180. Pt has hx of Afib, put on 60 mg Cardizem . Pt non compliant with medication due to pill making her nauseous. Pt given 12.5 mg Cardizem  by EMS, rate improved to 120-140 bpm.  Pt has hx Afib, HTN.  BP 158/83

## 2024-10-27 NOTE — ED Notes (Signed)
 Pt discharged at this time. RN reviewed discharge instructions with pt. Pt verbalized understanding. Vital signs taken. RR even and unlabored. Pt denies any questions or needs at this time. Pt ambulatory at discharge.

## 2024-10-27 NOTE — ED Provider Notes (Signed)
 [facility] Provider Note    Event Date/Time   First MD Initiated Contact with Patient 10/27/24 1530     (approximate)   History   Atrial Fibrillation   HPI  Bianca Malone is a 89 y.o. female who presents with concern of rapid heart rate.  She has History of atrial fibrillation she is supposed to be on diltiazem  for this, unfortunately diltiazem  makes her feel poorly, she was initially placed on 60 mg of this, she called her cardiologist and was recently switched to 30 mg twice daily.  She continues feeling symptoms so she called cardiology again and yesterday was told to discontinue her diltiazem .  Today she started having lightheadedness feeling off balance and heart racing sensation.  Called EMS, she was given 12-1/2 mg of diltiazem  and route.  While chatting, her symptoms improved and she feels back to baseline now.  I did review her recent cardiology notes, concern of exertional dyspnea on and off, she did have an echocardiogram done that was reviewed in the cardiology note.  I also reviewed her recent PCP visit where she was complaining of what she felt like side effects from her medications.      Physical Exam   Triage Vital Signs: ED Triage Vitals  Encounter Vitals Group     BP      Girls Systolic BP Percentile      Girls Diastolic BP Percentile      Boys Systolic BP Percentile      Boys Diastolic BP Percentile      Pulse      Resp      Temp      Temp src      SpO2      Weight      Height      Head Circumference      Peak Flow      Pain Score      Pain Loc      Pain Education      Exclude from Growth Chart     Most recent vital signs: Vitals:   10/27/24 1554  BP: (!) 159/97  Pulse: 89  Resp: (!) 21  Temp: 97.7 F (36.5 C)  SpO2: 98%     General: Awake, no distress.  CV:  Good peripheral perfusion.  No lower extremity swelling Resp:  Normal effort.  Able to speak in full sentences Abd:  No distention.  Soft nontender  nondistended Other:     ED Results / Procedures / Treatments   Labs (all labs ordered are listed, but only abnormal results are displayed) Labs Reviewed - No data to display   EKG  Irregularly irregular rhythm with rate of about 125, axis of 90, intervals appear to be within normal limits, no obvious ischemia.   RADIOLOGY   PROCEDURES:  Critical Care performed: No  Procedures   MEDICATIONS ORDERED IN ED: Medications - No data to display   IMPRESSION / MDM / ASSESSMENT AND PLAN / ED COURSE  I reviewed the triage vital signs and the nursing notes.                               Patient's presentation is most consistent with acute complicated illness / injury requiring diagnostic workup.  89 year old female who has a underlying history of paroxysmal atrial fibrillation.  She recently stopped taking her diltiazem  due to concerns of the side effects associated with  this.  She presents with A-fib RVR she was given about 12-1/2 mg of diltiazem  and route by EMS.  I was monitoring her telemetry and during conversation she converted spontaneously and her symptoms significantly improved.  She feels back to baseline now and much improved.  We had not gotten any blood work but given the presentation I suspect likely secondary to having missed doses of her diltiazem .  Given the severity of the side effects that she is experiencing, I am going to reach out to cardiology to discuss changing in her medications with anticipated discharge home.   Clinical Course as of 10/27/24 1638  Tue Oct 27, 2024  1629 Repeat EKG shows a sinus rhythm with a rate of 80, axis of 80, intervals appear to be within normal limits, no obvious ischemia. [SK]  1634 Discussed the patient's presentation with cardiology, Dr. Dewane, at this time recommends cutting the home dose of diltiazem  in half and taking once a day with plan for outpatient cardiology follow-up.  Given the patient's symptoms at this time we will  have her discharged home, will ensure patient is able to ambulate and tolerate p.o. properly and feels back to baseline prior to discharge. [SK]    Clinical Course User Index [SK] Fernand Rossie HERO, MD     FINAL CLINICAL IMPRESSION(S) / ED DIAGNOSES   Final diagnoses:  Atrial fibrillation with RVR (HCC)     Rx / DC Orders   ED Discharge Orders          Ordered    Ambulatory referral to Cardiology       Comments: If you have not heard from the Cardiology office within the next 72 hours please call [phone].   10/27/24 1637             Note:  This document was prepared using Dragon voice recognition software and may include unintentional dictation errors.   Fernand Rossie HERO, MD 10/27/24 [place]

## 2024-10-27 NOTE — Discharge Instructions (Addendum)
 You were seen today due to concern of a rapid heart rate.  At this time fortunately it has improved with the medication that you were given by the EMS team.  I have written for the same medication for you to take at home, please take this as instructed and stop taking your other diltiazem  medication.  If you have any worsening of symptoms please return to the emergency department immediately for further medical management.  Otherwise you should follow-up with the cardiologist within the next 3 to 4 days for reevaluation of symptoms.

## 2024-10-28 ENCOUNTER — Observation Stay: Admit: 2024-10-28

## 2024-10-28 ENCOUNTER — Observation Stay
Admission: EM | Admit: 2024-10-28 | Discharge: 2024-10-30 | Disposition: A | Discharge status: Home / Self Care | Attending: Emergency Medicine | Admitting: Emergency Medicine

## 2024-10-28 ENCOUNTER — Observation Stay
Admit: 2024-10-28 | Discharge: 2024-10-28 | Disposition: A | Discharge status: Home / Self Care | Attending: Student | Admitting: Student

## 2024-10-28 ENCOUNTER — Emergency Department

## 2024-10-28 DIAGNOSIS — I10 Essential (primary) hypertension: Secondary | ICD-10-CM | POA: Insufficient documentation

## 2024-10-28 DIAGNOSIS — K219 Gastro-esophageal reflux disease without esophagitis: Secondary | ICD-10-CM | POA: Diagnosis not present

## 2024-10-28 DIAGNOSIS — I4891 Unspecified atrial fibrillation: Principal | ICD-10-CM | POA: Insufficient documentation

## 2024-10-28 DIAGNOSIS — Z7901 Long term (current) use of anticoagulants: Secondary | ICD-10-CM | POA: Insufficient documentation

## 2024-10-28 DIAGNOSIS — F1090 Alcohol use, unspecified, uncomplicated: Secondary | ICD-10-CM | POA: Insufficient documentation

## 2024-10-28 DIAGNOSIS — R42 Dizziness and giddiness: Secondary | ICD-10-CM | POA: Diagnosis present

## 2024-10-28 LAB — BASIC METABOLIC PANEL WITH GFR
Anion gap: 12 (ref 5–15)
BUN: 22 mg/dL (ref 8–23)
CO2: 22 mmol/L (ref 22–32)
Calcium: 9.3 mg/dL (ref 8.9–10.3)
Chloride: 102 mmol/L (ref 98–111)
Creatinine, Ser: 0.81 mg/dL (ref 0.44–1.00)
GFR, Estimated: >60 mL/min (ref >60)
Glucose, Bld: 108 mg/dL — ABNORMAL HIGH (ref 70–99)
Potassium: 3.5 mmol/L (ref 3.5–5.1)
Sodium: 136 mmol/L (ref 135–145)

## 2024-10-28 LAB — CBC WITH DIFFERENTIAL/PLATELET
Abs Immature Granulocytes: 0.02 K/uL (ref 0.00–0.07)
Basophils Absolute: 0.1 K/uL (ref 0.0–0.1)
Basophils Relative: 1 %
Eosinophils Absolute: 0.1 K/uL (ref 0.0–0.5)
Eosinophils Relative: 2 %
HCT: 40.9 % (ref 36.0–46.0)
Hemoglobin: 14.3 g/dL (ref 12.0–15.0)
Immature Granulocytes: 0 %
Lymphocytes Relative: 19 %
Lymphs Abs: 1 K/uL (ref 0.7–4.0)
MCH: 30.8 pg (ref 26.0–34.0)
MCHC: 35 g/dL (ref 30.0–36.0)
MCV: 88 fL (ref 80.0–100.0)
Monocytes Absolute: 0.5 K/uL (ref 0.1–1.0)
Monocytes Relative: 9 %
Neutro Abs: 3.6 K/uL (ref 1.7–7.7)
Neutrophils Relative %: 69 %
Platelets: 214 K/uL (ref 150–400)
RBC: 4.65 MIL/uL (ref 3.87–5.11)
RDW: 12.5 % (ref 11.5–15.5)
WBC: 5.2 K/uL (ref 4.0–10.5)
nRBC: 0 % (ref 0.0–0.2)

## 2024-10-28 LAB — TROPONIN T, HIGH SENSITIVITY
Troponin T High Sensitivity: <15 ng/L (ref 0–19)
Troponin T High Sensitivity: <15 ng/L (ref 0–19)

## 2024-10-28 LAB — MAGNESIUM: Magnesium: 1.9 mg/dL (ref 1.7–2.4)

## 2024-10-28 LAB — T4, FREE: Free T4: 1.27 ng/dL — ABNORMAL HIGH (ref 0.61–1.12)

## 2024-10-28 LAB — TSH: TSH: 2.54 u[IU]/mL (ref 0.350–4.500)

## 2024-10-28 MED ORDER — ONDANSETRON HCL 4 MG/2ML IJ SOLN
4.0000 mg | Freq: Once | INTRAMUSCULAR | Status: AC
  Administered 2024-10-28: 4 mg via INTRAVENOUS
  Filled 2024-10-28: qty 2

## 2024-10-28 MED ORDER — TRAZODONE HCL 50 MG PO TABS: 25.0000 mg | ORAL_TABLET | Freq: Every evening | ORAL | Status: DC | PRN

## 2024-10-28 MED ORDER — PANTOPRAZOLE SODIUM 40 MG PO TBEC
40.0000 mg | DELAYED_RELEASE_TABLET | Freq: Every day | ORAL | Status: DC
  Administered 2024-10-28 – 2024-10-30 (×3): 40 mg via ORAL
  Filled 2024-10-28 (×3): qty 1

## 2024-10-28 MED ORDER — AMIODARONE HCL IN DEXTROSE 360-4.14 MG/200ML-% IV SOLN
60.0000 mg/h | INTRAVENOUS | Status: DC
  Administered 2024-10-28: 60 mg/h via INTRAVENOUS
  Filled 2024-10-28: qty 200

## 2024-10-28 MED ORDER — ACETAMINOPHEN 325 MG PO TABS
650.0000 mg | ORAL_TABLET | Freq: Four times a day (QID) | ORAL | Status: DC | PRN
Start: 2024-10-28 — End: 2024-10-30

## 2024-10-28 MED ORDER — AMIODARONE HCL 200 MG PO TABS: 200.0000 mg | ORAL_TABLET | Freq: Every day | ORAL | Status: DC

## 2024-10-28 MED ORDER — AMIODARONE LOAD VIA INFUSION
150.0000 mg | Freq: Once | INTRAVENOUS | Status: AC
  Administered 2024-10-28: 150 mg via INTRAVENOUS
  Filled 2024-10-28: qty 83.34

## 2024-10-28 MED ORDER — AMIODARONE HCL 200 MG PO TABS
200.0000 mg | ORAL_TABLET | Freq: Two times a day (BID) | ORAL | Status: DC
  Administered 2024-10-28 – 2024-10-30 (×4): 200 mg via ORAL
  Filled 2024-10-28 (×4): qty 1

## 2024-10-28 MED ORDER — AMIODARONE HCL IN DEXTROSE 360-4.14 MG/200ML-% IV SOLN: 30.0000 mg/h | INTRAVENOUS | Status: DC

## 2024-10-28 MED ORDER — APIXABAN 5 MG PO TABS
5.0000 mg | ORAL_TABLET | Freq: Two times a day (BID) | ORAL | Status: DC
  Administered 2024-10-28 – 2024-10-29 (×3): 5 mg via ORAL
  Administered 2024-10-30: 2.5 mg via ORAL
  Filled 2024-10-28 (×4): qty 1

## 2024-10-28 MED ORDER — ACETAMINOPHEN 650 MG RE SUPP
650.0000 mg | Freq: Four times a day (QID) | RECTAL | Status: DC | PRN
Start: 2024-10-28 — End: 2024-10-30

## 2024-10-28 MED ORDER — LOSARTAN POTASSIUM 50 MG PO TABS
50.0000 mg | ORAL_TABLET | Freq: Every day | ORAL | Status: DC
  Administered 2024-10-28 – 2024-10-30 (×3): 50 mg via ORAL
  Filled 2024-10-28 (×3): qty 1

## 2024-10-28 MED ORDER — DILTIAZEM HCL-DEXTROSE 125-5 MG/125ML-% IV SOLN (PREMIX)
5.0000 mg/h | INTRAVENOUS | Status: DC
  Administered 2024-10-28: 5 mg/h via INTRAVENOUS
  Filled 2024-10-28: qty 125

## 2024-10-28 MED ORDER — SODIUM CHLORIDE 0.9 % IV BOLUS (SEPSIS): 1000.0000 mL | Freq: Once | INTRAVENOUS | Status: DC

## 2024-10-28 MED ORDER — ONDANSETRON HCL 4 MG PO TABS
4.0000 mg | ORAL_TABLET | Freq: Four times a day (QID) | ORAL | Status: DC | PRN
Start: 2024-10-28 — End: 2024-10-30

## 2024-10-28 MED ORDER — MECLIZINE HCL 25 MG PO TABS
25.0000 mg | ORAL_TABLET | Freq: Once | ORAL | Status: AC
  Administered 2024-10-28: 25 mg via ORAL
  Filled 2024-10-28: qty 1

## 2024-10-28 MED ORDER — ONDANSETRON HCL 4 MG/2ML IJ SOLN: 4.0000 mg | Freq: Four times a day (QID) | INTRAMUSCULAR | Status: DC | PRN

## 2024-10-28 MED ORDER — ALBUTEROL SULFATE (2.5 MG/3ML) 0.083% IN NEBU: 2.5000 mg | INHALATION_SOLUTION | RESPIRATORY_TRACT | Status: DC | PRN

## 2024-10-28 NOTE — ED Provider Notes (Signed)
 [facility] Provider Note    Event Date/Time   First MD Initiated Contact with Patient 10/28/24 0522     (approximate)   History   Shortness of Breath   HPI  Bianca Malone is a 89 y.o. female with history of atrial fibrillation on Eliquis , hypertension who presents to the emergency department with shortness of breath, palpitations that started tonight.  Was seen here earlier yesterday for the same and was in A-fib with RVR that converted spontaneously with diltiazem  with EMS.  She had admitted at that time that she had missed several doses of her medications.  No lab work was obtained due to difficulty obtaining an IV and patient then being asymptomatic.  Patient now reports she is having vertigo, nausea without vomiting, shortness of breath but denies any chest pain.  Feels she feels like her heart is racing.  Heart rate in the 130s to 160s with EMS and improved to the 80s to 120s after 10 mg of IV diltiazem .  Cardiologist is Dr. Ammon.   History provided by patient, EMS.    Past Medical History:  Diagnosis Date   Actinic keratosis    Arthritis    Atypical mole 08/31/2008   L suprapubic   COVID-19 01/2020   GERD (gastroesophageal reflux disease)    Hypertension    Vaginal prolapse     Past Surgical History:  Procedure Laterality Date   ABDOMINAL ADHESION SURGERY     small bowel lysis of adhesions   BREAST CYST ASPIRATION Left    CARPAL TUNNEL RELEASE     CATARACT EXTRACTION W/PHACO Left 03/14/2021   Procedure: CATARACT EXTRACTION PHACO AND INTRAOCULAR LENS PLACEMENT (IOC) LEFT 9.46 01:01.2;  Surgeon: Jaye Fallow, MD;  Location: [facility];  Service: Ophthalmology;  Laterality: Left;   COLONOSCOPY     COLONOSCOPY WITH PROPOFOL  N/A 07/22/2017   Procedure: COLONOSCOPY WITH PROPOFOL ;  Surgeon: Viktoria Lamar DASEN, MD;  Location: [facility] ENDOSCOPY;  Service: Endoscopy;  Laterality: N/A;   CYST EXCISION     thyroglossal duct cyst  surgery   ESOPHAGOGASTRODUODENOSCOPY (EGD) WITH PROPOFOL  N/A 07/22/2017   Procedure: ESOPHAGOGASTRODUODENOSCOPY (EGD) WITH PROPOFOL ;  Surgeon: Viktoria Lamar DASEN, MD;  Location: [facility] ENDOSCOPY;  Service: Endoscopy;  Laterality: N/A;   HYSTEROSCOPY     INCISION AND DRAINAGE / EXCISION THYROGLOSSAL CYST     UPPER GASTROINTESTINAL ENDOSCOPY      MEDICATIONS:  Prior to Admission medications   Medication Sig Start Date End Date Taking? Authorizing Provider  Acetaminophen  (TYLENOL  8 HOUR PO) Take by mouth.    [provider]  CALCIUM CARBONATE-VITAMIN D PO Take 2 tablets by mouth daily. Reported on 03/08/2016    [provider]  clidinium-chlordiazePOXIDE  (LIBRAX) 5-2.5 MG capsule Take 1 capsule by mouth 2 (two) times daily as needed. 04/07/20   Bertrum Charlie CROME, MD  dicyclomine  (BENTYL ) 20 MG tablet Take 1 tablet (20 mg total) by mouth every 6 (six) hours as needed for spasms. 09/26/21   Bertrum Charlie CROME, MD  ELIQUIS  5 MG TABS tablet Take 5 mg by mouth 2 (two) times daily. 03/15/22   [provider]  losartan  (COZAAR ) 100 MG tablet TAKE 1 TABLET BY MOUTH EVERY DAY 02/11/22   Bertrum Charlie CROME, MD  pantoprazole  (PROTONIX ) 40 MG tablet Take 1 tablet (40 mg total) by mouth daily. 10/12/22   Cyndi Shaver, PA-C  polyethylene glycol (MIRALAX / GLYCOLAX) packet Take 17 g by mouth daily as needed.    [provider]  promethazine  (PHENERGAN ) 25 MG tablet Take 1 tablet (25 mg total) by mouth every 6 (six) hours as needed for nausea or vomiting. 09/26/21   Bertrum Charlie CROME, MD    Physical Exam   Triage Vital Signs: ED Triage Vitals  Encounter Vitals Group     BP 10/28/24 0536 (!) 148/71     Girls Systolic BP Percentile --      Girls Diastolic BP Percentile --      Boys Systolic BP Percentile --      Boys Diastolic BP Percentile --      Pulse Rate 10/28/24 0536 90     Resp 10/28/24 0536 19     Temp 10/28/24 0536 97.7 F (36.5 C)     Temp Source 10/28/24 0536  Oral     SpO2 10/28/24 0536 97 %     Weight --      Height --      Head Circumference --      Peak Flow --      Pain Score 10/28/24 0532 0     Pain Loc --      Pain Education --      Exclude from Growth Chart --      Most recent vital signs: Vitals:   10/28/24 0536  BP: (!) 148/71  Pulse: 90  Resp: 19  Temp: 97.7 F (36.5 C)  SpO2: 97%    CONSTITUTIONAL: Alert, responds appropriately to questions.  Elderly HEAD: Normocephalic, atraumatic EYES: Conjunctivae clear, pupils appear equal, sclera nonicteric ENT: normal nose; moist mucous membranes NECK: Supple, normal ROM CARD: Irregularly irregular and tachycardic; S1 and S2 appreciated RESP: Normal chest excursion without splinting or tachypnea; breath sounds clear and equal bilaterally; no wheezes, no rhonchi, no rales, no hypoxia or respiratory distress, speaking full sentences ABD/GI: Non-distended; soft, non-tender, no rebound, no guarding, no peritoneal signs BACK: The back appears normal EXT: Normal ROM in all joints; no deformity noted, no edema, no calf tenderness or calf swelling SKIN: Normal color for age and race; warm; no rash on exposed skin NEURO: Moves all extremities equally, normal speech, normal sensation diffusely, cranial nerves II to XII intact PSYCH: The patient's mood and manner are appropriate.   ED Results / Procedures / Treatments   LABS: (all labs ordered are listed, but only abnormal results are displayed) Labs Reviewed  BASIC METABOLIC PANEL WITH GFR - Abnormal; Notable for the following components:      Result Value   Glucose, Bld 108 (*)    All other components within normal limits  T4, FREE - Abnormal; Notable for the following components:   Free T4 1.27 (*)    All other components within normal limits  CBC WITH DIFFERENTIAL/PLATELET  MAGNESIUM  TSH  TROPONIN T, HIGH SENSITIVITY  TROPONIN T, HIGH SENSITIVITY     EKG:  EKG Interpretation Date/Time:  Wednesday October 28 2024  05:37:40 EST Ventricular Rate:  105 PR Interval:    QRS Duration:  93 QT Interval:  363 QTC Calculation: 480 R Axis:   84  Text Interpretation: Atrial fibrillation Borderline right axis deviation Consider left ventricular hypertrophy Confirmed by Neomi Neptune [phone]) on 10/28/2024 5:41:27 AM         RADIOLOGY: My personal review and interpretation of imaging: Chest x-ray clear.  I have personally reviewed all radiology reports.   DG Chest Portable 1 View Result Date: 10/28/2024 EXAM: 1 VIEW(S) XRAY OF THE CHEST 10/28/2024 06:05:00 AM COMPARISON: PA and lateral chest 12/01/2005.  CLINICAL HISTORY: SOB FINDINGS: LINES, TUBES AND DEVICES: Overlying telemetry leads. LUNGS AND PLEURA: No focal pulmonary opacity. No pleural effusion. No pneumothorax. HEART AND MEDIASTINUM: The heart is slightly enlarged. No vascular congestion is seen. There is calcification in the transverse aorta with normal mediastinal configuration. BONES AND SOFT TISSUES: No acute osseous abnormality. IMPRESSION: 1. Slight cardiomegaly without signs of heart failure. 2. No acute cardiopulmonary process. Electronically signed by: Francis Quam MD 10/28/2024 06:18 AM EST RP Workstation: [id number]     PROCEDURES:  Critical Care performed: Yes, see critical care procedure note(s)   CRITICAL CARE Performed by: Josette Sink   Total critical care time: 30 minutes  Critical care time was exclusive of separately billable procedures and treating other patients.  Critical care was necessary to treat or prevent imminent or life-threatening deterioration.  Critical care was time spent personally by me on the following activities: development of treatment plan with patient and/or surrogate as well as nursing, discussions with consultants, evaluation of patient's response to treatment, examination of patient, obtaining history from patient or surrogate, ordering and performing treatments and interventions, ordering and  review of laboratory studies, ordering and review of radiographic studies, pulse oximetry and re-evaluation of patient's condition.   SABRA1-3 Lead EKG Interpretation  Performed by: Malillany Kazlauskas, Josette SAILOR, DO Authorized by: Kahil Agner, Josette SAILOR, DO     Interpretation: abnormal     ECG rate:  106   ECG rate assessment: tachycardic     Rhythm: atrial fibrillation     Ectopy: none     Conduction: normal       IMPRESSION / MDM / ASSESSMENT AND PLAN / ED COURSE  I reviewed the triage vital signs and the nursing notes.    Patient here with symptoms of shortness of breath, palpitations, vertigo, nausea without vomiting.  Found to be in A-fib with RVR with EMS.  The patient is on the cardiac monitor to evaluate for evidence of arrhythmia and/or significant heart rate changes.   DIFFERENTIAL DIAGNOSIS (includes but not limited to):   A-fib with RVR, electrolyte derangement, anemia, thyroid  dysfunction, UTI, dehydration, medication noncompliance, ACS, less likely PE or dissection, doubt sepsis.  Differential also includes BPPV.  Less likely CVA.   Patient's presentation is most consistent with acute presentation with potential threat to life or bodily function.   PLAN: Will obtain labs, urine.  Will start on diltiazem  infusion.  EKG nonischemic.  Will give IV fluids, meclizine  and Zofran  for symptomatic relief.  Low suspicion for stroke.  NIH stroke scale 0.   MEDICATIONS GIVEN IN ED: Medications  diltiazem  (CARDIZEM ) 125 mg in dextrose  5% 125 mL (1 mg/mL) infusion (5 mg/hr Intravenous New Bag/Given 10/28/24 0629)  sodium chloride  0.9 % bolus 1,000 mL (has no administration in time range)  meclizine  (ANTIVERT ) tablet 25 mg (25 mg Oral Given 10/28/24 0650)  ondansetron  (ZOFRAN ) injection 4 mg (4 mg Intravenous Given 10/28/24 0651)     ED COURSE: Labs show normal hemoglobin.  Normal electrolytes.  Troponin negative.  TSH normal but free T4 minimally elevated.  Chest x-ray reviewed and interpreted by  myself and the radiologist shows no acute abnormality.  Heart rate improving but still in atrial fibrillation.  I would not deem her a candidate for electrocardioversion given she has missed several doses of her medications.  Will admit to the hospitalist service.   CONSULTS:  Consulted and discussed patient's case with hospitalist, Dr. Cleatus.  I have recommended admission and consulting physician agrees and will place admission  orders.  Patient (and family if present) agree with this plan.   I reviewed all nursing notes, vitals, pertinent previous records.  All labs, EKGs, imaging ordered have been independently reviewed and interpreted by myself.    OUTSIDE RECORDS REVIEWED: Reviewed recent cardiology notes.       FINAL CLINICAL IMPRESSION(S) / ED DIAGNOSES   Final diagnoses:  Atrial fibrillation with RVR (HCC)     Rx / DC Orders   ED Discharge Orders     None        Note:  This document was prepared using Dragon voice recognition software and may include unintentional dictation errors.   Tranisha Tissue, Josette SAILOR, DO 10/28/24 [phone]

## 2024-10-28 NOTE — H&P (Signed)
 History and Physical  Bianca Malone FMW:[id number] DOB: 1935-06-04 DOA: 10/28/2024  PCP: Bertrum Charlie CROME, MD   Chief Complaint: Nausea, dizziness, palpitations  HPI: Bianca Malone is a 89 y.o. female with medical history significant for hypertension, GERD, paroxysmal atrial fibrillation being admitted to the hospital with A-fib with RVR.  She has an ongoing history of symptomatic bradycardia which improved after discontinuation of metoprolol , as well as some difficulty tolerating diltiazem  due to dizziness, lightheadedness, weakness, and lower extremity edema.  Most recently, her diltiazem  was cut in half from 60 mg twice daily to 30 mg twice daily and overall she has been doing well.  She presented to the emergency department yesterday 11/25 with symptomatic RVR, she spontaneously converted to sinus rhythm and was asymptomatic after receiving 12.5 mg of Cardizem  with EMS.  ER provider discussed with on-call cardiology, recommended cutting diltiazem  dose even further to 30 mg p.o. daily, and plan for outpatient cardiology follow-up.  Patient returned home about 5 PM last night, she returns early this morning with complaints of nausea, dizziness and palpitations.  Found once again to be in rapid A-fib.  She was given 10 mg IV Cardizem  by EMS.  Currently she is on Cardizem  drip with heart rate fluctuating 90-1 10.  Review of Systems: Please see HPI for pertinent positives and negatives. A complete 10 system review of systems are otherwise negative.  Past Medical History:  Diagnosis Date   Actinic keratosis    Arthritis    Atypical mole 08/31/2008   L suprapubic   COVID-19 01/2020   GERD (gastroesophageal reflux disease)    Hypertension    Vaginal prolapse    Past Surgical History:  Procedure Laterality Date   ABDOMINAL ADHESION SURGERY     small bowel lysis of adhesions   BREAST CYST ASPIRATION Left    CARPAL TUNNEL RELEASE     CATARACT EXTRACTION W/PHACO Left 03/14/2021   Procedure:  CATARACT EXTRACTION PHACO AND INTRAOCULAR LENS PLACEMENT (IOC) LEFT 9.46 01:01.2;  Surgeon: Jaye Fallow, MD;  Location: [facility];  Service: Ophthalmology;  Laterality: Left;   COLONOSCOPY     COLONOSCOPY WITH PROPOFOL  N/A 07/22/2017   Procedure: COLONOSCOPY WITH PROPOFOL ;  Surgeon: Viktoria Lamar DASEN, MD;  Location: [facility] ENDOSCOPY;  Service: Endoscopy;  Laterality: N/A;   CYST EXCISION     thyroglossal duct cyst surgery   ESOPHAGOGASTRODUODENOSCOPY (EGD) WITH PROPOFOL  N/A 07/22/2017   Procedure: ESOPHAGOGASTRODUODENOSCOPY (EGD) WITH PROPOFOL ;  Surgeon: Viktoria Lamar DASEN, MD;  Location: [facility] ENDOSCOPY;  Service: Endoscopy;  Laterality: N/A;   HYSTEROSCOPY     INCISION AND DRAINAGE / EXCISION THYROGLOSSAL CYST     UPPER GASTROINTESTINAL ENDOSCOPY     Social History:  reports that she has never smoked. She has never used smokeless tobacco. She reports current alcohol  use. She reports that she does not use drugs.  Allergies  Allergen Reactions   Amoxicillin Hives and Rash        Daypro  [Oxaprozin] Rash   Loratadine Rash   Other Rash    Ozpro-Daypro   Sulfa Antibiotics Rash    presuptive reaction    Family History  Problem Relation Age of Onset   Alzheimer's disease Mother    Hypertension Mother    Kidney failure Mother    Hypertension Father    CAD Father    Heart disease Brother    Anemia Brother    Heart disease Brother    Myelodysplastic syndrome Brother    Breast cancer Paternal Aunt  Prostate cancer Paternal Uncle      Prior to Admission medications   Medication Sig Start Date End Date Taking? Authorizing Provider  Acetaminophen  (TYLENOL  8 HOUR PO) Take by mouth.    [provider]  CALCIUM CARBONATE-VITAMIN D PO Take 2 tablets by mouth daily. Reported on 03/08/2016    [provider]  clidinium-chlordiazePOXIDE  (LIBRAX) 5-2.5 MG capsule Take 1 capsule by mouth 2 (two) times daily as needed. 04/07/20   Bertrum Charlie CROME, MD  dicyclomine   (BENTYL ) 20 MG tablet Take 1 tablet (20 mg total) by mouth every 6 (six) hours as needed for spasms. 09/26/21   Bertrum Charlie CROME, MD  ELIQUIS  5 MG TABS tablet Take 5 mg by mouth 2 (two) times daily. 03/15/22   [provider]  losartan  (COZAAR ) 100 MG tablet TAKE 1 TABLET BY MOUTH EVERY DAY 02/11/22   Bertrum Charlie CROME, MD  pantoprazole  (PROTONIX ) 40 MG tablet Take 1 tablet (40 mg total) by mouth daily. 10/12/22   Cyndi Shaver, PA-C  polyethylene glycol (MIRALAX / GLYCOLAX) packet Take 17 g by mouth daily as needed.    [provider]  promethazine  (PHENERGAN ) 25 MG tablet Take 1 tablet (25 mg total) by mouth every 6 (six) hours as needed for nausea or vomiting. 09/26/21   Bertrum Charlie CROME, MD    Physical Exam: BP (!) 148/71 (BP Location: Right Arm)   Pulse 90   Temp 97.7 F (36.5 C) (Oral)   Resp 19   SpO2 97%  General:  Alert, oriented, calm, in no acute distress, daughter is at the bedside Eyes: EOMI, clear conjuctivae, white sclerea Neck: supple, no masses, trachea mildline  Cardiovascular: Irregularly irregular, tachycardic, no murmurs or rubs, no peripheral edema  Respiratory: clear to auscultation bilaterally, no wheezes, no crackles  Abdomen: soft, nontender, nondistended, normal bowel tones heard  Skin: dry, no rashes  Musculoskeletal: no joint effusions, normal range of motion  Psychiatric: appropriate affect, normal speech  Neurologic: extraocular muscles intact, clear speech, moving all extremities with intact sensorium         Labs on Admission:  Basic Metabolic Panel: Recent Labs  Lab 10/28/24 0539  NA 136  K 3.5  CL 102  CO2 22  GLUCOSE 108*  BUN 22  CREATININE 0.81  CALCIUM 9.3  MG 1.9   Liver Function Tests: No results for input(s): AST, ALT, ALKPHOS, BILITOT, PROT, ALBUMIN in the last 168 hours. No results for input(s): LIPASE, AMYLASE in the last 168 hours. No results for input(s): AMMONIA in the last 168  hours. CBC: Recent Labs  Lab 10/28/24 0539  WBC 5.2  NEUTROABS 3.6  HGB 14.3  HCT 40.9  MCV 88.0  PLT 214   Cardiac Enzymes: No results for input(s): CKTOTAL, CKMB, CKMBINDEX, TROPONINI in the last 168 hours. BNP (last 3 results) No results for input(s): BNP in the last 8760 hours.  ProBNP (last 3 results) No results for input(s): PROBNP in the last 8760 hours.  CBG: No results for input(s): GLUCAP in the last 168 hours.  Radiological Exams on Admission: DG Chest Portable 1 View Result Date: 10/28/2024 EXAM: 1 VIEW(S) XRAY OF THE CHEST 10/28/2024 06:05:00 AM COMPARISON: PA and lateral chest 12/01/2005. CLINICAL HISTORY: SOB FINDINGS: LINES, TUBES AND DEVICES: Overlying telemetry leads. LUNGS AND PLEURA: No focal pulmonary opacity. No pleural effusion. No pneumothorax. HEART AND MEDIASTINUM: The heart is slightly enlarged. No vascular congestion is seen. There is calcification in the transverse aorta with normal mediastinal configuration. BONES  AND SOFT TISSUES: No acute osseous abnormality. IMPRESSION: 1. Slight cardiomegaly without signs of heart failure. 2. No acute cardiopulmonary process. Electronically signed by: Francis Quam MD 10/28/2024 06:18 AM EST RP Workstation: [id number]   Assessment/Plan Bianca Malone is a 89 y.o. female with medical history significant for hypertension, GERD, paroxysmal atrial fibrillation being admitted to the hospital with A-fib with RVR.  Atrial fibrillation with RVR-unfortunately she seems to not tolerate Cardizem  well, she has side effects including lethargy, dizziness, and lower extremity edema with Cardizem  and has now presented multiple times with rapid A-fib as her Cardizem  dose has been tapered down due to side effects.  She was previously on Toprol -XL but this was discontinued due to bradycardia. -Observation admission -Monitor closely on progressive -Continue IV Cardizem  drip -Inpatient cardiology consultation requested,  anticipate transition to alternative agent  Hypertension-continue losartan  50 mg p.o. daily  GERD-p.o. Protonix   DVT prophylaxis: Eliquis     Code Status: Full Code  Consults called: Cardiology  Admission status: Observation  Time spent: 53 minutes  Jayjay Littles CHRISTELLA Gail MD [facility] Pager [phone]  If 7PM-7AM, please contact night-coverage [email or web address] Password [facility]  10/28/2024, 8:38 AM

## 2024-10-28 NOTE — Consult Note (Signed)
 [facility]IN[facility] CARDIOLOGY CONSULT NOTE       Patient ID: Bianca Malone MRN: [id number] DOB/AGE: 87/24/1936 87 y.o.  Admit date: 10/28/2024 Referring Physician Dr. Katha Gail Primary Physician Bertrum Charlie CROME, MD  Primary Cardiologist Dr. Ammon Reason for Consultation AF RVR  HPI: [name] is a 89 y.o. female  with a past medical history of paroxysmal atrial fibrillation, hypertension, bradycardia when on BB, GERD who presented to the ED on 10/28/2024 for dizziness, palpitations. Cardiology was consulted for further evaluation.   Patient reports yesterday feeling very poor, weak and fatigued. Came to ED and this was thought to be due to AF. Given a dose of diltiazem  and discharged but symptoms persisted so she returned. Workup in the ED notable for creatinine 0.81, potassium 3.5, hemoglobin 14.3, WBC 5.2. Troponins <15. EKG in the ED atrial fibrillation rate 105 bpm. CXR today without acute abnormality.   At the time of my evaluation this afternoon, patient is resting comfortably in ED stretcher with daughter at bedside.  We discussed her symptoms in further detail.  She endorses having issues recently after starting p.o. diltiazem .  Had side effects of lightheadedness, swelling, shortness of breath that got worse when she started taking diltiazem .  Last Friday her symptoms were very significant after taking her evening dose of this medication but gradually overnight she began to feel better.  Over the weekend she felt relatively okay but then yesterday had recurrence of symptoms prompting her presentation.  She denies any significant issues with chest pain or palpitations but just reports feeling bad in general.  She has known history of atrial fibrillation and has been taking Eliquis  for a few years.   Review of systems complete and found to be negative unless listed above    Past Medical History:  Diagnosis Date   Actinic keratosis    Arthritis    Atypical mole 08/31/2008    L suprapubic   COVID-19 01/2020   GERD (gastroesophageal reflux disease)    Hypertension    Vaginal prolapse     Past Surgical History:  Procedure Laterality Date   ABDOMINAL ADHESION SURGERY     small bowel lysis of adhesions   BREAST CYST ASPIRATION Left    CARPAL TUNNEL RELEASE     CATARACT EXTRACTION W/PHACO Left 03/14/2021   Procedure: CATARACT EXTRACTION PHACO AND INTRAOCULAR LENS PLACEMENT (IOC) LEFT 9.46 01:01.2;  Surgeon: Jaye Fallow, MD;  Location: [facility];  Service: Ophthalmology;  Laterality: Left;   COLONOSCOPY     COLONOSCOPY WITH PROPOFOL  N/A 07/22/2017   Procedure: COLONOSCOPY WITH PROPOFOL ;  Surgeon: Viktoria Lamar DASEN, MD;  Location: [facility] ENDOSCOPY;  Service: Endoscopy;  Laterality: N/A;   CYST EXCISION     thyroglossal duct cyst surgery   ESOPHAGOGASTRODUODENOSCOPY (EGD) WITH PROPOFOL  N/A 07/22/2017   Procedure: ESOPHAGOGASTRODUODENOSCOPY (EGD) WITH PROPOFOL ;  Surgeon: Viktoria Lamar DASEN, MD;  Location: [facility] ENDOSCOPY;  Service: Endoscopy;  Laterality: N/A;   HYSTEROSCOPY     INCISION AND DRAINAGE / EXCISION THYROGLOSSAL CYST     UPPER GASTROINTESTINAL ENDOSCOPY      (Not in a hospital admission)  Social History   Socioeconomic History   Marital status: Widowed    Spouse name: Not on file   Number of children: 2   Years of education: Not on file   Highest education level: Some college, no degree  Occupational History   Not on file  Tobacco Use   Smoking status: Never   Smokeless tobacco: Never  Vaping  Use   Vaping status: Never Used  Substance and Sexual Activity   Alcohol  use: Yes    Alcohol /week: 0.0 - 3.0 standard drinks of alcohol    Drug use: No   Sexual activity: Yes    Birth control/protection: Condom  Other Topics Concern   Not on file  Social History Narrative   Not on file   Social Drivers of Health   Financial Resource Strain: Low Risk  (06/11/2024)   Received from [facility]   Overall Financial  Resource Strain (CARDIA)    Difficulty of Paying Living Expenses: Not hard at all  Food Insecurity: No Food Insecurity (06/11/2024)   Received from [facility]   Hunger Vital Sign    Within the past 12 months, you worried that your food would run out before you got the money to buy more.: Never true    Within the past 12 months, the food you bought just didn't last and you didn't have money to get more.: Never true  Transportation Needs: No Transportation Needs (06/11/2024)   Received from [facility] - Transportation    In the past 12 months, has lack of transportation kept you from medical appointments or from getting medications?: No    Lack of Transportation (Non-Medical): No  Physical Activity: Sufficiently Active (03/27/2022)   Exercise Vital Sign    Days of Exercise per Week: 4 days    Minutes of Exercise per Session: 60 min  Stress: No Stress Concern Present (03/27/2022)   [employer] - Occupational Stress Questionnaire    Feeling of Stress : Not at all  Social Connections: Unknown (03/27/2022)   Social Connection and Isolation Panel    Frequency of Communication with Friends and Family: Not on file    Frequency of Social Gatherings with Friends and Family: Once a week    Attends Religious Services: More than 4 times per year    Active Member of [employer] or Organizations: Yes    Attends Engineer, Structural: More than 4 times per year    Marital Status: Married  Catering Manager Violence: Not At Risk (03/27/2022)   Humiliation, Afraid, Rape, and Kick questionnaire    Fear of Current or Ex-Partner: No    Emotionally Abused: No    Physically Abused: No    Sexually Abused: No    Family History  Problem Relation Age of Onset   Alzheimer's disease Mother    Hypertension Mother    Kidney failure Mother    Hypertension Father    CAD Father    Heart disease Brother    Anemia Brother    Heart disease  Brother    Myelodysplastic syndrome Brother    Breast cancer Paternal Aunt    Prostate cancer Paternal Uncle      Vitals:   10/28/24 0536  BP: (!) 148/71  Pulse: 90  Resp: 19  Temp: 97.7 F (36.5 C)  TempSrc: Oral  SpO2: 97%    PHYSICAL EXAM General: Chronically ill appearing elderly female, well nourished, in no acute distress. HEENT: Normocephalic and atraumatic. Neck: No JVD.  Lungs: Normal respiratory effort on room air. Clear bilaterally to auscultation. No wheezes, crackles, rhonchi.  Heart: Irregularly irregular, elevated rate. Normal S1 and S2 without gallops or murmurs.  Abdomen: Non-distended appearing.  Msk: Normal strength and tone for age. Extremities: Warm and well perfused. No clubbing, cyanosis. Trace edema.  Neuro: Alert and oriented X 3. Psych:  Answers questions appropriately.   Labs: Basic Metabolic Panel: Recent Labs    10/28/24 0539  NA 136  K 3.5  CL 102  CO2 22  GLUCOSE 108*  BUN 22  CREATININE 0.81  CALCIUM 9.3  MG 1.9   Liver Function Tests: No results for input(s): AST, ALT, ALKPHOS, BILITOT, PROT, ALBUMIN in the last 72 hours. No results for input(s): LIPASE, AMYLASE in the last 72 hours. CBC: Recent Labs    10/28/24 0539  WBC 5.2  NEUTROABS 3.6  HGB 14.3  HCT 40.9  MCV 88.0  PLT 214   Cardiac Enzymes: No results for input(s): CKTOTAL, CKMB, CKMBINDEX, TROPONINIHS in the last 72 hours. BNP: No results for input(s): BNP in the last 72 hours. D-Dimer: No results for input(s): DDIMER in the last 72 hours. Hemoglobin A1C: No results for input(s): HGBA1C in the last 72 hours. Fasting Lipid Panel: No results for input(s): CHOL, HDL, LDLCALC, TRIG, CHOLHDL, LDLDIRECT in the last 72 hours. Thyroid  Function Tests: Recent Labs    10/28/24 0539  TSH 2.540   Anemia Panel: No results for input(s): VITAMINB12, FOLATE, FERRITIN, TIBC, IRON, RETICCTPCT in the last 72 hours.    Radiology: DG Chest Portable 1 View Result Date: 10/28/2024 EXAM: 1 VIEW(S) XRAY OF THE CHEST 10/28/2024 06:05:00 AM COMPARISON: PA and lateral chest 12/01/2005. CLINICAL HISTORY: SOB FINDINGS: LINES, TUBES AND DEVICES: Overlying telemetry leads. LUNGS AND PLEURA: No focal pulmonary opacity. No pleural effusion. No pneumothorax. HEART AND MEDIASTINUM: The heart is slightly enlarged. No vascular congestion is seen. There is calcification in the transverse aorta with normal mediastinal configuration. BONES AND SOFT TISSUES: No acute osseous abnormality. IMPRESSION: 1. Slight cardiomegaly without signs of heart failure. 2. No acute cardiopulmonary process. Electronically signed by: Francis Quam MD 10/28/2024 06:18 AM EST RP Workstation: [id number]    ECHO ordered  TELEMETRY (personally reviewed): atrial fibrillation rate 110s  EKG (personally reviewed): atrial fibrillation rate 105 bpm  Data reviewed by me 10/28/2024: last 24h vitals tele labs imaging I/O ED provider note, admission H&P  Principal Problem:   Atrial fibrillation with rapid ventricular response (HCC)    ASSESSMENT AND PLAN:  LAISHA RAU is a 89 y.o. female  with a past medical history of paroxysmal atrial fibrillation, hypertension, bradycardia when on BB, GERD who presented to the ED on 10/28/2024 for dizziness, palpitations. Cardiology was consulted for further evaluation.   # Atrial fibrillation RVR # Paroxysmal atrial fibrillation # Hypertension Patient presented with complaints of weakness/lightheadedness. Noted to be in AF RVR. Recently started on diltiazem , ?symptoms related to this medication vs being in AF. Hx of bradycardia with beta blockers.  -Given limited options for rate control as she likely has side effects of diltiazem  and hx of bradycardia with metoprolol , will start IV amiodarone .  -Continue eliquis  5 mg twice daily for stroke risk reduction.  -Continue home losartan  50 mg daily.  -Echo ordered, further  recommendations pending these results.    This patient's plan of care was discussed and created with Dr. Custovic and she is in agreement.  Signed: Danita Bloch, PA-C  10/28/2024, 9:22 AM [facility] Cardiology

## 2024-10-28 NOTE — ED Notes (Addendum)
 Family and pt have requested to speak with cardiologies before new bag of Amiodarone  is hanged. PA Hudson notified.

## 2024-10-28 NOTE — ED Notes (Signed)
 This NT assisted pt to the toilet and back to bed. Pt ambulated well and with little assistance. Pt resting comfortably in bed.

## 2024-10-28 NOTE — ED Triage Notes (Signed)
 BIBA from home due to SOB, nausea, room spinning and uncontrolled Afib RVR.  She was here in the ED and released at 5pm.   Per EMS:  Gave 10mg  cardizem  and 4mg  Zofran  18G in LAC 152/76 HR was in the 160's and decreased to be 110-120's

## 2024-10-29 DIAGNOSIS — I4891 Unspecified atrial fibrillation: Secondary | ICD-10-CM

## 2024-10-29 LAB — CBC
HCT: 38.1 % (ref 36.0–46.0)
Hemoglobin: 13 g/dL (ref 12.0–15.0)
MCH: 30.4 pg (ref 26.0–34.0)
MCHC: 34.1 g/dL (ref 30.0–36.0)
MCV: 89 fL (ref 80.0–100.0)
Platelets: 208 K/uL (ref 150–400)
RBC: 4.28 MIL/uL (ref 3.87–5.11)
RDW: 12.8 % (ref 11.5–15.5)
WBC: 6.3 K/uL (ref 4.0–10.5)
nRBC: 0 % (ref 0.0–0.2)

## 2024-10-29 LAB — ECHOCARDIOGRAM COMPLETE
Area-P 1/2: 5.52 cm2
MV M vel: 5.75 m/s
MV Peak grad: 132 mmHg
P 1/2 time: 375 ms
S' Lateral: 2.9 cm

## 2024-10-29 LAB — BASIC METABOLIC PANEL WITH GFR
Anion gap: 8 (ref 5–15)
BUN: 27 mg/dL — ABNORMAL HIGH (ref 8–23)
CO2: 25 mmol/L (ref 22–32)
Calcium: 9.3 mg/dL (ref 8.9–10.3)
Chloride: 99 mmol/L (ref 98–111)
Creatinine, Ser: 1.28 mg/dL — ABNORMAL HIGH (ref 0.44–1.00)
GFR, Estimated: 40 mL/min — ABNORMAL LOW (ref >60)
Glucose, Bld: 94 mg/dL (ref 70–99)
Potassium: 4.2 mmol/L (ref 3.5–5.1)
Sodium: 132 mmol/L — ABNORMAL LOW (ref 135–145)

## 2024-10-29 NOTE — Care Management Obs Status (Signed)
 MEDICARE OBSERVATION STATUS NOTIFICATION   Patient Details  Name: Bianca Malone MRN: [id number] Date of Birth: 24-Jul-1935   Medicare Observation Status Notification Given:  Yes    Rojelio SHAUNNA Rattler 10/29/2024, 3:07 PM

## 2024-10-29 NOTE — ED Notes (Signed)
  at bedside

## 2024-10-29 NOTE — Progress Notes (Signed)
 PROGRESS NOTE    Bianca Malone  FMW:[id number] DOB: 1935-02-28 DOA: 10/28/2024 PCP: Bertrum Charlie CROME, MD    Brief Narrative:  89 y.o. female with medical history significant for hypertension, GERD, paroxysmal atrial fibrillation being admitted to the hospital with A-fib with RVR.  She has an ongoing history of symptomatic bradycardia which improved after discontinuation of metoprolol , as well as some difficulty tolerating diltiazem  due to dizziness, lightheadedness, weakness, and lower extremity edema.  Most recently, her diltiazem  was cut in half from 60 mg twice daily to 30 mg twice daily and overall she has been doing well.  She presented to the emergency department yesterday 11/25 with symptomatic RVR, she spontaneously converted to sinus rhythm and was asymptomatic after receiving 12.5 mg of Cardizem  with EMS.  ER provider discussed with on-call cardiology, recommended cutting diltiazem  dose even further to 30 mg p.o. daily, and plan for outpatient cardiology follow-up.  Patient returned home about 5 PM last night, she returns early this morning with complaints of nausea, dizziness and palpitations.  Found once again to be in rapid A-fib.  She was given 10 mg IV Cardizem  by EMS.  Currently she is on Cardizem  drip with heart rate fluctuating 90-1 10.    Assessment & Plan:   Principal Problem:   Atrial fibrillation with rapid ventricular response (HCC)  Bianca Malone is a 89 y.o. female with medical history significant for hypertension, GERD, paroxysmal atrial fibrillation being admitted to the hospital with A-fib with RVR.   Atrial fibrillation with RVR-unfortunately she seems to not tolerate Cardizem  well, she has side effects including lethargy, dizziness, and lower extremity edema with Cardizem  and has now presented multiple times with rapid A-fib as her Cardizem  dose has been tapered down due to side effects.  She was previously on Toprol -XL but this was discontinued due to  bradycardia. Plan: Currently on p.o. amiodarone .  Echocardiogram completed.  Pending cardiology follow-up for further management recommendations.  Continue Eliquis   Hypertension-continue losartan  50 mg daily   GERD-p.o. Protonix   Weakness, fatigue Therapy evaluations   DVT prophylaxis: Eliquis  Code Status: Full Family Communication: None Disposition Plan: Status is: Observation The patient will require care spanning > 2 midnights and should be moved to inpatient because: Ongoing rate control for rapid atrial fibrillation refractory to previous therapies.  Pending cardiology follow-up.   Level of care: Telemetry  Consultants:  Cardiology-[facility]  Procedures:  None  Antimicrobials: None   Subjective: Seen and examined.  Resting bed.  Main complaint is weakness.  No pain complaints.  Objective: Vitals:   10/29/24 0836 10/29/24 1000 10/29/24 1100 10/29/24 1253  BP:  (!) 117/50 (!) 149/52   Pulse:  64 60   Resp:  14 17   Temp: 97.9 F (36.6 C)   97.6 F (36.4 C)  TempSrc: Oral   Oral  SpO2:  99% 100%    No intake or output data in the 24 hours ending 10/29/24 1433 There were no vitals filed for this visit.  Examination:  General exam: Frail-appearing Respiratory system: Clear to auscultation. Respiratory effort normal. Cardiovascular system: 1 S2, RRR, no murmurs, pedal edema Gastrointestinal system: Soft, NT/ND, normal bowel sounds Central nervous system: Alert and oriented. No focal neurological deficits. Extremities: Symmetric 5 x 5 power. Skin: No rashes, lesions or ulcers Psychiatry: Judgement and insight appear normal. Mood & affect appropriate.     Data Reviewed: I have personally reviewed following labs and imaging studies  CBC: Recent Labs  Lab 10/28/24 0539 10/29/24  0439  WBC 5.2 6.3  NEUTROABS 3.6  --   HGB 14.3 13.0  HCT 40.9 38.1  MCV 88.0 89.0  PLT 214 208   Basic Metabolic Panel: Recent Labs  Lab 10/28/24 0539  10/29/24 0439  NA 136 132*  K 3.5 4.2  CL 102 99  CO2 22 25  GLUCOSE 108* 94  BUN 22 27*  CREATININE 0.81 1.28*  CALCIUM 9.3 9.3  MG 1.9  --    GFR: Estimated Creatinine Clearance: 27.9 mL/min (A) (by C-G formula based on SCr of 1.28 mg/dL (H)). Liver Function Tests: No results for input(s): AST, ALT, ALKPHOS, BILITOT, PROT, ALBUMIN in the last 168 hours. No results for input(s): LIPASE, AMYLASE in the last 168 hours. No results for input(s): AMMONIA in the last 168 hours. Coagulation Profile: No results for input(s): INR, PROTIME in the last 168 hours. Cardiac Enzymes: No results for input(s): CKTOTAL, CKMB, CKMBINDEX, TROPONINI in the last 168 hours. BNP (last 3 results) No results for input(s): PROBNP in the last 8760 hours. HbA1C: No results for input(s): HGBA1C in the last 72 hours. CBG: No results for input(s): GLUCAP in the last 168 hours. Lipid Profile: No results for input(s): CHOL, HDL, LDLCALC, TRIG, CHOLHDL, LDLDIRECT in the last 72 hours. Thyroid  Function Tests: Recent Labs    10/28/24 0539  TSH 2.540  FREET4 1.27*   Anemia Panel: No results for input(s): VITAMINB12, FOLATE, FERRITIN, TIBC, IRON, RETICCTPCT in the last 72 hours. Sepsis Labs: No results for input(s): PROCALCITON, LATICACIDVEN in the last 168 hours.  No results found for this or any previous visit (from the past 240 hours).       Radiology Studies: ECHOCARDIOGRAM COMPLETE Result Date: 10/29/2024    ECHOCARDIOGRAM REPORT   Patient Name:   Bianca Malone Date of Exam: 10/28/2024 Medical Rec #:  [id number]   Height:       65.0 in Accession #:    [id number]  Weight:       135.6 lb Date of Birth:  1935-08-12    BSA:          1.677 m Patient Age:    89 years    BP:           117/53 mmHg Patient Gender: F           HR:           68 bpm. Exam Location:  [facility] Procedure: 2D Echo, Cardiac Doppler and Color Doppler (Both Spectral and Color             Flow Doppler were utilized during procedure). Indications:     I48.91 Atrial Fibrillation  History:         Patient has no prior history of Echocardiogram examinations.                  Risk Factors:Hypertension.  Sonographer:     Carl Coma RDCS Referring Phys:  [id number] CARALYN HUDSON Diagnosing Phys: Sabina Custovic IMPRESSIONS  1. Left ventricular ejection fraction, by estimation, is 60 to 65%. The left ventricle has normal function. The left ventricle has no regional wall motion abnormalities. Left ventricular diastolic parameters are consistent with Grade I diastolic dysfunction (impaired relaxation).  2. Right ventricular systolic function is normal. The right ventricular size is normal.  3. The mitral valve is normal in structure. Moderate mitral valve regurgitation. No evidence of mitral stenosis.  4. Tricuspid valve regurgitation is mild to moderate.  5. The aortic valve is normal  in structure. Aortic valve regurgitation is moderate. No aortic stenosis is present. Aortic regurgitation PHT measures 375 msec.  6. The inferior vena cava is normal in size with greater than 50% respiratory variability, suggesting right atrial pressure of 3 mmHg. FINDINGS  Left Ventricle: Left ventricular ejection fraction, by estimation, is 60 to 65%. The left ventricle has normal function. The left ventricle has no regional wall motion abnormalities. The left ventricular internal cavity size was normal in size. There is  no left ventricular hypertrophy. Left ventricular diastolic parameters are consistent with Grade I diastolic dysfunction (impaired relaxation). Right Ventricle: The right ventricular size is normal. No increase in right ventricular wall thickness. Right ventricular systolic function is normal. Left Atrium: Left atrial size was normal in size. Right Atrium: Right atrial size was normal in size. Pericardium: There is no evidence of pericardial effusion. Mitral Valve: The mitral valve is normal  in structure. Moderate mitral valve regurgitation. No evidence of mitral valve stenosis. Tricuspid Valve: The tricuspid valve is normal in structure. Tricuspid valve regurgitation is mild to moderate. The aortic valve is normal in structure. Aortic valve regurgitation is moderate. . No aortic stenosis is present. Pulmonic Valve: The pulmonic valve was normal in structure. Pulmonic valve regurgitation is not visualized. Aorta: The aortic root is normal in size and structure. Venous: The inferior vena cava is normal in size with greater than 50% respiratory variability, suggesting right atrial pressure of 3 mmHg. IAS/Shunts: No atrial level shunt detected by color flow Doppler.  LEFT VENTRICLE PLAX 2D LVIDd:         4.10 cm   Diastology LVIDs:         2.90 cm   LV e' medial:    10.30 cm/s LV PW:         0.90 cm   LV E/e' medial:  10.8 LV IVS:        0.90 cm   LV e' lateral:   9.90 cm/s LVOT diam:     1.80 cm   LV E/e' lateral: 11.2 LV SV:         41 LV SV Index:   24 LVOT Area:     2.54 cm  RIGHT VENTRICLE             IVC RV Basal diam:  3.60 cm     IVC diam: 1.60 cm RV S prime:     12.00 cm/s TAPSE (M-mode): 2.8 cm LEFT ATRIUM             Index        RIGHT ATRIUM           Index LA diam:        3.90 cm 2.33 cm/m   RA Area:     15.80 cm LA Vol (A2C):   85.9 ml 51.23 ml/m  RA Volume:   44.60 ml  26.60 ml/m LA Vol (A4C):   42.3 ml 25.23 ml/m LA Biplane Vol: 66.8 ml 39.84 ml/m  AORTIC VALVE LVOT Vmax:   74.55 cm/s LVOT Vmean:  49.900 cm/s LVOT VTI:    0.160 m AI PHT:      375 msec  AORTA Ao Root diam: 2.80 cm MITRAL VALVE                TRICUSPID VALVE MV Area (PHT): 5.52 cm     TR Peak grad:   24.0 mmHg MV Decel Time: 138 msec     TR Vmax:  245.00 cm/s MR Peak grad: 132.0 mmHg MR Vmax:      574.50 cm/s   SHUNTS MV E velocity: 111.00 cm/s  Systemic VTI:  0.16 m MV A velocity: 59.95 cm/s   Systemic Diam: 1.80 cm MV E/A ratio:  1.85 Sabina Custovic Electronically signed by Annalee Casa Signature  Date/Time: 10/29/2024/10:19:52 AM    Final    DG Chest Portable 1 View Result Date: 10/28/2024 EXAM: 1 VIEW(S) XRAY OF THE CHEST 10/28/2024 06:05:00 AM COMPARISON: PA and lateral chest 12/01/2005. CLINICAL HISTORY: SOB FINDINGS: LINES, TUBES AND DEVICES: Overlying telemetry leads. LUNGS AND PLEURA: No focal pulmonary opacity. No pleural effusion. No pneumothorax. HEART AND MEDIASTINUM: The heart is slightly enlarged. No vascular congestion is seen. There is calcification in the transverse aorta with normal mediastinal configuration. BONES AND SOFT TISSUES: No acute osseous abnormality. IMPRESSION: 1. Slight cardiomegaly without signs of heart failure. 2. No acute cardiopulmonary process. Electronically signed by: Francis Quam MD 10/28/2024 06:18 AM EST RP Workstation: [id number]        Scheduled Meds:  amiodarone   200 mg Oral BID   Followed by   NOREEN ON 11/05/2024] amiodarone   200 mg Oral Daily   apixaban   5 mg Oral BID   losartan   50 mg Oral Daily   pantoprazole   40 mg Oral Daily   Continuous Infusions:  sodium chloride        LOS: 0 days   Calvin KATHEE Robson, MD [facility]   If 7PM-7AM, please contact night-coverage  10/29/2024, 2:33 PM

## 2024-10-30 DIAGNOSIS — I4891 Unspecified atrial fibrillation: Secondary | ICD-10-CM | POA: Diagnosis not present

## 2024-10-30 MED ORDER — AMIODARONE HCL 200 MG PO TABS: ORAL_TABLET | ORAL | 0 refills | Status: AC

## 2024-10-30 MED ORDER — APIXABAN 2.5 MG PO TABS: 2.5000 mg | ORAL_TABLET | Freq: Two times a day (BID) | ORAL | Status: DC

## 2024-10-30 MED ORDER — POLYETHYLENE GLYCOL 3350 17 G PO PACK
17.0000 g | PACK | Freq: Every day | ORAL | Status: DC
  Administered 2024-10-30: 17 g via ORAL
  Filled 2024-10-30: qty 1

## 2024-10-30 NOTE — Plan of Care (Signed)

## 2024-10-30 NOTE — Discharge Summary (Signed)
 Physician Discharge Summary  Bianca Malone FMW:[id number] DOB: 23-May-1935 DOA: 10/28/2024  PCP: Bertrum Charlie CROME, MD  Admit date: 10/28/2024 Discharge date: 10/30/2024  Admitted From: Home Disposition:  Home  Recommendations for Outpatient Follow-up:  Follow up with PCP in 1-2 weeks Follow up with [facility] cardiology  Home Health:No  Equipment/Devices:None   Discharge Condition:Stable  CODE STATUS:FULL  Diet recommendation: Heart  Brief/Interim Summary:  89 y.o. female with medical history significant for hypertension, GERD, paroxysmal atrial fibrillation being admitted to the hospital with A-fib with RVR.  She has an ongoing history of symptomatic bradycardia which improved after discontinuation of metoprolol , as well as some difficulty tolerating diltiazem  due to dizziness, lightheadedness, weakness, and lower extremity edema.  Most recently, her diltiazem  was cut in half from 60 mg twice daily to 30 mg twice daily and overall she has been doing well.  She presented to the emergency department yesterday 11/25 with symptomatic RVR, she spontaneously converted to sinus rhythm and was asymptomatic after receiving 12.5 mg of Cardizem  with EMS.  ER provider discussed with on-call cardiology, recommended cutting diltiazem  dose even further to 30 mg p.o. daily, and plan for outpatient cardiology follow-up.  Patient returned home about 5 PM last night, she returns early this morning with complaints of nausea, dizziness and palpitations.  Found once again to be in rapid A-fib.  She was given 10 mg IV Cardizem  by EMS.  Currently she is on Cardizem  drip with heart rate fluctuating 90-1 10.      Discharge Diagnoses:  Principal Problem:   Atrial fibrillation with rapid ventricular response (HCC)  Bianca Malone is a 89 y.o. female with medical history significant for hypertension, GERD, paroxysmal atrial fibrillation being admitted to the hospital with A-fib with RVR.   Atrial fibrillation with  RVR-unfortunately she seems to not tolerate Cardizem  well, she has side effects including lethargy, dizziness, and lower extremity edema with Cardizem  and has now presented multiple times with rapid A-fib as her Cardizem  dose has been tapered down due to side effects.  She was previously on Toprol -XL but this was discontinued due to bradycardia. Plan: Discharged on p.o. amiodarone  200 mg x 7 days followed by 200 mg daily.  Follow-up in [facility] clinic cardiology office.  Resume low-dose Eliquis    Hypertension-continue losartan  50 mg daily   GERD-p.o. Protonix    Weakness, fatigue Therapy evaluations No follow-up recommended   Discharge Instructions  Discharge Instructions     Diet - low sodium heart healthy   Complete by: As directed    Increase activity slowly   Complete by: As directed       Allergies as of 10/30/2024       Reactions   Amoxicillin Hives, Rash      Daypro  [oxaprozin] Rash   Loratadine Rash   Other Rash   Ozpro-Daypro   Sulfa Antibiotics Rash   presuptive reaction        Medication List     STOP taking these medications    clidinium-chlordiazePOXIDE  5-2.5 MG capsule Commonly known as: LIBRAX   dicyclomine  20 MG tablet Commonly known as: BENTYL    diltiazem  60 MG tablet Commonly known as: CARDIZEM        TAKE these medications    amiodarone  200 MG tablet Commonly known as: PACERONE  Take 1 tablet (200 mg total) by mouth 2 (two) times daily for 6 days, THEN 1 tablet (200 mg total) daily. Start taking on: October 30, 2024   CALCIUM CARBONATE-VITAMIN D PO Take 2 tablets  by mouth daily. Reported on 03/08/2016   Eliquis  2.5 MG Tabs tablet Generic drug: apixaban  Take 2.5 mg by mouth 2 (two) times daily. What changed: Another medication with the same name was removed. Continue taking this medication, and follow the directions you see here.   losartan  50 MG tablet Commonly known as: COZAAR  Take 50 mg by mouth daily. What changed: Another  medication with the same name was removed. Continue taking this medication, and follow the directions you see here.   pantoprazole  40 MG tablet Commonly known as: PROTONIX  Take 1 tablet (40 mg total) by mouth daily.   polyethylene glycol 17 g packet Commonly known as: MIRALAX / GLYCOLAX Take 17 g by mouth daily as needed.   promethazine  25 MG tablet Commonly known as: PHENERGAN  Take 1 tablet (25 mg total) by mouth every 6 (six) hours as needed for nausea or vomiting.   TYLENOL  8 HOUR PO Take by mouth.        Follow-up Information     Paraschos, Alexander, MD. Go in 2 week(s).   Specialty: Cardiology Why: 11/17/2024  2:00 PM Contact information: [address] [facility] [place] [address] [phone]         [facility]. Go in 1 week(s).   Why: Appointment scheduled for 11/03/2024 at 11 AM Contact information: [address] [place], [place] [address] [phone]        Bertrum Charlie CROME, MD. Schedule an appointment as soon as possible for a visit in 1 week(s).   Specialty: Family Medicine Contact information: [address] [place] [address] [phone]                Allergies  Allergen Reactions   Amoxicillin Hives and Rash        Daypro  [Oxaprozin] Rash   Loratadine Rash   Other Rash    Ozpro-Daypro   Sulfa Antibiotics Rash    presuptive reaction    Consultations: Cardiology   Procedures/Studies: ECHOCARDIOGRAM COMPLETE Result Date: 10/29/2024    ECHOCARDIOGRAM REPORT   Patient Name:   Bianca Malone Date of Exam: 10/28/2024 Medical Rec #:  [id number]   Height:       65.0 in Accession #:    [id number]  Weight:       135.6 lb Date of Birth:  12-07-1934    BSA:          1.677 m Patient Age:    89 years    BP:           117/53 mmHg Patient Gender: F           HR:           68 bpm. Exam Location:  [facility] Procedure: 2D Echo, Cardiac Doppler and Color Doppler (Both  Spectral and Color            Flow Doppler were utilized during procedure). Indications:     I48.91 Atrial Fibrillation  History:         Patient has no prior history of Echocardiogram examinations.                  Risk Factors:Hypertension.  Sonographer:     Carl Coma RDCS Referring Phys:  [id number] CARALYN HUDSON Diagnosing Phys: Sabina Custovic IMPRESSIONS  1. Left ventricular ejection fraction, by estimation, is 60 to 65%. The left ventricle has normal function. The left ventricle has no regional wall motion abnormalities. Left ventricular diastolic parameters  are consistent with Grade I diastolic dysfunction (impaired relaxation).  2. Right ventricular systolic function is normal. The right ventricular size is normal.  3. The mitral valve is normal in structure. Moderate mitral valve regurgitation. No evidence of mitral stenosis.  4. Tricuspid valve regurgitation is mild to moderate.  5. The aortic valve is normal in structure. Aortic valve regurgitation is moderate. No aortic stenosis is present. Aortic regurgitation PHT measures 375 msec.  6. The inferior vena cava is normal in size with greater than 50% respiratory variability, suggesting right atrial pressure of 3 mmHg. FINDINGS  Left Ventricle: Left ventricular ejection fraction, by estimation, is 60 to 65%. The left ventricle has normal function. The left ventricle has no regional wall motion abnormalities. The left ventricular internal cavity size was normal in size. There is  no left ventricular hypertrophy. Left ventricular diastolic parameters are consistent with Grade I diastolic dysfunction (impaired relaxation). Right Ventricle: The right ventricular size is normal. No increase in right ventricular wall thickness. Right ventricular systolic function is normal. Left Atrium: Left atrial size was normal in size. Right Atrium: Right atrial size was normal in size. Pericardium: There is no evidence of pericardial effusion. Mitral Valve: The  mitral valve is normal in structure. Moderate mitral valve regurgitation. No evidence of mitral valve stenosis. Tricuspid Valve: The tricuspid valve is normal in structure. Tricuspid valve regurgitation is mild to moderate. The aortic valve is normal in structure. Aortic valve regurgitation is moderate. . No aortic stenosis is present. Pulmonic Valve: The pulmonic valve was normal in structure. Pulmonic valve regurgitation is not visualized. Aorta: The aortic root is normal in size and structure. Venous: The inferior vena cava is normal in size with greater than 50% respiratory variability, suggesting right atrial pressure of 3 mmHg. IAS/Shunts: No atrial level shunt detected by color flow Doppler.  LEFT VENTRICLE PLAX 2D LVIDd:         4.10 cm   Diastology LVIDs:         2.90 cm   LV e' medial:    10.30 cm/s LV PW:         0.90 cm   LV E/e' medial:  10.8 LV IVS:        0.90 cm   LV e' lateral:   9.90 cm/s LVOT diam:     1.80 cm   LV E/e' lateral: 11.2 LV SV:         41 LV SV Index:   24 LVOT Area:     2.54 cm  RIGHT VENTRICLE             IVC RV Basal diam:  3.60 cm     IVC diam: 1.60 cm RV S prime:     12.00 cm/s TAPSE (M-mode): 2.8 cm LEFT ATRIUM             Index        RIGHT ATRIUM           Index LA diam:        3.90 cm 2.33 cm/m   RA Area:     15.80 cm LA Vol (A2C):   85.9 ml 51.23 ml/m  RA Volume:   44.60 ml  26.60 ml/m LA Vol (A4C):   42.3 ml 25.23 ml/m LA Biplane Vol: 66.8 ml 39.84 ml/m  AORTIC VALVE LVOT Vmax:   74.55 cm/s LVOT Vmean:  49.900 cm/s LVOT VTI:    0.160 m AI PHT:      375 msec  AORTA Ao Root diam: 2.80 cm MITRAL VALVE                TRICUSPID VALVE MV Area (PHT): 5.52 cm     TR Peak grad:   24.0 mmHg MV Decel Time: 138 msec     TR Vmax:        245.00 cm/s MR Peak grad: 132.0 mmHg MR Vmax:      574.50 cm/s   SHUNTS MV E velocity: 111.00 cm/s  Systemic VTI:  0.16 m MV A velocity: 59.95 cm/s   Systemic Diam: 1.80 cm MV E/A ratio:  1.85 Sabina Custovic Electronically signed by Annalee Casa Signature Date/Time: 10/29/2024/10:19:52 AM    Final    DG Chest Portable 1 View Result Date: 10/28/2024 EXAM: 1 VIEW(S) XRAY OF THE CHEST 10/28/2024 06:05:00 AM COMPARISON: PA and lateral chest 12/01/2005. CLINICAL HISTORY: SOB FINDINGS: LINES, TUBES AND DEVICES: Overlying telemetry leads. LUNGS AND PLEURA: No focal pulmonary opacity. No pleural effusion. No pneumothorax. HEART AND MEDIASTINUM: The heart is slightly enlarged. No vascular congestion is seen. There is calcification in the transverse aorta with normal mediastinal configuration. BONES AND SOFT TISSUES: No acute osseous abnormality. IMPRESSION: 1. Slight cardiomegaly without signs of heart failure. 2. No acute cardiopulmonary process. Electronically signed by: Francis Quam MD 10/28/2024 06:18 AM EST RP Workstation: [id number]      Subjective: Seen and examined on the day of discharge.  Stable no distress.  Daughter at bedside.  Patient appropriate for discharge home.  Discharge Exam: Vitals:   10/30/24 0610 10/30/24 0820  BP: (!) 142/68 (!) 127/54  Pulse:  65  Resp:    Temp:  (!) 97.5 F (36.4 C)  SpO2:  99%   Vitals:   10/29/24 2309 10/30/24 0518 10/30/24 0610 10/30/24 0820  BP: (!) 140/50 (!) 142/103 (!) 142/68 (!) 127/54  Pulse: 63 65  65  Resp: 18 18    Temp: 97.9 F (36.6 C) 97.7 F (36.5 C)  (!) 97.5 F (36.4 C)  TempSrc:      SpO2: 99% 98%  99%    General: Pt is alert, awake, not in acute distress Cardiovascular: RRR, S1/S2 +, no rubs, no gallops Respiratory: CTA bilaterally, no wheezing, no rhonchi Abdominal: Soft, NT, ND, bowel sounds + Extremities: no edema, no cyanosis    The results of significant diagnostics from this hospitalization (including imaging, microbiology, ancillary and laboratory) are listed below for reference.     Microbiology: No results found for this or any previous visit (from the past 240 hours).   Labs: BNP (last 3 results) No results for input(s): BNP in the  last 8760 hours. Basic Metabolic Panel: Recent Labs  Lab 10/28/24 0539 10/29/24 0439  NA 136 132*  K 3.5 4.2  CL 102 99  CO2 22 25  GLUCOSE 108* 94  BUN 22 27*  CREATININE 0.81 1.28*  CALCIUM 9.3 9.3  MG 1.9  --    Liver Function Tests: No results for input(s): AST, ALT, ALKPHOS, BILITOT, PROT, ALBUMIN in the last 168 hours. No results for input(s): LIPASE, AMYLASE in the last 168 hours. No results for input(s): AMMONIA in the last 168 hours. CBC: Recent Labs  Lab 10/28/24 0539 10/29/24 0439  WBC 5.2 6.3  NEUTROABS 3.6  --   HGB 14.3 13.0  HCT 40.9 38.1  MCV 88.0 89.0  PLT 214 208   Cardiac Enzymes: No results for input(s): CKTOTAL, CKMB, CKMBINDEX, TROPONINI in the last 168 hours. BNP: Invalid input(s):  POCBNP CBG: No results for input(s): GLUCAP in the last 168 hours. D-Dimer No results for input(s): DDIMER in the last 72 hours. Hgb A1c No results for input(s): HGBA1C in the last 72 hours. Lipid Profile No results for input(s): CHOL, HDL, LDLCALC, TRIG, CHOLHDL, LDLDIRECT in the last 72 hours. Thyroid  function studies Recent Labs    10/28/24 0539  TSH 2.540   Anemia work up No results for input(s): VITAMINB12, FOLATE, FERRITIN, TIBC, IRON, RETICCTPCT in the last 72 hours. Urinalysis    Component Value Date/Time   COLORURINE YELLOW (A) 02/27/2018 0741   APPEARANCEUR HAZY (A) 02/27/2018 0741   APPEARANCEUR Clear 09/29/2014 0014   LABSPEC 1.039 (H) 02/27/2018 0741   LABSPEC 1.005 09/29/2014 0014   PHURINE 5.0 02/27/2018 0741   GLUCOSEU NEGATIVE 02/27/2018 0741   GLUCOSEU Negative 09/29/2014 0014   HGBUR NEGATIVE 02/27/2018 0741   BILIRUBINUR neg 09/26/2021 1034   BILIRUBINUR Negative 09/29/2014 0014   KETONESUR 5 (A) 02/27/2018 0741   PROTEINUR Negative 09/26/2021 1034   PROTEINUR NEGATIVE 02/27/2018 0741   UROBILINOGEN 0.2 09/26/2021 1034   NITRITE neg 09/26/2021 1034   NITRITE NEGATIVE  02/27/2018 0741   LEUKOCYTESUR Negative 09/26/2021 1034   LEUKOCYTESUR Negative 09/29/2014 0014   Sepsis Labs Recent Labs  Lab 10/28/24 0539 10/29/24 0439  WBC 5.2 6.3   Microbiology No results found for this or any previous visit (from the past 240 hours).   Time coordinating discharge: 40 minutes  SIGNED:   Calvin KATHEE Robson, MD  [facility] 10/30/2024, 1:50 PM Pager   If 7PM-7AM, please contact night-coverage

## 2024-10-30 NOTE — Plan of Care (Signed)
  Problem: Education: Goal: Knowledge of General Education information will improve Description: Including pain rating scale, medication(s)/side effects and non-pharmacologic comfort measures 10/30/2024 1023 by Erick Oxendine K, RN Outcome: Adequate for Discharge 10/30/2024 0813 by Freeda Leon POUR, RN Outcome: Progressing   Problem: Health Behavior/Discharge Planning: Goal: Ability to manage health-related needs will improve 10/30/2024 1023 by Freeda Leon POUR, RN Outcome: Adequate for Discharge 10/30/2024 0813 by Freeda Leon POUR, RN Outcome: Progressing   Problem: Clinical Measurements: Goal: Ability to maintain clinical measurements within normal limits will improve 10/30/2024 1023 by Eagle Pitta K, RN Outcome: Adequate for Discharge 10/30/2024 0813 by Freeda Leon POUR, RN Outcome: Progressing Goal: Will remain free from infection 10/30/2024 1023 by Freeda Leon POUR, RN Outcome: Adequate for Discharge 10/30/2024 0813 by Freeda Leon POUR, RN Outcome: Progressing Goal: Diagnostic test results will improve 10/30/2024 1023 by Freeda Leon POUR, RN Outcome: Adequate for Discharge 10/30/2024 0813 by Freeda Leon POUR, RN Outcome: Progressing Goal: Respiratory complications will improve 10/30/2024 1023 by Loreley Schwall K, RN Outcome: Adequate for Discharge 10/30/2024 0813 by Rotha Cassels K, RN Outcome: Progressing Goal: Cardiovascular complication will be avoided 10/30/2024 1023 by Jed Kutch K, RN Outcome: Adequate for Discharge 10/30/2024 0813 by Mahamadou Weltz K, RN Outcome: Progressing   Problem: Activity: Goal: Risk for activity intolerance will decrease 10/30/2024 1023 by Freeda Leon POUR, RN Outcome: Adequate for Discharge 10/30/2024 0813 by Freeda Leon POUR, RN Outcome: Progressing   Problem: Nutrition: Goal: Adequate nutrition will be maintained 10/30/2024 1023 by Freeda Leon POUR, RN Outcome: Adequate for Discharge 10/30/2024 0813 by Freeda Leon POUR, RN Outcome: Progressing   Problem: Coping: Goal: Level of anxiety will decrease 10/30/2024 1023 by Freeda Leon POUR, RN Outcome: Adequate for Discharge 10/30/2024 0813 by Freeda Leon POUR, RN Outcome: Progressing   Problem: Elimination: Goal: Will not experience complications related to bowel motility 10/30/2024 1023 by Freeda Leon POUR, RN Outcome: Adequate for Discharge 10/30/2024 0813 by Freeda Leon POUR, RN Outcome: Progressing Goal: Will not experience complications related to urinary retention 10/30/2024 1023 by Freeda Leon POUR, RN Outcome: Adequate for Discharge 10/30/2024 0813 by Freeda Leon POUR, RN Outcome: Progressing   Problem: Pain Managment: Goal: General experience of comfort will improve and/or be controlled 10/30/2024 1023 by Ireland Chagnon K, RN Outcome: Adequate for Discharge 10/30/2024 0813 by Freeda Leon POUR, RN Outcome: Progressing   Problem: Safety: Goal: Ability to remain free from injury will improve 10/30/2024 1023 by Adryan Shin K, RN Outcome: Adequate for Discharge 10/30/2024 0813 by Freeda Leon POUR, RN Outcome: Progressing   Problem: Skin Integrity: Goal: Risk for impaired skin integrity will decrease 10/30/2024 1023 by Kenetra Hildenbrand K, RN Outcome: Adequate for Discharge 10/30/2024 0813 by Antwion Carpenter K, RN Outcome: Progressing

## 2024-10-30 NOTE — Progress Notes (Signed)
 OT Cancellation Note  Patient Details Name: Bianca Malone MRN: [id number] DOB: 07-18-1935   Cancelled Treatment:    Reason Eval/Treat Not Completed: OT screened, no needs identified, will sign off. Order received, chart reviewed. Per conversation with PT, pt back to baseline functional independence. No skilled OT needs identified. Will sign off. Please re-consult if additional needs arise.   Elston Slot, M.S. OTR/L  10/30/24, 9:35 AM  ascom [phone]

## 2024-10-30 NOTE — Evaluation (Signed)
 Physical Therapy Evaluation Patient Details Name: Bianca Malone MRN: [id number] DOB: 1935-11-20 Today's Date: 10/30/2024  History of Present Illness  89 y.o. female with medical history significant for hypertension, GERD, paroxysmal atrial fibrillation being admitted to the hospital with A-fib with RVR.  She has an ongoing history of symptomatic bradycardia which improved after discontinuation of metoprolol , as well as some difficulty tolerating diltiazem  due to dizziness, lightheadedness, weakness, and lower extremity edema.  Most recently, her diltiazem  was cut in half from 60 mg twice daily to 30 mg twice daily and overall she has been doing well.  She presented to the emergency department yesterday 11/25 with symptomatic RVR, she spontaneously converted to sinus rhythm and was asymptomatic after receiving 12.5 mg of Cardizem  with EMS.  ER provider discussed with on-call cardiology, recommended cutting diltiazem  dose even further to 30 mg p.o. daily, and plan for outpatient cardiology follow-up.  Patient returned home about 5 PM last night, she returns early this morning with complaints of nausea, dizziness and palpitations.  Found once again to be in rapid A-fib.  Clinical Impression  Pt is a pleasant 89 year old female who was admitted for Afib with RVR. Pt performs bed mobility with indep, transfers with mod I, and ambulation with supervision with RW. Pt demonstrates all bed mobility/transfers/ambulation at baseline level. Pt does not require any further PT needs at this time. Pt will be dc in house and does not require follow up. RN aware. Will dc current orders.        If plan is discharge home, recommend the following:     Can travel by private vehicle        Equipment Recommendations None recommended by PT  Recommendations for Other Services       Functional Status Assessment Patient has not had a recent decline in their functional status     Precautions / Restrictions  Precautions Precautions: Fall Recall of Precautions/Restrictions: Intact Precaution/Restrictions Comments: per notes, no further sternal precuations, however no lifting >10lb Restrictions Weight Bearing Restrictions Per Provider Order: No      Mobility  Bed Mobility Overal bed mobility: Independent             General bed mobility comments: safe technique    Transfers Overall transfer level: Modified independent Equipment used: Rolling walker (2 wheels)               General transfer comment: safe technique with upright posture. HR at 62bpm initially    Ambulation/Gait Ambulation/Gait assistance: Supervision Gait Distance (Feet): 150 Feet Assistive device: Rolling walker (2 wheels) Gait Pattern/deviations: Step-through pattern       General Gait Details: ambulated around using RW per patient request. Reciprocal gait pattern performed. HR monitored and went up to 82bpm with exertion  Stairs            Wheelchair Mobility     Tilt Bed    Modified Rankin (Stroke Patients Only)       Balance Overall balance assessment: Modified Independent                                           Pertinent Vitals/Pain Pain Assessment Pain Assessment: No/denies pain    Home Living Family/patient expects to be discharged to:: Private residence Living Arrangements: Alone Available Help at Discharge: Family;Available PRN/intermittently Type of Home: House Home Access: Stairs to enter Entrance Stairs-Rails:  Can reach both Entrance Stairs-Number of Steps: 3 Alternate Level Stairs-Number of Steps: flight- has stair lift Home Layout: Two level;Bed/bath upstairs Home Equipment: Rollator (4 wheels)      Prior Function Prior Level of Function : Independent/Modified Independent             Mobility Comments: indep with all mobility, reports no use of AD, no falls ADLs Comments: indep     Extremity/Trunk Assessment   Upper Extremity  Assessment Upper Extremity Assessment: Overall WFL for tasks assessed    Lower Extremity Assessment Lower Extremity Assessment: Generalized weakness       Communication   Communication Communication: No apparent difficulties    Cognition Arousal: Alert Behavior During Therapy: WFL for tasks assessed/performed   PT - Cognitive impairments: No apparent impairments                       PT - Cognition Comments: pleasant and agreeable to session Following commands: Intact       Cueing Cueing Techniques: Verbal cues     General Comments      Exercises     Assessment/Plan    PT Assessment Patient does not need any further PT services  PT Problem List         PT Treatment Interventions      PT Goals (Current goals can be found in the Care Plan section)  Acute Rehab PT Goals Patient Stated Goal: to go home PT Goal Formulation: All assessment and education complete, DC therapy Time For Goal Achievement: 10/30/24 Potential to Achieve Goals: Good    Frequency       Co-evaluation               AM-PAC PT 6 Clicks Mobility  Outcome Measure Help needed turning from your back to your side while in a flat bed without using bedrails?: None Help needed moving from lying on your back to sitting on the side of a flat bed without using bedrails?: None Help needed moving to and from a bed to a chair (including a wheelchair)?: None Help needed standing up from a chair using your arms (e.g., wheelchair or bedside chair)?: None Help needed to walk in hospital room?: None Help needed climbing 3-5 steps with a railing? : None 6 Click Score: 24    End of Session   Activity Tolerance: Patient tolerated treatment well Patient left: in chair;with nursing/sitter in room;with family/visitor present Nurse Communication: Mobility status PT Visit Diagnosis: Muscle weakness (generalized) (M62.81)    Time: 9152-9095 PT Time Calculation (min) (ACUTE ONLY): 17  min   Charges:   PT Evaluation $PT Eval Low Complexity: 1 Low PT Treatments $Gait Training: 8-22 mins PT General Charges $$ ACUTE PT VISIT: 1 Visit         Corean Dade, PT, DPT, GCS [phone]   Vyom Brass 10/30/2024, 10:21 AM

## 2024-10-30 NOTE — Progress Notes (Signed)
 Reviewed discharge instructions with pt and daughter at bedside. Both verbalize understanding. IV removed. Tele removed. Pt has belongings

## 2024-10-30 NOTE — TOC CM/SW Note (Signed)
 Transition of Care [facility]) CM/SW Note    Transition of Care [facility]) - Inpatient Brief Assessment   Patient Details  Name: Bianca Malone MRN: [id number] Date of Birth: 1935-05-12  Transition of Care [facility]) CM/SW Contact:    Alfonso Rummer, LCSW Phone Number: 10/30/2024, 9:02 AM   Clinical Narrative:  Bianca Malone Rummer completed TOC chart review. No TOC needs identified please contact TOC should needs arise.   Transition of Care Asessment: Insurance and Status: Insurance coverage has been reviewed Patient has primary care physician: Yes (GILBERT, RICHARD L) Home environment has been reviewed: single family home   Prior/Current Home Services: No current home services Social Drivers of Health Review: SDOH reviewed no interventions necessary Readmission risk has been reviewed: No Transition of care needs: no transition of care needs at this time

## 2025-05-25 ENCOUNTER — Encounter: Admitting: Dermatology
